# Patient Record
Sex: Female | Born: 1970 | Race: White | Hispanic: No | Marital: Married | Smoking: Current every day smoker
Health system: Southern US, Community
[De-identification: ages and names within clinical notes are randomized; demographics above are authoritative.]

## PROBLEM LIST (undated history)

## (undated) DIAGNOSIS — I2510 Atherosclerotic heart disease of native coronary artery without angina pectoris: Secondary | ICD-10-CM

## (undated) DIAGNOSIS — C801 Malignant (primary) neoplasm, unspecified: Secondary | ICD-10-CM

## (undated) DIAGNOSIS — J45909 Unspecified asthma, uncomplicated: Secondary | ICD-10-CM

## (undated) DIAGNOSIS — C50419 Malignant neoplasm of upper-outer quadrant of unspecified female breast: Secondary | ICD-10-CM

## (undated) DIAGNOSIS — I10 Essential (primary) hypertension: Secondary | ICD-10-CM

## (undated) DIAGNOSIS — N63 Unspecified lump in unspecified breast: Secondary | ICD-10-CM

## (undated) DIAGNOSIS — I219 Acute myocardial infarction, unspecified: Secondary | ICD-10-CM

## (undated) DIAGNOSIS — C50919 Malignant neoplasm of unspecified site of unspecified female breast: Secondary | ICD-10-CM

## (undated) DIAGNOSIS — Z87891 Personal history of nicotine dependence: Secondary | ICD-10-CM

## (undated) HISTORY — PX: CORONARY ANGIOPLASTY WITH STENT PLACEMENT: SHX49

## (undated) HISTORY — DX: Personal history of nicotine dependence: Z87.891

## (undated) HISTORY — PX: PORTACATH PLACEMENT: SHX2246

## (undated) HISTORY — DX: Malignant neoplasm of upper-outer quadrant of unspecified female breast: C50.419

## (undated) HISTORY — DX: Acute myocardial infarction, unspecified: I21.9

## (undated) HISTORY — DX: Atherosclerotic heart disease of native coronary artery without angina pectoris: I25.10

## (undated) HISTORY — DX: Essential (primary) hypertension: I10

## (undated) HISTORY — DX: Unspecified asthma, uncomplicated: J45.909

## (undated) HISTORY — DX: Malignant neoplasm of unspecified site of unspecified female breast: C50.919

## (undated) HISTORY — DX: Unspecified lump in unspecified breast: N63.0

---
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
## 2002-11-08 HISTORY — PX: TUBAL LIGATION: SHX77

## 2003-11-09 DIAGNOSIS — J45909 Unspecified asthma, uncomplicated: Secondary | ICD-10-CM

## 2003-11-09 HISTORY — DX: Unspecified asthma, uncomplicated: J45.909

## 2010-11-08 DIAGNOSIS — C801 Malignant (primary) neoplasm, unspecified: Secondary | ICD-10-CM

## 2010-11-08 DIAGNOSIS — I10 Essential (primary) hypertension: Secondary | ICD-10-CM

## 2010-11-08 DIAGNOSIS — C50419 Malignant neoplasm of upper-outer quadrant of unspecified female breast: Secondary | ICD-10-CM

## 2010-11-08 DIAGNOSIS — N63 Unspecified lump in unspecified breast: Secondary | ICD-10-CM

## 2010-11-08 HISTORY — DX: Malignant neoplasm of upper-outer quadrant of unspecified female breast: C50.419

## 2010-11-08 HISTORY — DX: Unspecified lump in unspecified breast: N63.0

## 2010-11-08 HISTORY — PX: BREAST LUMPECTOMY: SHX2

## 2010-11-08 HISTORY — DX: Essential (primary) hypertension: I10

## 2010-11-08 HISTORY — DX: Malignant (primary) neoplasm, unspecified: C80.1

## 2010-12-23 ENCOUNTER — Ambulatory Visit: Payer: Self-pay

## 2010-12-31 ENCOUNTER — Other Ambulatory Visit: Payer: Self-pay | Admitting: General Surgery

## 2011-01-04 ENCOUNTER — Ambulatory Visit: Payer: Self-pay | Admitting: Oncology

## 2011-01-04 LAB — PATHOLOGY REPORT

## 2011-01-07 ENCOUNTER — Ambulatory Visit: Payer: Self-pay | Admitting: Oncology

## 2011-01-07 ENCOUNTER — Other Ambulatory Visit: Payer: Self-pay | Admitting: *Deleted

## 2011-01-07 ENCOUNTER — Other Ambulatory Visit: Payer: Self-pay | Admitting: Oncology

## 2011-01-07 DIAGNOSIS — C50911 Malignant neoplasm of unspecified site of right female breast: Secondary | ICD-10-CM

## 2011-01-12 ENCOUNTER — Ambulatory Visit
Admission: RE | Admit: 2011-01-12 | Discharge: 2011-01-12 | Disposition: A | Discharge status: Home / Self Care | Payer: Self-pay | Source: Ambulatory Visit | Attending: Oncology | Admitting: Oncology

## 2011-01-12 DIAGNOSIS — C50911 Malignant neoplasm of unspecified site of right female breast: Secondary | ICD-10-CM

## 2011-01-12 MED ORDER — GADOBENATE DIMEGLUMINE 529 MG/ML IV SOLN
11.0000 mL | Freq: Once | INTRAVENOUS | Status: AC | PRN
  Administered 2011-01-12: 11 mL via INTRAVENOUS

## 2011-01-20 ENCOUNTER — Ambulatory Visit: Payer: Self-pay | Admitting: General Surgery

## 2011-02-07 ENCOUNTER — Ambulatory Visit: Payer: Self-pay | Admitting: Oncology

## 2011-03-09 ENCOUNTER — Ambulatory Visit: Payer: Self-pay | Admitting: Oncology

## 2011-04-09 ENCOUNTER — Ambulatory Visit: Payer: Self-pay | Admitting: Oncology

## 2011-05-09 ENCOUNTER — Ambulatory Visit: Payer: Self-pay | Admitting: Oncology

## 2011-06-09 ENCOUNTER — Ambulatory Visit: Payer: Self-pay | Admitting: Oncology

## 2011-06-14 ENCOUNTER — Ambulatory Visit: Payer: Self-pay | Admitting: Oncology

## 2011-06-28 ENCOUNTER — Ambulatory Visit: Payer: Self-pay | Admitting: General Surgery

## 2011-07-06 LAB — PATHOLOGY REPORT

## 2011-07-10 ENCOUNTER — Ambulatory Visit: Payer: Self-pay | Admitting: Oncology

## 2011-08-09 ENCOUNTER — Ambulatory Visit: Payer: Self-pay | Admitting: Oncology

## 2011-09-09 ENCOUNTER — Ambulatory Visit: Payer: Self-pay | Admitting: Oncology

## 2011-10-09 ENCOUNTER — Ambulatory Visit: Payer: Self-pay | Admitting: Oncology

## 2011-11-09 ENCOUNTER — Ambulatory Visit: Payer: Self-pay | Admitting: Oncology

## 2011-12-15 ENCOUNTER — Ambulatory Visit: Payer: Self-pay

## 2012-04-21 ENCOUNTER — Ambulatory Visit: Payer: Self-pay | Admitting: Oncology

## 2012-05-08 ENCOUNTER — Ambulatory Visit: Payer: Self-pay | Admitting: Oncology

## 2012-06-20 ENCOUNTER — Ambulatory Visit: Payer: Self-pay | Admitting: General Surgery

## 2012-08-08 ENCOUNTER — Ambulatory Visit: Payer: Self-pay | Admitting: Oncology

## 2012-10-08 DIAGNOSIS — I219 Acute myocardial infarction, unspecified: Secondary | ICD-10-CM | POA: Insufficient documentation

## 2012-10-08 HISTORY — DX: Acute myocardial infarction, unspecified: I21.9

## 2012-10-16 ENCOUNTER — Emergency Department (HOSPITAL_COMMUNITY): Payer: Medicaid Other

## 2012-10-16 ENCOUNTER — Ambulatory Visit (HOSPITAL_COMMUNITY): Admit: 2012-10-16 | Payer: Self-pay | Admitting: Cardiology

## 2012-10-16 ENCOUNTER — Encounter (HOSPITAL_COMMUNITY): Payer: Self-pay

## 2012-10-16 ENCOUNTER — Encounter (HOSPITAL_COMMUNITY)
Admission: EM | Disposition: A | Discharge status: Home / Self Care | Payer: Self-pay | Source: Home / Self Care | Attending: Cardiology

## 2012-10-16 ENCOUNTER — Inpatient Hospital Stay (HOSPITAL_COMMUNITY)
Admission: EM | Admit: 2012-10-16 | Discharge: 2012-10-19 | DRG: 247 | Disposition: A | Discharge status: Home / Self Care | Payer: Medicaid Other | Attending: Cardiology | Admitting: Cardiology

## 2012-10-16 DIAGNOSIS — E7800 Pure hypercholesterolemia, unspecified: Secondary | ICD-10-CM | POA: Diagnosis present

## 2012-10-16 DIAGNOSIS — Y840 Cardiac catheterization as the cause of abnormal reaction of the patient, or of later complication, without mention of misadventure at the time of the procedure: Secondary | ICD-10-CM | POA: Diagnosis not present

## 2012-10-16 DIAGNOSIS — F17200 Nicotine dependence, unspecified, uncomplicated: Secondary | ICD-10-CM | POA: Diagnosis present

## 2012-10-16 DIAGNOSIS — R000 Tachycardia, unspecified: Secondary | ICD-10-CM | POA: Diagnosis present

## 2012-10-16 DIAGNOSIS — I2109 ST elevation (STEMI) myocardial infarction involving other coronary artery of anterior wall: Principal | ICD-10-CM | POA: Diagnosis present

## 2012-10-16 DIAGNOSIS — Z853 Personal history of malignant neoplasm of breast: Secondary | ICD-10-CM

## 2012-10-16 DIAGNOSIS — I10 Essential (primary) hypertension: Secondary | ICD-10-CM | POA: Diagnosis present

## 2012-10-16 DIAGNOSIS — I959 Hypotension, unspecified: Secondary | ICD-10-CM | POA: Diagnosis present

## 2012-10-16 DIAGNOSIS — Z880 Allergy status to penicillin: Secondary | ICD-10-CM

## 2012-10-16 DIAGNOSIS — I219 Acute myocardial infarction, unspecified: Secondary | ICD-10-CM

## 2012-10-16 DIAGNOSIS — D62 Acute posthemorrhagic anemia: Secondary | ICD-10-CM | POA: Diagnosis not present

## 2012-10-16 DIAGNOSIS — Z955 Presence of coronary angioplasty implant and graft: Secondary | ICD-10-CM

## 2012-10-16 DIAGNOSIS — Y9210 Unspecified residential institution as the place of occurrence of the external cause: Secondary | ICD-10-CM | POA: Diagnosis not present

## 2012-10-16 DIAGNOSIS — IMO0002 Reserved for concepts with insufficient information to code with codable children: Secondary | ICD-10-CM | POA: Diagnosis not present

## 2012-10-16 DIAGNOSIS — I213 ST elevation (STEMI) myocardial infarction of unspecified site: Secondary | ICD-10-CM

## 2012-10-16 DIAGNOSIS — E119 Type 2 diabetes mellitus without complications: Secondary | ICD-10-CM | POA: Diagnosis present

## 2012-10-16 HISTORY — DX: Malignant (primary) neoplasm, unspecified: C80.1

## 2012-10-16 HISTORY — PX: LEFT HEART CATHETERIZATION WITH CORONARY ANGIOGRAM: SHX5451

## 2012-10-16 LAB — BASIC METABOLIC PANEL
BUN: 15 mg/dL (ref 6–23)
Calcium: 7.7 mg/dL — ABNORMAL LOW (ref 8.4–10.5)
Chloride: 98 mEq/L (ref 96–112)
GFR calc Af Amer: 85 mL/min — ABNORMAL LOW (ref >90)
GFR calc Af Amer: >90 mL/min (ref >90)
GFR calc non Af Amer: 73 mL/min — ABNORMAL LOW (ref >90)
GFR calc non Af Amer: >90 mL/min (ref >90)
Glucose, Bld: 161 mg/dL — ABNORMAL HIGH (ref 70–99)
Glucose, Bld: 231 mg/dL — ABNORMAL HIGH (ref 70–99)
Potassium: 3.7 mEq/L (ref 3.5–5.1)
Potassium: 4.4 mEq/L (ref 3.5–5.1)
Sodium: 139 mEq/L (ref 135–145)
Sodium: 137 mEq/L (ref 135–145)

## 2012-10-16 LAB — CBC
Hemoglobin: 10.6 g/dL — ABNORMAL LOW (ref 12.0–15.0)
MCH: 32.6 pg (ref 26.0–34.0)
MCHC: 34.6 g/dL (ref 30.0–36.0)
RDW: 14.2 % (ref 11.5–15.5)

## 2012-10-16 LAB — CBC WITH DIFFERENTIAL/PLATELET
Basophils Absolute: 0 10*3/uL (ref 0.0–0.1)
Basophils Relative: 0 % (ref 0–1)
Basophils Relative: 0 % (ref 0–1)
Eosinophils Absolute: 0 10*3/uL (ref 0.0–0.7)
Eosinophils Absolute: 0 10*3/uL (ref 0.0–0.7)
Lymphs Abs: 1.6 10*3/uL (ref 0.7–4.0)
MCH: 32.8 pg (ref 26.0–34.0)
MCH: 32.2 pg (ref 26.0–34.0)
MCHC: 35.2 g/dL (ref 30.0–36.0)
Monocytes Relative: 5 % (ref 3–12)
Neutro Abs: 11.5 10*3/uL — ABNORMAL HIGH (ref 1.7–7.7)
Neutro Abs: 12.8 10*3/uL — ABNORMAL HIGH (ref 1.7–7.7)
Neutrophils Relative %: 82 % — ABNORMAL HIGH (ref 43–77)
Neutrophils Relative %: 86 % — ABNORMAL HIGH (ref 43–77)
Platelets: 277 10*3/uL (ref 150–400)
Platelets: 288 10*3/uL (ref 150–400)
RBC: 5.06 MIL/uL (ref 3.87–5.11)
RDW: 13.9 % (ref 11.5–15.5)

## 2012-10-16 LAB — TROPONIN I
Troponin I: >20 ng/mL (ref <0.30)
Troponin I: >20 ng/mL (ref <0.30)

## 2012-10-16 LAB — POCT ACTIVATED CLOTTING TIME: Activated Clotting Time: 404 seconds

## 2012-10-16 LAB — MAGNESIUM: Magnesium: 2 mg/dL (ref 1.5–2.5)

## 2012-10-16 LAB — PROTIME-INR
INR: 1.47 (ref 0.00–1.49)
Prothrombin Time: 17.4 seconds — ABNORMAL HIGH (ref 11.6–15.2)

## 2012-10-16 LAB — POCT I-STAT TROPONIN I: Troponin i, poc: 1.91 ng/mL (ref 0.00–0.08)

## 2012-10-16 LAB — APTT: aPTT: 84 seconds — ABNORMAL HIGH (ref 24–37)

## 2012-10-16 SURGERY — LEFT HEART CATHETERIZATION WITH CORONARY ANGIOGRAM
Anesthesia: LOCAL

## 2012-10-16 MED ORDER — LIDOCAINE HCL (PF) 1 % IJ SOLN
INTRAMUSCULAR | Status: AC
  Filled 2012-10-16: qty 30

## 2012-10-16 MED ORDER — ONDANSETRON HCL 4 MG/2ML IJ SOLN
INTRAMUSCULAR | Status: AC
  Filled 2012-10-16: qty 2

## 2012-10-16 MED ORDER — MORPHINE SULFATE 4 MG/ML IJ SOLN
4.0000 mg | Freq: Once | INTRAMUSCULAR | Status: AC
  Administered 2012-10-16: 4 mg via INTRAVENOUS

## 2012-10-16 MED ORDER — METOPROLOL TARTRATE 50 MG PO TABS
50.0000 mg | ORAL_TABLET | Freq: Once | ORAL | Status: AC
  Administered 2012-10-16: 50 mg via ORAL
  Filled 2012-10-16: qty 1

## 2012-10-16 MED ORDER — ASPIRIN 81 MG PO CHEW
CHEWABLE_TABLET | ORAL | Status: AC
  Administered 2012-10-16: 324 mg
  Filled 2012-10-16: qty 4

## 2012-10-16 MED ORDER — INSULIN ASPART 100 UNIT/ML ~~LOC~~ SOLN: 0.0000 [IU] | Freq: Three times a day (TID) | SUBCUTANEOUS | Status: DC

## 2012-10-16 MED ORDER — FENTANYL CITRATE 0.05 MG/ML IJ SOLN
INTRAMUSCULAR | Status: AC
  Filled 2012-10-16: qty 2

## 2012-10-16 MED ORDER — METOPROLOL TARTRATE 1 MG/ML IV SOLN
5.0000 mg | Freq: Once | INTRAVENOUS | Status: AC
  Administered 2012-10-16: 5 mg via INTRAVENOUS
  Filled 2012-10-16: qty 5

## 2012-10-16 MED ORDER — NITROGLYCERIN IN D5W 200-5 MCG/ML-% IV SOLN: 5.0000 ug/min | INTRAVENOUS | Status: DC

## 2012-10-16 MED ORDER — TICAGRELOR 90 MG PO TABS
ORAL_TABLET | ORAL | Status: AC
  Administered 2012-10-17: 90 mg via ORAL
  Filled 2012-10-16: qty 2

## 2012-10-16 MED ORDER — PANTOPRAZOLE SODIUM 40 MG PO TBEC
40.0000 mg | DELAYED_RELEASE_TABLET | Freq: Every day | ORAL | Status: DC
  Administered 2012-10-17 – 2012-10-19 (×3): 40 mg via ORAL
  Filled 2012-10-16 (×3): qty 1

## 2012-10-16 MED ORDER — HEPARIN (PORCINE) IN NACL 2-0.9 UNIT/ML-% IJ SOLN
INTRAMUSCULAR | Status: AC
  Filled 2012-10-16: qty 1000

## 2012-10-16 MED ORDER — HEPARIN (PORCINE) IN NACL 100-0.45 UNIT/ML-% IJ SOLN
1000.0000 [IU]/h | INTRAMUSCULAR | Status: DC
  Administered 2012-10-16: 1000 [IU]/h via INTRAVENOUS
  Filled 2012-10-16: qty 250

## 2012-10-16 MED ORDER — TICAGRELOR 90 MG PO TABS
90.0000 mg | ORAL_TABLET | Freq: Two times a day (BID) | ORAL | Status: DC
  Administered 2012-10-16 – 2012-10-19 (×6): 90 mg via ORAL
  Filled 2012-10-16 (×7): qty 1

## 2012-10-16 MED ORDER — METOPROLOL TARTRATE 12.5 MG HALF TABLET
12.5000 mg | ORAL_TABLET | Freq: Two times a day (BID) | ORAL | Status: DC
  Administered 2012-10-16 – 2012-10-19 (×6): 12.5 mg via ORAL
  Filled 2012-10-16 (×8): qty 1

## 2012-10-16 MED ORDER — SODIUM CHLORIDE 0.9 % IV BOLUS (SEPSIS)
500.0000 mL | Freq: Once | INTRAVENOUS | Status: AC
  Administered 2012-10-16: 500 mL via INTRAVENOUS

## 2012-10-16 MED ORDER — VANCOMYCIN HCL IN DEXTROSE 1-5 GM/200ML-% IV SOLN
1000.0000 mg | Freq: Once | INTRAVENOUS | Status: AC
  Administered 2012-10-16: 1000 mg via INTRAVENOUS
  Filled 2012-10-16: qty 200

## 2012-10-16 MED ORDER — ONDANSETRON HCL 4 MG/2ML IJ SOLN: 4.0000 mg | Freq: Four times a day (QID) | INTRAMUSCULAR | Status: DC | PRN

## 2012-10-16 MED ORDER — SODIUM CHLORIDE 0.9 % IV SOLN
Freq: Once | INTRAVENOUS | Status: AC
  Administered 2012-10-16: 1000 mL via INTRAVENOUS

## 2012-10-16 MED ORDER — ACETAMINOPHEN 325 MG PO TABS: 650.0000 mg | ORAL_TABLET | ORAL | Status: DC | PRN

## 2012-10-16 MED ORDER — ASPIRIN 325 MG PO TABS: 324.0000 mg | ORAL_TABLET | Freq: Once | ORAL | Status: DC

## 2012-10-16 MED ORDER — PROMETHAZINE HCL 25 MG/ML IJ SOLN
25.0000 mg | Freq: Once | INTRAMUSCULAR | Status: AC
  Administered 2012-10-16 (×2): 12.5 mg via INTRAVENOUS

## 2012-10-16 MED ORDER — SODIUM CHLORIDE 0.9 % IV SOLN
INTRAVENOUS | Status: AC
  Administered 2012-10-16: 150 mL/h via INTRAVENOUS

## 2012-10-16 MED ORDER — MORPHINE SULFATE 2 MG/ML IJ SOLN
INTRAMUSCULAR | Status: AC
  Filled 2012-10-16: qty 1

## 2012-10-16 MED ORDER — HEPARIN BOLUS VIA INFUSION
4000.0000 [IU] | Freq: Once | INTRAVENOUS | Status: AC
  Administered 2012-10-16: 4000 [IU] via INTRAVENOUS

## 2012-10-16 MED ORDER — MORPHINE SULFATE 2 MG/ML IJ SOLN
2.0000 mg | INTRAMUSCULAR | Status: DC | PRN
  Administered 2012-10-16: 2 mg via INTRAVENOUS

## 2012-10-16 MED ORDER — EPTIFIBATIDE 75 MG/100ML IV SOLN
INTRAVENOUS | Status: AC
  Filled 2012-10-16: qty 100

## 2012-10-16 MED ORDER — PROMETHAZINE HCL 25 MG/ML IJ SOLN: 12.5000 mg | Freq: Three times a day (TID) | INTRAMUSCULAR | Status: DC | PRN

## 2012-10-16 MED ORDER — ATORVASTATIN CALCIUM 80 MG PO TABS
80.0000 mg | ORAL_TABLET | Freq: Every day | ORAL | Status: DC
  Administered 2012-10-16 – 2012-10-18 (×3): 80 mg via ORAL
  Filled 2012-10-16 (×4): qty 1

## 2012-10-16 MED ORDER — MORPHINE SULFATE 4 MG/ML IJ SOLN
INTRAMUSCULAR | Status: AC
  Administered 2012-10-16: 4 mg via INTRAVENOUS
  Filled 2012-10-16: qty 1

## 2012-10-16 MED ORDER — NITROGLYCERIN IN D5W 200-5 MCG/ML-% IV SOLN
5.0000 ug/min | INTRAVENOUS | Status: DC
  Administered 2012-10-16: 5 ug/min via INTRAVENOUS

## 2012-10-16 MED ORDER — CLOPIDOGREL BISULFATE 300 MG PO TABS
300.0000 mg | ORAL_TABLET | Freq: Once | ORAL | Status: AC
  Administered 2012-10-16: 300 mg via ORAL
  Filled 2012-10-16: qty 1

## 2012-10-16 MED ORDER — METOPROLOL TARTRATE 1 MG/ML IV SOLN
5.0000 mg | INTRAVENOUS | Status: AC
  Administered 2012-10-16 (×2): 2.5 mg via INTRAVENOUS
  Filled 2012-10-16: qty 5

## 2012-10-16 MED ORDER — NITROGLYCERIN 0.2 MG/ML ON CALL CATH LAB
INTRAVENOUS | Status: AC
  Filled 2012-10-16: qty 1

## 2012-10-16 MED ORDER — NITROGLYCERIN IN D5W 200-5 MCG/ML-% IV SOLN
INTRAVENOUS | Status: AC
  Filled 2012-10-16: qty 250

## 2012-10-16 MED ORDER — BIVALIRUDIN 250 MG IV SOLR
INTRAVENOUS | Status: AC
  Filled 2012-10-16: qty 250

## 2012-10-16 MED ORDER — ALPRAZOLAM 0.25 MG PO TABS
0.2500 mg | ORAL_TABLET | Freq: Two times a day (BID) | ORAL | Status: DC | PRN
  Administered 2012-10-17 – 2012-10-18 (×3): 0.25 mg via ORAL
  Filled 2012-10-16 (×3): qty 1

## 2012-10-16 MED ORDER — SODIUM CHLORIDE 0.9 % IV SOLN
Freq: Once | INTRAVENOUS | Status: AC
  Administered 2012-10-16: 13:00:00 via INTRAVENOUS

## 2012-10-16 MED ORDER — NITROGLYCERIN 0.4 MG SL SUBL: 0.4000 mg | SUBLINGUAL_TABLET | SUBLINGUAL | Status: DC | PRN

## 2012-10-16 MED ORDER — SODIUM CHLORIDE 0.9 % IV BOLUS (SEPSIS)
250.0000 mL | Freq: Once | INTRAVENOUS | Status: AC
  Administered 2012-10-16: 250 mL via INTRAVENOUS

## 2012-10-16 MED ORDER — INSULIN ASPART 100 UNIT/ML ~~LOC~~ SOLN: 0.0000 [IU] | Freq: Every day | SUBCUTANEOUS | Status: DC

## 2012-10-16 MED ORDER — ASPIRIN EC 81 MG PO TBEC
81.0000 mg | DELAYED_RELEASE_TABLET | Freq: Every day | ORAL | Status: DC
  Administered 2012-10-17 – 2012-10-19 (×3): 81 mg via ORAL
  Filled 2012-10-16 (×3): qty 1

## 2012-10-16 MED ORDER — MIDAZOLAM HCL 2 MG/2ML IJ SOLN
INTRAMUSCULAR | Status: AC
  Filled 2012-10-16: qty 2

## 2012-10-16 NOTE — Progress Notes (Signed)
   CARE MANAGEMENT NOTE 10/16/2012  Patient:  [name],[name]   Account Number:  [id number]  Date Initiated:  10/16/2012  Documentation initiated by:  Junius Creamer  Subjective/Objective Assessment:   adm w mi     Action/Plan:   lives w spouse   Anticipated DC Date:     Anticipated DC Plan:        DC Planning Services  CM consult      Choice offered to / List presented to:             Status of service:   Medicare Important Message given?   (If response is "NO", the following Medicare IM given date fields will be blank) Date Medicare IM given:   Date Additional Medicare IM given:    Discharge Disposition:  HOME/SELF CARE  Per UR Regulation:  Reviewed for med. necessity/level of care/duration of stay  If discussed at Long Length of Stay Meetings, dates discussed:    Comments:  12/9 12:21p debbie Sweetie Giebler rn,bsn [phone] will give pt brilinta 30day free card and copay assist card.

## 2012-10-16 NOTE — Progress Notes (Signed)
Order for sheath removal verified per post procedural orders. Procedure explained to patient and Rt femoral artery access site assessed: level 0, palpable dorsalis pedis and posterior tibial pulses. 6 French Sheath removed and manual pressure applied for 40 minutes. Pre, peri, & post procedural vitals: HR 120's, RR 14, O2 Sat upper 96, BP 122/70: Pt complains of right flank and abdominal pain. Dr. Sharyn Lull at [facility]. Distal pulses remained intact after sheath removal. Access site level 0 and dressed with 4X4 gauze and tegaderm.  Solmon Ice, RN confirmed condition of site. Post procedural instructions discussed with return demonstration from patient.

## 2012-10-16 NOTE — ED Notes (Signed)
Critical labs called to rn, md notified ([email or web address]). Cath lab also notified. Reported to [place]

## 2012-10-16 NOTE — ED Provider Notes (Signed)
History   This chart was scribed for Karen Lyons, MD by [name], ED Scribe. The patient was seen in room APA08/APA08. Patient's care was started at 0911.   CSN: [id number]  Arrival date & time 10/16/12  0900   First MD Initiated Contact with Patient 10/16/12 0911      Chief Complaint  Patient presents with  . Chest Pain    The history is provided by the patient. No language interpreter was used.  [name] is a 41 y.o. female who presents to the Emergency Department complaining of intermittent, moderate chest pain that started 2 days that worsened over the past 2 hours. She states the pain woke her up from her sleep. She describes the chest pain as pressure and radiates to her left arm. She reports associated nausea, vomiting, and SOB. She has a h/o breast cancer with her last chemotherapy treatment a year ago. She denies any cardiac hx. She denies any h/o DM, HTN.  Past Medical History  Diagnosis Date  . Cancer     breast cancer    Past Surgical History  Procedure Date  . Breast lumpectomy   . Portacath placement   . Cesarean section     No family history on file.  History  Substance Use Topics  . Smoking status: Current Every Day Smoker  . Smokeless tobacco: Not on file  . Alcohol Use: No    OB History    Grav Para Term Preterm Abortions TAB SAB Ect Mult Living                  Review of Systems  Respiratory: Positive for shortness of breath.   Cardiovascular: Positive for chest pain.  Gastrointestinal: Positive for nausea and vomiting.  All other systems reviewed and are negative.    Allergies  Codeine and Penicillins  Home Medications  No current outpatient prescriptions on file.  BP 180/125  Pulse 95  Temp 98.1 F (36.7 C) (Axillary)  Resp 22  Ht 4\' 10"  (1.473 m)  Wt 125 lb (56.7 kg)  BMI 26.13 kg/m2  SpO2 100%  Physical Exam  Nursing note and vitals reviewed. Constitutional: She is oriented to person, place, and time. She  appears well-developed and well-nourished. She appears distressed.       Appears anxious and uncomfortable.   HENT:  Head: Normocephalic and atraumatic.  Eyes: EOM are normal. Pupils are equal, round, and reactive to light.  Neck: Normal range of motion. Neck supple. No tracheal deviation present.  Cardiovascular: Regular rhythm and normal heart sounds.  Tachycardia present.   No murmur heard. Pulmonary/Chest: Effort normal and breath sounds normal. No respiratory distress. She has no wheezes.  Abdominal: Soft. Bowel sounds are normal. She exhibits no distension. There is no tenderness.  Musculoskeletal: Normal range of motion. She exhibits no edema.  Neurological: She is alert and oriented to person, place, and time.  Skin: Skin is warm and dry.  Psychiatric: She has a normal mood and affect. Her behavior is normal.    ED Course  Procedures (including critical care time)  DIAGNOSTIC STUDIES: Oxygen Saturation is 100% on 2 L [place], normal by my interpretation.    COORDINATION OF CARE:  09:15-Informed pt of EKG findings, called code STEMI. Discussed planned course of treatment with the patient including aspirin, nitroglycerin, heparin and transferring to [facility], who is agreeable at this time.   09:22-Medication Orders: Nitroglycerin 0.2 mg/mL in dextrose 5% infusion; Aspirin 81 mg chewable tablet.  Labs Reviewed  CBC WITH DIFFERENTIAL  BASIC METABOLIC PANEL  TROPONIN I   No results found.   No diagnosis found.   Date: 10/16/2012  Rate: 106  Rhythm: sinus tachycardia  QRS Axis: left  Intervals: normal  ST/T Wave abnormalities: acute myocardial infarction  Conduction Disutrbances:none  Narrative Interpretation:   Old EKG Reviewed: none available    MDM  The patient presents with chest pain since last night, ekg this AM consistent with anteroseptal MI.  Code STEMI called and heparin, plavix, asa, oxygen, morphine, ntg drip, lopressor all given.  EMS called to take  patient to [facility].  I have spoken with Dr. Sharyn Lull who agrees to accept the patient in transfer.  He has recommended brylinta but I have been informed we do not have this here.  Plavix given in lieu of this.   CRITICAL CARE Performed by: [name]   Total critical care time: 30 minutes  Critical care time was exclusive of separately billable procedures and treating other patients.  Critical care was necessary to treat or prevent imminent or life-threatening deterioration.  Critical care was time spent personally by me on the following activities: development of treatment plan with patient and/or surrogate as well as nursing, discussions with consultants, evaluation of patient's response to treatment, examination of patient, obtaining history from patient or surrogate, ordering and performing treatments and interventions, ordering and review of laboratory studies, ordering and review of radiographic studies, pulse oximetry and re-evaluation of patient's condition.    I personally performed the services described in this documentation, which was scribed in my presence. The recorded information has been reviewed and is accurate.         Karen Lyons, MD 10/16/12 [phone]

## 2012-10-16 NOTE — ED Notes (Signed)
MD at bedside. 

## 2012-10-16 NOTE — CV Procedure (Signed)
Left cardiac cath/PTCA stenting report dictated on 10/16/2012 dictation number is [id number]

## 2012-10-16 NOTE — ED Notes (Signed)
[facility] called for Code Stemi.

## 2012-10-16 NOTE — ED Notes (Signed)
Report called to aleshia at cath lab.

## 2012-10-16 NOTE — Progress Notes (Signed)
Orthopedic Tech Progress Note Patient Details:  [name] 01-12-1971 [id number]  Ortho Devices Type of Ortho Device: Knee Immobilizer Ortho Device/Splint Interventions: Casandra Doffing 10/16/2012, 2:36 PM

## 2012-10-16 NOTE — ED Notes (Signed)
Pt reports has had intermittent chest pain x 2 days but for the past 2 hours pain has been severe.  Reports pain woke her from sleep.  Describes as pressure and radiates to left arm.  C/O sob and n/v.

## 2012-10-16 NOTE — Progress Notes (Signed)
MD ordered for foley to be inserted for accurate urine output & for pt immobilization; foley huddle done; peri care complete before sterile foley insertion; clear yellow urine return

## 2012-10-16 NOTE — Cardiovascular Report (Signed)
[name], [name]                 ACCOUNT NO.:  [id number]  MEDICAL RECORD NO.:  [id number]  LOCATION:  2904                         FACILITY:  [facility]  PHYSICIAN:  Eduardo Osier. Sharyn Lull, M.D. DATE OF BIRTH:  29-Dec-1970  DATE OF PROCEDURE:  10/16/2012 DATE OF DISCHARGE:                           CARDIAC CATHETERIZATION   PROCEDURES: 1. Left cardiac catheterization with selective left and right coronary     angiography, left ventriculography via right groin using Judkins     technique. 2. Successful percutaneous transluminal coronary angioplasty to the     mid-left anterior descending artery using 2.0 x 12-mm long Mini-     Trek balloon. 3. Successful deployment of 2.75 x 23-mm long Xience Xpedition drug-     eluting stent in mid-left anterior descending artery. 4. Successful postdilatation of this stent using 2.75 x 15-mm long [place]     Trek balloon.  INDICATION FOR THE PROCEDURE:  [name] is 41 year old female with past medical history significant for cancer of breast, tobacco abuse, who was transferred from [facility] as code STEMI was called.  The patient complaints of recurrent retrosternal chest pain described as pressure, grade 6-10/10, off and on since yesterday radiating to left arm associated with nausea, vomiting, and mild shortness of breath.  The patient initially did not seek medical attention, but as chest pain got worse today, drove to the ER at [facility].  The patient denies such episodes in the past.  Denies any palpitation, lightheadedness, or syncope.  EKG done in the ER showed sinus tachycardia with ST elevation and Q-wave in anterolateral leads.  The patient was emergently transferred to [facility] for emergency PCI.  PROCEDURE:  After obtaining the informed consent, the patient was directly brought to the Cath Lab and was placed on fluoroscopy table. Right groin was prepped and draped in usual fashion.  Xylocaine 1% was used for local  anesthesia in the right groin.  With the help of thin wall needle, 6-French arterial sheath was placed.  The sheath was aspirated and flushed.  Next, 6-French right Judkins catheter was advanced over the wire under fluoroscopic guidance up to the ascending aorta.  Wire was pulled out, the catheter was aspirated and connected to the Manifold.  Catheter was further advanced and engaged into right coronary ostium.  A single view of right coronary artery was obtained. Next, catheter was disengaged and was pulled out over the wire and was replaced with 6-French XB LAD 3.5 guiding catheter, which was advanced over the wire under fluoroscopic guidance up to the ascending aorta. Wire was pulled out, the catheter was aspirated and connected to the Manifold.  Catheter was further advanced and engaged into left coronary ostium.  Multiple views of the left system were taken.  Next, the catheter was disengaged and was pulled out over the wire at the end of the procedure and was replaced with 6-French pigtail catheter, which was advanced over the wire under fluoroscopic guidance up to the ascending aorta.  Wire was pulled out, the catheter was aspirated and connected to the Manifold.  Catheter was further advanced across the aortic valve into the LV.  LV pressures  were recorded.  Left ventriculography was done in 30-degree RAO position.  Post- angiographic pressures were recorded from LV and pullback pressures were recorded from the aorta.  There was no gradient across the aortic valve. Next, the pigtail catheter was pulled out over the wire, sheaths were aspirated and flushed.  FINDINGS:  LV showed severe anterolateral wall hypokinesia, EF of 35- 40%.  Left main was patent.  LAD has subtotal occlusion with ruptured plaque and thrombus in the midportion with TIMI 2 flow.  Diagonal 1 was large, which was patent.  Diagonal 2 was small, which was patent.  Left circumflex has 25-30% proximal and  mid-sequential stenosis.  OM 1 was small, which was patent.  OM 2 and 3 were very very small.  OM 4 was small, which was patent.  OM 5 and PDA were moderate size, which were patent.  RCA was nondominant, which was patent.  INTERVENTIONAL PROCEDURE:  Successful PTCA to mid-LAD was done using 2.2 x 12-mm long Mini-Trek balloon for predilatation and then 2.75 x 23-mm long Xience Xpedition drug-eluting stent was deployed at 10 atmospheric pressure in mid-LAD.  The stent was postdilated using 2.75 x 15-mm long [place] Trek balloon going up to 18 atmospheric pressure.  Lesion dilated from 99% to 0% residual with excellent TIMI grade 3 distal flow without evidence of dissection or distal embolization.  The patient received weight based Angiomax, single bolus Integrilin, 180 mg of Brilinta during the procedure.  The patient tolerated the procedure well.  There were no complications.  The patient was transferred to recovery room in stable condition.     Eduardo Osier. Sharyn Lull, M.D.     [facility]/MEDQ  D:  10/16/2012  T:  10/16/2012  Job:  [id number]

## 2012-10-16 NOTE — Progress Notes (Signed)
10/16/12 1300 10/16/12 1305  Vitals  Temp 97.4 F (36.3 C) --   Temp src Oral --   BP ! 66/50 mmHg ! 71/57 mmHg  MAP (mmHg) 52  60   Pulse Rate ! 119  ! 123   ECG Heart Rate ! 120  ! 123   Resp 18  17   Oxygen Therapy  SpO2 100 % 100 %  O2 Device Nasal cannula Nasal cannula  O2 Flow Rate (L/min) 2 L/min 2 L/min   pt returned from cath lab around 1145, pt WNL, c/o of minimal pain in R groin area; around 1215 - pt began c/o of worst pain 10/10 pain - MD notified - 2mg  morphine ordered & given, 1300- pt's BP dropped,see above,  MD notified and came to bedside; NS bolus(s) ordered; cath lab called to pull sheath; n/v throughout event, see MAR; sheath removed per cath lab at 1415 - see note; pt monitored throughout event; will continue to monitor

## 2012-10-16 NOTE — ED Notes (Signed)
Pt reports cp off/on x2 days, pain became severe and increased in intensity, around 3am today. +sob, +nausea.  Sweating at arrival. Pt placed on all monitors and ekg obtained. md to bedside stat. Code stemi initiated. Family at bedside.  Pt transported by [facility].

## 2012-10-16 NOTE — H&P (Signed)
[name] is an 41 y.o. female.   Chief Complaint: Chest pain HPI: Patient is 41 year old female with past medical history significant for CA of breast tobacco abuse was transferred from [facility] as CODE STEMI was called. Patient complains of recurrent retrosternal chest pain described as pressure grade 6-10 over 10 off and on since yesterday radiating to left arm associated with nausea vomiting and mild shortness of breath patient initially did not seek medical attention but as chest pain got worse today drove to ER. Denies such episodes in the past. Denies any palpitation lightheadedness or syncope EKG done in the ER showed sinus tachycardia with ST elevation in anterolateral leads with Q waves suggestive of anterolateral wall MI. Patient presently complains of chest pain grade 6/10. Patient received aspirin heparin IV morphine and 300 mg off Plavix and Lopressor in ED and was transferred to [facility] Cath Lab for emergency PCI. A  Past Medical History  Diagnosis Date  . Cancer     breast cancer    Past Surgical History  Procedure Date  . Breast lumpectomy   . Portacath placement   . Cesarean section     No family history on file. Social History:  reports that she has been smoking.  She does not have any smokeless tobacco history on file. She reports that she does not drink alcohol or use illicit drugs.  Allergies:  Allergies  Allergen Reactions  . Codeine   . Penicillins     No prescriptions prior to admission    Results for orders placed during the hospital encounter of 10/16/12 (from the past 48 hour(s))  CBC WITH DIFFERENTIAL     Status: Abnormal   Collection Time   10/16/12  9:15 AM      Component Value Range Comment   WBC 14.0 (*) 4.0 - 10.5 K/uL    RBC 5.06  3.87 - 5.11 MIL/uL    Hemoglobin 16.6 (*) 12.0 - 15.0 g/dL    HCT 78.2 (*) 95.6 - 46.0 %    MCV 93.9  78.0 - 100.0 fL    MCH 32.8  26.0 - 34.0 pg    MCHC 34.9  30.0 - 36.0 g/dL    RDW 21.3   08.6 - 57.8 %    Platelets 277  150 - 400 K/uL    Neutrophils Relative 82 (*) 43 - 77 %    Neutro Abs 11.5 (*) 1.7 - 7.7 K/uL    Lymphocytes Relative 11 (*) 12 - 46 %    Lymphs Abs 1.6  0.7 - 4.0 K/uL    Monocytes Relative 6  3 - 12 %    Monocytes Absolute 0.8  0.1 - 1.0 K/uL    Eosinophils Relative 0  0 - 5 %    Eosinophils Absolute 0.0  0.0 - 0.7 K/uL    Basophils Relative 0  0 - 1 %    Basophils Absolute 0.0  0.0 - 0.1 K/uL   BASIC METABOLIC PANEL     Status: Abnormal   Collection Time   10/16/12  9:15 AM      Component Value Range Comment   Sodium 139  135 - 145 mEq/L    Potassium 3.7  3.5 - 5.1 mEq/L    Chloride 98  96 - 112 mEq/L    CO2 24  19 - 32 mEq/L    Glucose, Bld 161 (*) 70 - 99 mg/dL    BUN 12  6 - 23 mg/dL  Creatinine, Ser 0.94  0.50 - 1.10 mg/dL    Calcium 45.4  8.4 - 10.5 mg/dL    GFR calc non Af Amer 73 (*) >90 mL/min    GFR calc Af Amer 85 (*) >90 mL/min   TROPONIN I     Status: Abnormal   Collection Time   10/16/12  9:15 AM      Component Value Range Comment   Troponin I 2.11 (*) <0.30 ng/mL   POCT I-STAT TROPONIN I     Status: Abnormal   Collection Time   10/16/12  9:24 AM      Component Value Range Comment   Troponin i, poc 1.91 (*) 0.00 - 0.08 ng/mL    Comment NOTIFIED PHYSICIAN      Comment 3             Dg Chest Portable 1 View  10/16/2012  *RADIOLOGY REPORT*  Clinical Data: Chest pain.  Vomiting and shortness of breath.  PORTABLE CHEST - 1 VIEW  Comparison: None.  Findings: Artifact overlies chest.  Port is in place from a left subclavian approach with its tip in the SVC just above the right atrium.  The lungs are clear.  The vascularity is normal.  No effusions.  No bony abnormality.  IMPRESSION: No active disease.  Vascular port grossly well positioned.   Original Report Authenticated By: Paulina Fusi, M.D.     Review of Systems  Constitutional: Negative for fever and chills.  Eyes: Negative for blurred vision and double vision.  Respiratory:  Negative for cough, hemoptysis and sputum production.   Cardiovascular: Positive for chest pain. Negative for palpitations, orthopnea, claudication, leg swelling and PND.  Gastrointestinal: Positive for nausea and vomiting. Negative for abdominal pain and diarrhea.  Neurological: Negative for dizziness and headaches.    Blood pressure 180/125, pulse 95, temperature 98.1 F (36.7 C), temperature source Axillary, resp. rate 22, height 4\' 10"  (1.473 m), weight 56.7 kg (125 lb), SpO2 100.00%. Physical Exam  HENT:  Head: Normocephalic and atraumatic.  Eyes: Conjunctivae normal are normal. Pupils are equal, round, and reactive to light. Left eye exhibits no discharge. No scleral icterus.  Neck: Normal range of motion. Neck supple. No JVD present. No tracheal deviation present. No thyromegaly present.  Cardiovascular: Normal rate and regular rhythm.  Exam reveals no friction rub.        Soft systolic murmur and S4 gallop noted  Respiratory: Effort normal and breath sounds normal. No respiratory distress. She has no wheezes.  GI: Soft. Bowel sounds are normal. She exhibits distension. There is no tenderness.  Musculoskeletal:       No clubbing cyanosis edema  Neurological: She is alert. She has normal reflexes.     Assessment/Plan Acute anterolateral wall MI New-onset hypertension Tobacco abuse History of CA of breast  Plan Discussed briefly with patient regarding emergency left cath possible PTCA stenting its risk and benefits i.e. death MI stroke for emergency CABG local last complications etc. and consents for PCI [facility] N 10/16/2012, 11:32 AM

## 2012-10-17 ENCOUNTER — Inpatient Hospital Stay (HOSPITAL_COMMUNITY): Payer: Medicaid Other

## 2012-10-17 LAB — BASIC METABOLIC PANEL
BUN: 8 mg/dL (ref 6–23)
CO2: 22 mEq/L (ref 19–32)
Calcium: 7.8 mg/dL — ABNORMAL LOW (ref 8.4–10.5)
Creatinine, Ser: 0.56 mg/dL (ref 0.50–1.10)
Creatinine, Ser: 0.59 mg/dL (ref 0.50–1.10)
GFR calc Af Amer: >90 mL/min (ref >90)
GFR calc non Af Amer: >90 mL/min (ref >90)
Glucose, Bld: 100 mg/dL — ABNORMAL HIGH (ref 70–99)

## 2012-10-17 LAB — GLUCOSE, CAPILLARY: Glucose-Capillary: 105 mg/dL — ABNORMAL HIGH (ref 70–99)

## 2012-10-17 LAB — CBC
HCT: 22.2 % — ABNORMAL LOW (ref 36.0–46.0)
Hemoglobin: 8.3 g/dL — ABNORMAL LOW (ref 12.0–15.0)
MCH: 31.9 pg (ref 26.0–34.0)
MCHC: 34.7 g/dL (ref 30.0–36.0)
MCV: 92.7 fL (ref 78.0–100.0)
RBC: 2.6 MIL/uL — ABNORMAL LOW (ref 3.87–5.11)
RDW: 14.1 % (ref 11.5–15.5)

## 2012-10-17 LAB — LIPID PANEL
HDL: 25 mg/dL — ABNORMAL LOW (ref >39)
LDL Cholesterol: 99 mg/dL (ref 0–99)
Triglycerides: 112 mg/dL (ref <150)
VLDL: 22 mg/dL (ref 0–40)

## 2012-10-17 LAB — TROPONIN I: Troponin I: >20 ng/mL (ref <0.30)

## 2012-10-17 MED ORDER — RAMIPRIL 2.5 MG PO CAPS
2.5000 mg | ORAL_CAPSULE | Freq: Every day | ORAL | Status: DC
  Administered 2012-10-17 – 2012-10-19 (×3): 2.5 mg via ORAL
  Filled 2012-10-17 (×3): qty 1

## 2012-10-17 MED ORDER — POTASSIUM CHLORIDE CRYS ER 20 MEQ PO TBCR
40.0000 meq | EXTENDED_RELEASE_TABLET | Freq: Once | ORAL | Status: AC
  Administered 2012-10-17: 40 meq via ORAL

## 2012-10-17 MED ORDER — POTASSIUM CHLORIDE CRYS ER 20 MEQ PO TBCR
EXTENDED_RELEASE_TABLET | ORAL | Status: AC
  Filled 2012-10-17: qty 2

## 2012-10-17 MED ORDER — SODIUM CHLORIDE 0.9 % IV SOLN: INTRAVENOUS | Status: DC

## 2012-10-17 MED ORDER — FERROUS SULFATE 325 (65 FE) MG PO TABS
325.0000 mg | ORAL_TABLET | Freq: Three times a day (TID) | ORAL | Status: DC
  Administered 2012-10-17 – 2012-10-19 (×5): 325 mg via ORAL
  Filled 2012-10-17 (×8): qty 1

## 2012-10-17 MED ORDER — SENNOSIDES-DOCUSATE SODIUM 8.6-50 MG PO TABS: 1.0000 | ORAL_TABLET | Freq: Every evening | ORAL | Status: DC | PRN

## 2012-10-17 MED FILL — Dextrose Inj 5%: INTRAVENOUS | Qty: 50 | Status: AC

## 2012-10-17 NOTE — Progress Notes (Signed)
Dr Sharyn Lull notified of Labs and orders given. Also pt is not a diabetic orders to discontinue insulin and CBG checks

## 2012-10-17 NOTE — Progress Notes (Signed)
Subjective:  Patient denies any chest pain or shortness of breath right groin pain is minimal overall feels much better. Had significant drop in hemoglobin probably secondary to blood loss during the procedure hydration and probable retroperitoneal bleed. Remains hemodynamically stable  Objective:  Vital Signs in the last 24 hours: Temp:  [97.4 F (36.3 C)-99.2 F (37.3 C)] 99.2 F (37.3 C) (12/10 0346) Pulse Rate:  [76-137] 92  (12/10 0700) Resp:  [7-22] 7  (12/10 0700) BP: (66-180)/(50-125) 132/67 mmHg (12/10 0700) SpO2:  [95 %-100 %] 100 % (12/10 0700) Weight:  [56.7 kg (125 lb)-59.4 kg (130 lb 15.3 oz)] 59.4 kg (130 lb 15.3 oz) (12/09 1145)  Intake/Output from previous day: 12/09 0701 - 12/10 0700 In: 2494.1 [P.O.:360; I.V.:1934.1; IV Piggyback:200] Out: 880 [Urine:880] Intake/Output from this shift:    Physical Exam: Neck: no adenopathy, no carotid bruit, no JVD and supple, symmetrical, trachea midline Lungs: clear to auscultation bilaterally Heart: regular rate and rhythm, S1, S2 normal and Soft systolic murmur noted Abdomen: Soft bowel sounds present minimal tenderness in right lower quadrant no flank ecchymosis Extremities: extremities normal, atraumatic, no cyanosis or edema and Right groin minimal ecchymosis no hematoma appreciated no bruit appreciated Pulses: 2+ and symmetric  Lab Results:  Basename 10/17/12 0520 10/16/12 2105 10/16/12 1645  WBC 12.3* -- 16.3*  HGB 8.3* -- 10.6*  PLT 177 190 --    Basename 10/17/12 0520 10/16/12 1645  NA 136 137  K 3.5 4.4  CL 105 106  CO2 22 22  GLUCOSE 100* 231*  BUN 10 15  CREATININE 0.56 0.73    Basename 10/17/12 0026 10/16/12 1717  TROPONINI >20.00* >20.00*   Hepatic Function Panel No results found for this basename: PROT,ALBUMIN,AST,ALT,ALKPHOS,BILITOT,BILIDIR,IBILI in the last 72 hours  Basename 10/17/12 0520  CHOL 146   No results found for this basename: PROTIME in the last 72 hours  Imaging: Imaging  results have been reviewed and Dg Chest Portable 1 View  10/16/2012  *RADIOLOGY REPORT*  Clinical Data: Chest pain.  Vomiting and shortness of breath.  PORTABLE CHEST - 1 VIEW  Comparison: None.  Findings: Artifact overlies chest.  Port is in place from a left subclavian approach with its tip in the SVC just above the right atrium.  The lungs are clear.  The vascularity is normal.  No effusions.  No bony abnormality.  IMPRESSION: No active disease.  Vascular port grossly well positioned.   Original Report Authenticated By: Paulina Fusi, M.D.     Cardiac Studies:  Assessment/Plan:  Status post acute anterolateral wall myocardial infarction status post PCI to mid LAD with excellent results Hypertension New-onset diabetes mellitus controlled by diet Tobacco abuse History of CVA of breast Acute anemia multi-factorial i.e. hydration blood loss during procedure and probable retroperitoneal bleed stable Plan DC IV fluids DC Foley  check labs this afternoon May need CT of right groin if further drop in hemoglobin  LOS: 1 day    [name] N 10/17/2012, 8:47 AM

## 2012-10-17 NOTE — Progress Notes (Signed)
[id number] Pt for CT scan due to HGB dropping. Will hold ambulation and begin ed. Discussed smoking cessation, handouts given and fake cigarette to help with quitting. Encouraged pt to call [phone] if needed. Reviewed NTG use and MI restrictions. Will follow up tomorrow. Thang Flett DunlapRN

## 2012-10-18 LAB — CK TOTAL AND CKMB (NOT AT ARMC): Total CK: 366 U/L — ABNORMAL HIGH (ref 7–177)

## 2012-10-18 LAB — PREPARE RBC (CROSSMATCH)

## 2012-10-18 LAB — CBC
MCH: 31.3 pg (ref 26.0–34.0)
MCH: 31.6 pg (ref 26.0–34.0)
MCHC: 35.5 g/dL (ref 30.0–36.0)
MCV: 94 fL (ref 78.0–100.0)
Platelets: 103 10*3/uL — ABNORMAL LOW (ref 150–400)
Platelets: 168 10*3/uL (ref 150–400)
RBC: 1.66 MIL/uL — ABNORMAL LOW (ref 3.87–5.11)
RBC: 3.48 MIL/uL — ABNORMAL LOW (ref 3.87–5.11)
RDW: 14.3 % (ref 11.5–15.5)
RDW: 14.8 % (ref 11.5–15.5)

## 2012-10-18 LAB — BASIC METABOLIC PANEL
CO2: 22 mEq/L (ref 19–32)
Calcium: 8 mg/dL — ABNORMAL LOW (ref 8.4–10.5)
Creatinine, Ser: 0.58 mg/dL (ref 0.50–1.10)

## 2012-10-18 NOTE — Progress Notes (Signed)
Came to discuss smoking cessation and diet with pt however she declines to talk today. Sts she is frustrated with "everyone telling her to quit smoking". Supported pt and notified her that when we discuss her addiction we will be supportive and give resources. Will f/u in am.  Ethelda Chick CES, ACSM

## 2012-10-18 NOTE — Progress Notes (Signed)
Subjective:  Patient denies any chest pain or shortness of breath. Denies any groin pain. Had significant drop in hemoglobin earlier this a.m. about to receive packed RBC transfusion. Denies any dizziness lightheadedness or weakness  Objective:  Vital Signs in the last 24 hours: Temp:  [97.5 F (36.4 C)-100 F (37.8 C)] 98 F (36.7 C) (12/11 0830) Pulse Rate:  [82-122] 97  (12/11 0830) Resp:  [10-23] 14  (12/11 0404) BP: (119-151)/(50-86) 151/78 mmHg (12/11 0830) SpO2:  [95 %-100 %] 100 % (12/11 0819)  Intake/Output from previous day: 12/10 0701 - 12/11 0700 In: 960 [P.O.:600; I.V.:360] Out: 3275 [Urine:3275] Intake/Output from this shift:    Physical Exam: Neck: no adenopathy, no carotid bruit, no JVD and supple, symmetrical, trachea midline Lungs: clear to auscultation bilaterally Heart: regular rate and rhythm, S1, S2 normal and Soft systolic murmur noted no S3 gallop Abdomen: soft, non-tender; bowel sounds normal; no masses,  no organomegaly Extremities: extremities normal, atraumatic, no cyanosis or edema and Right groin moderate size  ecchymosis noted no bruit or hematoma Pulses: 2+ and symmetric  Lab Results:  Basename 10/18/12 0500 10/17/12 1720 10/17/12 1230  WBC 5.7 -- 10.6*  HGB 5.2* 7.4* --  PLT 103* -- 162    Basename 10/18/12 0500 10/17/12 1230  NA 140 137  K 3.7 3.2*  CL 108 105  CO2 22 23  GLUCOSE 91 103*  BUN 6 8  CREATININE 0.58 0.59    Basename 10/18/12 0500 10/17/12 0026  TROPONINI 11.56* >20.00*   Hepatic Function Panel No results found for this basename: PROT,ALBUMIN,AST,ALT,ALKPHOS,BILITOT,BILIDIR,IBILI in the last 72 hours  Basename 10/17/12 0520  CHOL 146   No results found for this basename: PROTIME in the last 72 hours  Imaging: Imaging results have been reviewed and Ct Abdomen Pelvis Wo Contrast  10/17/2012  *RADIOLOGY REPORT*  Clinical Data: History of recent catheterization.  Recent drop in hemoglobin.  Evaluate for  retroperitoneal hemorrhage.  CT ABDOMEN AND PELVIS WITHOUT CONTRAST  Technique:  Multidetector CT imaging of the abdomen and pelvis was performed following the standard protocol without intravenous contrast.  Comparison: No priors.  Findings:  Lung Bases: Unremarkable.  Abdomen/Pelvis:  There is high attenuation material layering dependently within the gallbladder lumen, compatible with vicarious excretion of contrast material from recent catheterization procedure.  The unenhanced appearance of the liver, pancreas, spleen, bilateral adrenal glands and the left kidney is unremarkable.  A 2 mm nonobstructive calculus is noted in the interpolar collecting system of the right kidney.  In the right side of the retroperitoneum there is a moderate volume of high attenuation fluid, compatible with a retroperitoneal hemorrhage.  This tracks inferiorly immediately anterior to the right iliopsoas musculature into the pelvis where there is a large amount of high attenuation fluid along the right pelvic sidewall, compatible with additional extraperitoneal hemorrhage.  This is exerting significant mass effect upon the adjacent pelvic structures with deviation of the urinary bladder and uterus to the left side of the pelvis.  No definite intraperitoneal extension of hemorrhage.  No ascites. No pneumoperitoneum.  No pathologic distension of small bowel.  In the posterior aspect of the uterine body there is a large 7.3 x 7.9 x 7.4 cm soft tissue attenuation lesion, likely to represent a large fibroid.  Musculoskeletal: Soft tissue stranding in the right groin presumably related to recent catheterization procedure.  No significant fluid collection is noted in this region to suggest a large right groin hematoma.  Small amount of gas is present  in the subcutaneous soft tissues in the right groin. There are no aggressive appearing lytic or blastic lesions noted in the visualized portions of the skeleton.  IMPRESSION: 1.  Study is  positive for retroperitoneal hemorrhage, in addition to extraperitoneal hemorrhage in the right side of the pelvis along the right pelvic sidewall, as discussed above. 2.  2 mm nonobstructive calculus in the interpolar collecting system of the right kidney is incidentally noted.  3.  Large lesion in the posterior aspect of the uterine body likely represents a large fibroid.  These results were called by telephone on 10/17/2012 at 04:02 p.m. to Dr. Sharyn Lull, who verbally acknowledged these results.   Original Report Authenticated By: Trudie Reed, M.D.    Dg Chest Portable 1 View  10/16/2012  *RADIOLOGY REPORT*  Clinical Data: Chest pain.  Vomiting and shortness of breath.  PORTABLE CHEST - 1 VIEW  Comparison: None.  Findings: Artifact overlies chest.  Port is in place from a left subclavian approach with its tip in the SVC just above the right atrium.  The lungs are clear.  The vascularity is normal.  No effusions.  No bony abnormality.  IMPRESSION: No active disease.  Vascular port grossly well positioned.   Original Report Authenticated By: Paulina Fusi, M.D.     Cardiac Studies:  Assessment/Plan:  Acute anemia secondary to retroperitoneal bleed Status post acute anterolateral wall myocardial infarction status post PCI to mid LAD with excellent results  Hypertension  New-onset diabetes mellitus controlled by diet  Tobacco abuse  History of CA of breast Plan Transfuse as per orders keep hematocrit above 25  check CBC post transfusion Check H&H every 12 hours  LOS: 2 days    Akon Reinoso N 10/18/2012, 8:40 AM

## 2012-10-18 NOTE — Care Management Note (Addendum)
    Page 1 of 1   10/19/2012     10:24:17 AM   CARE MANAGEMENT NOTE 10/19/2012  Patient:  Imhoff,Arrie   Account Number:  [id number]  Date Initiated:  10/16/2012  Documentation initiated by:  Junius Creamer  Subjective/Objective Assessment:   adm w mi     Action/Plan:   lives w spouse   Anticipated DC Date:  10/19/2012   Anticipated DC Plan:  HOME/SELF CARE      DC Planning Services  CM consult  Medication Assistance  MATCH Program      Choice offered to / List presented to:             Status of service:   Medicare Important Message given?   (If response is "NO", the following Medicare IM given date fields will be blank) Date Medicare IM given:   Date Additional Medicare IM given:    Discharge Disposition:  HOME/SELF CARE  Per UR Regulation:  Reviewed for med. necessity/level of care/duration of stay  If discussed at Long Length of Stay Meetings, dates discussed:    Comments:  12/12 10:22a debbie Anabia Weatherwax rn,bsn gave pt signed pt assist form for brilinta. filled out match program for 34days of meds.pt has brilintal 30day free card and copay assist card.  12/11 11:08a debbie Milo Schreier rn,bsn gave pt prescription copay assist card that may help w meds that are brand name. [employer] to see pt.  12/9 12:21p debbie Keyshia Orwick rn,bsn [phone] will give pt brilinta 30day free card and copay assist card.no ins listed, brilinta pt assist form left on chart.

## 2012-10-18 NOTE — Progress Notes (Signed)
Dr Sharyn Lull updated on pt's status and 1431 CBC results (please see below). Md advised this RN not to draw 1700 H/H. Pt resting comfortably in bed. Denies complaints. Monitoring closely.    Results for [name], [name] (MRN [id number]) as of 10/18/2012 15:38  Ref. Range 10/18/2012 14:31  WBC Latest Range: 4.0-10.5 K/uL 8.7  RBC Latest Range: 3.87-5.11 MIL/uL 3.48 (L)  Hemoglobin Latest Range: 12.0-15.0 g/dL 40.9 (L)  HCT Latest Range: 36.0-46.0 % 31.0 (L)  MCV Latest Range: 78.0-100.0 fL 89.1  MCH Latest Range: 26.0-34.0 pg 31.6  MCHC Latest Range: 30.0-36.0 g/dL 81.1  RDW Latest Range: 11.5-15.5 % 14.8  Platelets Latest Range: 150-400 K/uL 168

## 2012-10-18 NOTE — Progress Notes (Signed)
CRITICAL VALUE ALERT  Critical value received:  Hgb  Date of notification:  10/18/2012  Time of notification:  0557  Critical value read back:yes  Nurse who received alert:  Sherry Ruffing, RN  MD notified (1st page):  Harwani  Time of first page:  0558  MD notified (2nd page):  Time of second page: 0606  Responding MD:  Sharyn Lull   Time MD responded:  [phone]

## 2012-10-19 LAB — TYPE AND SCREEN
ABO/RH(D): A POS
Unit division: 0

## 2012-10-19 LAB — HEMOGLOBIN AND HEMATOCRIT, BLOOD: Hemoglobin: 10.9 g/dL — ABNORMAL LOW (ref 12.0–15.0)

## 2012-10-19 MED ORDER — TICAGRELOR 90 MG PO TABS: 90.0000 mg | ORAL_TABLET | Freq: Two times a day (BID) | ORAL | Status: DC

## 2012-10-19 MED ORDER — HEPARIN SOD (PORK) LOCK FLUSH 100 UNIT/ML IV SOLN
500.0000 [IU] | INTRAVENOUS | Status: AC | PRN
  Administered 2012-10-19: 500 [IU]

## 2012-10-19 MED ORDER — SODIUM CHLORIDE 0.9 % IJ SOLN
10.0000 mL | INTRAMUSCULAR | Status: DC | PRN
  Administered 2012-10-19: 10 mL

## 2012-10-19 MED ORDER — RAMIPRIL 2.5 MG PO CAPS: 2.5000 mg | ORAL_CAPSULE | Freq: Every day | ORAL | Status: DC

## 2012-10-19 MED ORDER — ATORVASTATIN CALCIUM 80 MG PO TABS: 80.0000 mg | ORAL_TABLET | Freq: Every day | ORAL | Status: DC

## 2012-10-19 MED ORDER — NITROGLYCERIN 0.4 MG SL SUBL: 0.4000 mg | SUBLINGUAL_TABLET | SUBLINGUAL | Status: DC | PRN

## 2012-10-19 MED ORDER — METOPROLOL TARTRATE 12.5 MG HALF TABLET: 12.5000 mg | ORAL_TABLET | Freq: Two times a day (BID) | ORAL | Status: DC

## 2012-10-19 MED ORDER — ALPRAZOLAM 0.25 MG PO TABS: 0.2500 mg | ORAL_TABLET | Freq: Two times a day (BID) | ORAL | Status: DC | PRN

## 2012-10-19 MED ORDER — SODIUM CHLORIDE 0.9 % IJ SOLN: 10.0000 mL | Freq: Two times a day (BID) | INTRAMUSCULAR | Status: DC

## 2012-10-19 MED ORDER — FERROUS SULFATE 325 (65 FE) MG PO TABS: 325.0000 mg | ORAL_TABLET | Freq: Every day | ORAL | Status: DC

## 2012-10-19 MED ORDER — ASPIRIN 81 MG PO TBEC: 81.0000 mg | DELAYED_RELEASE_TABLET | Freq: Every day | ORAL | Status: DC

## 2012-10-19 NOTE — Discharge Summary (Signed)
[name], [name]                 ACCOUNT NO.:  [id number]  MEDICAL RECORD NO.:  [id number]  LOCATION:  2904                         FACILITY:  [facility]  PHYSICIAN:  [name], M.D. DATE OF BIRTH:  03-02-1971  DATE OF ADMISSION:  10/16/2012 DATE OF DISCHARGE:  10/19/2012                              DISCHARGE SUMMARY   ADMITTING DIAGNOSES: 1. Acute anterolateral wall myocardial infarction. 2. New onset hypertension. 3. Glucose intolerance, rule out diabetes mellitus. 4. Tobacco abuse. 5. History of cancer of breast.  FINAL DIAGNOSES: 1. Status post acute anterolateral wall myocardial infarction, status     post primary percutaneous coronary intervention to the mid-left     anterior descending artery with excellent results. 2. Hypertension. 3. Glucose intolerance. 4. Acute anemia secondary to retroperitoneal bleed/hydration/blood     loss during the procedure, stable. 5. Tobacco abuse. 6. History of cancer of breast. 7. Hypercholesteremia.  DISCHARGE HOME MEDICATIONS: 1. Xanax 0.25 mg 1 tablet twice daily. 2. Enteric-coated aspirin 81 mg 1 tablet daily. 3. Atorvastatin 80 mg 1 tablet daily. 4. Feosol 325 mg 1 tablet daily. 5. Lopressor 12.5 mg twice daily. 6. Nitrostat 0.4 mg sublingual use as directed. 7. Ramipril 2.5 mg 1 capsule daily. 8. Brilinta 90 mg 1 tablet twice daily.  DIET:  Low salt, low cholesterol, 1800 calories ADA diet.  The patient has been advised to refrain from smoking to which she agrees.  Post-cardiac cath/percutaneous transluminal coronary angioplasty and stenting instructions have been given.  Follow up with me in 1 week.  The patient will be scheduled for phase 2 cardiac rehab as outpatient.  CONDITION AT DISCHARGE:  Stable.  BRIEF HISTORY AND HOSPITAL COURSE:  [name] is 41 year old female with past medical history significant for cancer of breast, tobacco abuse, was transferred from [facility], last code STEMI was  called. The patient complaints of recurrent retrosternal chest pain described as pressure, grade 6-10/10, off and on since yesterday, radiating to left arm, associated with nausea, vomiting, and mild shortness of breath. The patient initially did not seek any medical attention, but as chest pain got worse today, she drove to the ER.  The patient denies such episodes in the past.  Denies any palpitation, lightheadedness, or syncope.  EKG done in the ER showed sinus tachycardia with ST elevation in anterolateral leads with Q-waves suggestive of inferolateral wall myocardial infarction.  The patient presently complains of chest pain, grade 6/10.  The patient received aspirin, IV heparin, warfarin, 300 mg of Plavix, and Lopressor in ED, and was transferred to [facility] Cath Lab for emergency PCI.  PAST MEDICAL HISTORY:  As above.  PAST SURGICAL HISTORY:  She had breast lumpectomy in the past.  Had Port- A-Cath placement in the past.  Had C-section in the past.  PHYSICAL EXAMINATION:  VITAL SIGNS:  Her blood pressure seen in the Cath Lab, initial blood pressure was 180/125, pulse was 95.  She was afebrile. HEENT:  Conjunctivae was pink. NECK:  Supple.  No JVD.  No bruit. LUNGS:  Clear to auscultation without rhonchi or rales. CARDIOVASCULAR:  S1, S2 was normal.  There was soft systolic murmur and S4 gallop.  ABDOMEN:  Soft.  Bowel sounds were present.  Nontender. EXTREMITIES:  There was no clubbing, cyanosis, or edema.  LABORATORY STUDIES:  Sodium was 137, potassium 4.4, BUN 15, creatinine 0.73, glucose was 231.  Repeat blood sugar was 100, repeat fasting blood sugar was 91.  Her troponin-I were above 20 x2.  Repeat troponin-I yesterday was 11.56, which is trending down.  CK is 366, MB 5.6. Admission hemoglobin was 16.6, hematocrit 47.5.  Repeat hemoglobin postprocedure was 13.3, hematocrit 37.8.  Repeat hemoglobin was 8.3, hematocrit 24.1, repeat hemoglobin 7.7, 22.2.  Repeat  hemoglobin was 7.4, hematocrit 21.3.  Next hemoglobin was 5.2, hematocrit 15.6, requiring 2 units of packed RBCs.  Posttransfusion, hemoglobin was 11, hematocrit 31.0.  Repeat hemoglobin today this morning is 10.9, hematocrit 30.8, which is stable.  Last electrolytes were sodium 140, potassium 3.7, BUN 6, creatinine 0.58, and glucose was 91.  EKG yesterday showed normal sinus rhythm with poor R-wave progression in V1 to V3.  CT of the abdomen and pelvis showed retroperitoneal hemorrhage in addition to extraperitoneal hemorrhage in the right side of the pelvis.  BRIEF HOSPITAL COURSE:  The patient was transferred from [facility] ER to [facility] and subsequently underwent emergency PTCA, stenting to mid-LAD as per procedure report.  The patient tolerated the procedure well.  Post-procedure, the patient did not have any episodes of chest pain, but developed hypotension and tachycardia and was noted to have retroperitoneal bleed.  The patient was treated initially conservatively and stabilized, subsequently had CT of the abdomen and pelvis, which showed retroperitoneal bleed.  The patient received total of 2 units of packed RBCs during the hospital stay as she dropped hemoglobin to 5.2, which appears to be spurious.  Her hemoglobin went up to 11.0 after 2 units of packed RBCs.  Phase 1 cardiac rehab was called.  The patient has been ambulating in hallway without any problems.  Her groin is stable with no evidence of hematoma or bruit, with moderate area of ecchymoses, which has been stable.  The patient was discussed at length regarding lifestyle modification, diet and smoking cessation to which she agrees.  The patient will be discharged home on above medications and will be followed up in my office in 1 week and will be scheduled for phase 2 cardiac rehab as outpatient.     [name], M.D.     [facility]/MEDQ  D:  10/19/2012  T:  10/19/2012  Job:  [id number]

## 2012-10-19 NOTE — Discharge Summary (Signed)
  Discharge summary dictated on 10/19/2012 dictation number is [phone]

## 2012-10-19 NOTE — Progress Notes (Signed)
CARDIAC REHAB PHASE I   PRE:  Rate/Rhythm: 88 SR  BP:  Supine:   Sitting: 114/77  Standing:    SaO2: 100 RA  MODE:  Ambulation: 700 ft   POST:  Rate/Rhythem: 118 ST  BP:  Supine:   Sitting: 130/80  Standing:    SaO2: 98 RA 0830-0940  Pt tolerated ambulation well without c/o of cp or SOB. Pt states that groin is tender with walking but not bad. VS stable. Completed MI education with pt. She voices understanding. Pt agrees to Outpt. CRP in [place], will send referral. Strongly encouraged smoking cessation. She does voice interest in cessation, but a lot of her family members smoke so she will be challenged.  Beatrix Fetters

## 2012-10-19 NOTE — Progress Notes (Signed)
Discharge instructions given , discharge paperwork given to pt. Pt stated understanding of instrs. Saline lock DC'd. Port-a-cath deaccessed per IV team RN. Pt discharged per wheelchair with all belongings.

## 2012-12-19 ENCOUNTER — Ambulatory Visit: Payer: Self-pay

## 2012-12-27 ENCOUNTER — Ambulatory Visit: Payer: Self-pay | Admitting: Oncology

## 2013-01-06 ENCOUNTER — Ambulatory Visit: Payer: Self-pay | Admitting: Oncology

## 2013-04-08 ENCOUNTER — Ambulatory Visit: Payer: Self-pay | Admitting: Oncology

## 2013-05-09 ENCOUNTER — Encounter: Payer: Self-pay | Admitting: *Deleted

## 2013-05-09 DIAGNOSIS — C50412 Malignant neoplasm of upper-outer quadrant of left female breast: Secondary | ICD-10-CM | POA: Insufficient documentation

## 2013-12-31 ENCOUNTER — Ambulatory Visit: Payer: Self-pay | Admitting: General Surgery

## 2014-01-15 ENCOUNTER — Encounter: Payer: Self-pay | Admitting: *Deleted

## 2014-02-22 ENCOUNTER — Encounter (HOSPITAL_COMMUNITY): Payer: Self-pay | Admitting: Emergency Medicine

## 2014-02-22 ENCOUNTER — Emergency Department (HOSPITAL_COMMUNITY)
Admission: EM | Admit: 2014-02-22 | Discharge: 2014-02-22 | Disposition: A | Discharge status: Home / Self Care | Payer: Medicaid Other | Attending: Emergency Medicine | Admitting: Emergency Medicine

## 2014-02-22 ENCOUNTER — Emergency Department (HOSPITAL_COMMUNITY): Payer: Medicaid Other

## 2014-02-22 DIAGNOSIS — J45909 Unspecified asthma, uncomplicated: Secondary | ICD-10-CM | POA: Insufficient documentation

## 2014-02-22 DIAGNOSIS — Y93F2 Activity, caregiving, lifting: Secondary | ICD-10-CM | POA: Insufficient documentation

## 2014-02-22 DIAGNOSIS — I10 Essential (primary) hypertension: Secondary | ICD-10-CM | POA: Insufficient documentation

## 2014-02-22 DIAGNOSIS — X500XXA Overexertion from strenuous movement or load, initial encounter: Secondary | ICD-10-CM | POA: Insufficient documentation

## 2014-02-22 DIAGNOSIS — F17200 Nicotine dependence, unspecified, uncomplicated: Secondary | ICD-10-CM | POA: Insufficient documentation

## 2014-02-22 DIAGNOSIS — Z853 Personal history of malignant neoplasm of breast: Secondary | ICD-10-CM | POA: Insufficient documentation

## 2014-02-22 DIAGNOSIS — Z7982 Long term (current) use of aspirin: Secondary | ICD-10-CM | POA: Insufficient documentation

## 2014-02-22 DIAGNOSIS — Z79899 Other long term (current) drug therapy: Secondary | ICD-10-CM | POA: Insufficient documentation

## 2014-02-22 DIAGNOSIS — IMO0002 Reserved for concepts with insufficient information to code with codable children: Secondary | ICD-10-CM | POA: Insufficient documentation

## 2014-02-22 DIAGNOSIS — Y929 Unspecified place or not applicable: Secondary | ICD-10-CM | POA: Insufficient documentation

## 2014-02-22 DIAGNOSIS — Z880 Allergy status to penicillin: Secondary | ICD-10-CM | POA: Insufficient documentation

## 2014-02-22 DIAGNOSIS — M62838 Other muscle spasm: Secondary | ICD-10-CM | POA: Insufficient documentation

## 2014-02-22 DIAGNOSIS — S56911A Strain of unspecified muscles, fascia and tendons at forearm level, right arm, initial encounter: Secondary | ICD-10-CM

## 2014-02-22 DIAGNOSIS — I252 Old myocardial infarction: Secondary | ICD-10-CM | POA: Insufficient documentation

## 2014-02-22 DIAGNOSIS — Z9861 Coronary angioplasty status: Secondary | ICD-10-CM | POA: Insufficient documentation

## 2014-02-22 MED ORDER — CYCLOBENZAPRINE HCL 5 MG PO TABS: 5.0000 mg | ORAL_TABLET | Freq: Three times a day (TID) | ORAL | Status: DC | PRN

## 2014-02-22 MED ORDER — PREDNISONE 10 MG PO TABS: ORAL_TABLET | ORAL | Status: DC

## 2014-02-22 MED ORDER — IBUPROFEN 800 MG PO TABS
800.0000 mg | ORAL_TABLET | Freq: Once | ORAL | Status: AC
  Administered 2014-02-22: 800 mg via ORAL
  Filled 2014-02-22: qty 1

## 2014-02-22 NOTE — Discharge Instructions (Signed)
Muscle Strain A muscle strain is an injury that occurs when a muscle is stretched beyond its normal length. Usually a small number of muscle fibers are torn when this happens. Muscle strain is rated in degrees. First-degree strains have the least amount of muscle fiber tearing and pain. Second-degree and third-degree strains have increasingly more tearing and pain.  Usually, recovery from muscle strain takes 1 2 weeks. Complete healing takes 5 6 weeks.  CAUSES  Muscle strain happens when a sudden, violent force placed on a muscle stretches it too far. This may occur with lifting, sports, or a fall.  RISK FACTORS Muscle strain is especially common in athletes.  SIGNS AND SYMPTOMS At the site of the muscle strain, there may be:  Pain.  Bruising.  Swelling.  Difficulty using the muscle due to pain or lack of normal function. DIAGNOSIS  Your health care provider will perform a physical exam and ask about your medical history. TREATMENT  Often, the best treatment for a muscle strain is resting, icing, and applying cold compresses to the injured area.  HOME CARE INSTRUCTIONS   Use the PRICE method of treatment to promote muscle healing during the first 2 3 days after your injury. The PRICE method involves:  Protecting the muscle from being injured again.  Restricting your activity and resting the injured body part.  Icing your injury. To do this, put ice in a plastic bag. Place a towel between your skin and the bag. Then, apply the ice and leave it on from 15 20 minutes each hour. After the third day, switch to moist heat packs.  Apply compression to the injured area with a splint or elastic bandage. Be careful not to wrap it too tightly. This may interfere with blood circulation or increase swelling.  Elevate the injured body part above the level of your heart as often as you can.  Only take over-the-counter or prescription medicines for pain, discomfort, or fever as directed by your  health care provider.  Warming up prior to exercise helps to prevent future muscle strains. SEEK MEDICAL CARE IF:   You have increasing pain or swelling in the injured area.  You have numbness, tingling, or a significant loss of strength in the injured area. MAKE SURE YOU:   Understand these instructions.  Will watch your condition.  Will get help right away if you are not doing well or get worse. Document Released: 10/25/2005 Document Revised: 08/15/2013 Document Reviewed: 05/24/2013 [facility] Patient Information 2014 [place], [place].   Use the medicines prescribed along with applying a heating pad to your forearm but also your neck and shoulders as discussed,  20 minutes 3 times daily with the heating pad.  Followup with your doctor in 1 week if you are not improving.

## 2014-02-22 NOTE — ED Provider Notes (Signed)
CSN: [id number]     Arrival date & time 02/22/14  1649 History   First MD Initiated Contact with Patient 02/22/14 1704     Chief Complaint  Patient presents with  . Arm Pain     (Consider location/radiation/quality/duration/timing/severity/associated sxs/prior Treatment) HPI Comments: Latrece Nitta is a 43 y.o. Female presenting with pain in her right forearm, right shoulder and upper back since moving furniture 2 days ago.  She reports feeling a sudden "popping" sensation in her dorsal mid right forearm while lifting one end of a sofa at which time her pain began.  It is worsened with movement and palpation and she feels intermittent cold sensation in the arm radiating into her hand associated with a "spider web" appearing discoloration to her skin which is also intermittent when the cold sensation is present.  She denies swelling, bruising , weakness or numbness in the hand or arm.  She denies pallor or duskiness in her fingertips during these episodes.  She has pain along her bilateral upper back and shoulder blade area since the event.  She has applied a heating pad without relief.  She has not taken any medicines for this injury.     The history is provided by the patient.    Past Medical History  Diagnosis Date  . Asthma 2005  . Hypertension 2012  . Personal history of tobacco use, presenting hazards to health   . Myocardial infarction 10/2012  . Cancer 2012    breast cancer, right-treated with neoadjunvant chemotherapy followed by partial mastectomy with sn bx and radiation. Pt is currently taking Tamoxifen since January 2013   . Lump or mass in breast 2012    right  . Malignant neoplasm of upper-outer quadrant of female breast 2012    right   Past Surgical History  Procedure Laterality Date  . Portacath placement    . Cesarean section  [id number]  . Tubal ligation  2004  . Breast lumpectomy Right 2012  . Finger surgery Left 1986    reconstruction, left pinky  . Wrist  surgery Left 1987  . Coronary angioplasty with stent placement  20123    Myocardial infarction   Family History  Problem Relation Age of Onset  . Cancer Maternal Grandmother 79    breast   History  Substance Use Topics  . Smoking status: Current Every Day Smoker -- 1.00 packs/day for 15 years    Types: Cigarettes  . Smokeless tobacco: Not on file  . Alcohol Use: No   OB History   Grav Para Term Preterm Abortions TAB SAB Ect Mult Living   2         2     Obstetric Comments   Age with first menstruation-11 Age with first pregnancy-18 LMP-2012     Review of Systems  Constitutional: Negative for fever.  Musculoskeletal: Positive for arthralgias. Negative for joint swelling and myalgias.  Skin: Positive for color change. Negative for pallor and wound.  Neurological: Negative for weakness, numbness and headaches.      Allergies  Codeine and Penicillins  Home Medications   Prior to Admission medications   Medication Sig Start Date End Date Taking? Authorizing Provider  ALPRAZolam (XANAX) 0.25 MG tablet Take 1 tablet (0.25 mg total) by mouth 2 (two) times daily as needed for anxiety. 10/19/12   Clent Demark, MD  aspirin EC 81 MG EC tablet Take 1 tablet (81 mg total) by mouth daily. 10/19/12   Clent Demark, MD  atorvastatin (  LIPITOR) 80 MG tablet Take 1 tablet (80 mg total) by mouth daily at 6 PM. 10/19/12   Clent Demark, MD  ferrous sulfate 325 (65 FE) MG tablet Take 1 tablet (325 mg total) by mouth daily with breakfast. 10/19/12   Clent Demark, MD  metoprolol tartrate (LOPRESSOR) 12.5 mg TABS Take 0.5 tablets (12.5 mg total) by mouth 2 (two) times daily. 10/19/12   Clent Demark, MD  nitroGLYCERIN (NITROSTAT) 0.4 MG SL tablet Place 1 tablet (0.4 mg total) under the tongue every 5 (five) minutes x 3 doses as needed for chest pain. 10/19/12   Clent Demark, MD  ramipril (ALTACE) 2.5 MG capsule Take 1 capsule (2.5 mg total) by mouth daily. 10/19/12   Clent Demark, MD  Ticagrelor (BRILINTA) 90 MG TABS tablet Take 1 tablet (90 mg total) by mouth 2 (two) times daily. 10/19/12   Clent Demark, MD   BP 116/92  Pulse 93  Temp(Src) 97.9 F (36.6 C) (Oral)  Resp 16  Ht 5\' 2"  (1.575 m)  Wt 125 lb (56.7 kg)  BMI 22.86 kg/m2  SpO2 98% Physical Exam  Constitutional: She appears well-developed and well-nourished.  HENT:  Head: Atraumatic.  Neck: Normal range of motion.  Cardiovascular:  Radial pulses equal bilaterally  Musculoskeletal: She exhibits tenderness. She exhibits no edema.  ttp right mid dorsal forearm without edema, ecchymosis,  No visible trauma.  Radial pulses equal and normal.  No crepitus, no elbow wrist, shoulder pain.  She is tender bilateral paracervical with no midline c spine tenderness.    Neurological: She is alert. She has normal strength. She displays normal reflexes. No sensory deficit.  Skin: Skin is warm and dry. No rash noted. No erythema. No pallor.  No ecchymosis.  Skin appears normal coloration.  Less than 2 sec cap refill in fingertips.  Psychiatric: She has a normal mood and affect.    ED Course  Procedures (including critical care time) Labs Review Labs Reviewed - No data to display  Imaging Review Dg Shoulder Right  02/22/2014   CLINICAL DATA:  Shoulder pain 2 days after moving furniture.  EXAM: RIGHT SHOULDER - 2+ VIEW  COMPARISON:  DG FOREARM*R* dated 02/22/2014; DG CHEST 1V PORT dated 10/16/2012  FINDINGS: There is no evidence of fracture or dislocation. There is no evidence of arthropathy or other focal bone abnormality. Soft tissues are unremarkable. Port-A-Cath noted.  IMPRESSION: Negative.   Electronically Signed   By: Sherryl Barters M.D.   On: 02/22/2014 17:49   Dg Forearm Right  02/22/2014   CLINICAL DATA:  Wrist pain and arm 2 days after moving furniture.  EXAM: RIGHT FOREARM - 2 VIEW  COMPARISON:  None.  FINDINGS: No fracture or acute bony findings. Slightly irregular spurring along the base of  the fifth metacarpal. No definite elbow joint effusion. No acute fracture of the forearm identified.  IMPRESSION: 1. Minimal spurring along the base of the fifth metacarpal. No acute findings.   Electronically Signed   By: Sherryl Barters M.D.   On: 02/22/2014 17:47     EKG Interpretation None      MDM   Final diagnoses:  Strain of forearm, right  Muscle spasms of neck    Patients labs and/or radiological studies were viewed and considered during the medical decision making and disposition process.  At re-exam,  Upper extremities still equal in coloration and warmth.  Right arm was elevated for 3 minutes with no loss of sensation,  no mottling, no decreased cap refill.  She reports had several lymph nodes excised during her tx for breast ca.  She denies h/o lymphedema in this extremity. She was encouraged rest,  Heating pad,  Prednisone, flexeril prescribed.  F/u with pcp in 1 week if not improved.  Pt has chronic problems with her neck - pain, muscle spasm.  May benefit from mri and back and spine specialist if abnormalities found.  Suggested she discuss with pcp for further eval for persistent sx.  Pt understands plan.     Evalee Jefferson, PA-C 02/22/14 1835

## 2014-02-22 NOTE — ED Notes (Signed)
Pain rt arm up into shoulder and neck. Alert, NAD

## 2014-02-22 NOTE — ED Notes (Signed)
Moving furniture and heard a "pop" x 2 days ago.  Reports pain and coolness to right arm since.

## 2014-02-22 NOTE — ED Provider Notes (Signed)
Medical screening examination/treatment/procedure(s) were performed by non-physician practitioner and as supervising physician I was immediately available for consultation/collaboration.   EKG Interpretation None        Cianni Manny L Menashe Kafer, MD 02/22/14 2315 

## 2014-09-09 ENCOUNTER — Encounter (HOSPITAL_COMMUNITY): Payer: Self-pay | Admitting: Emergency Medicine

## 2014-10-17 ENCOUNTER — Encounter (HOSPITAL_COMMUNITY): Payer: Self-pay | Admitting: Cardiology

## 2015-01-23 ENCOUNTER — Encounter (HOSPITAL_COMMUNITY): Payer: Self-pay | Admitting: Emergency Medicine

## 2015-01-23 ENCOUNTER — Emergency Department (HOSPITAL_COMMUNITY): Payer: Self-pay

## 2015-01-23 ENCOUNTER — Inpatient Hospital Stay (HOSPITAL_COMMUNITY): Payer: Medicaid Other

## 2015-01-23 ENCOUNTER — Inpatient Hospital Stay (HOSPITAL_COMMUNITY)
Admission: EM | Admit: 2015-01-23 | Discharge: 2015-01-27 | DRG: 193 | Disposition: A | Discharge status: Home / Self Care | Payer: Self-pay | Attending: Family Medicine | Admitting: Family Medicine

## 2015-01-23 DIAGNOSIS — C50412 Malignant neoplasm of upper-outer quadrant of left female breast: Secondary | ICD-10-CM | POA: Diagnosis present

## 2015-01-23 DIAGNOSIS — J45909 Unspecified asthma, uncomplicated: Secondary | ICD-10-CM | POA: Diagnosis present

## 2015-01-23 DIAGNOSIS — Z87891 Personal history of nicotine dependence: Secondary | ICD-10-CM

## 2015-01-23 DIAGNOSIS — R9389 Abnormal findings on diagnostic imaging of other specified body structures: Secondary | ICD-10-CM

## 2015-01-23 DIAGNOSIS — Z803 Family history of malignant neoplasm of breast: Secondary | ICD-10-CM

## 2015-01-23 DIAGNOSIS — C50411 Malignant neoplasm of upper-outer quadrant of right female breast: Secondary | ICD-10-CM | POA: Diagnosis present

## 2015-01-23 DIAGNOSIS — J9601 Acute respiratory failure with hypoxia: Secondary | ICD-10-CM | POA: Diagnosis present

## 2015-01-23 DIAGNOSIS — J189 Pneumonia, unspecified organism: Principal | ICD-10-CM

## 2015-01-23 DIAGNOSIS — Z7982 Long term (current) use of aspirin: Secondary | ICD-10-CM

## 2015-01-23 DIAGNOSIS — I219 Acute myocardial infarction, unspecified: Secondary | ICD-10-CM | POA: Diagnosis present

## 2015-01-23 DIAGNOSIS — I2510 Atherosclerotic heart disease of native coronary artery without angina pectoris: Secondary | ICD-10-CM | POA: Diagnosis present

## 2015-01-23 DIAGNOSIS — Z923 Personal history of irradiation: Secondary | ICD-10-CM

## 2015-01-23 DIAGNOSIS — I252 Old myocardial infarction: Secondary | ICD-10-CM

## 2015-01-23 DIAGNOSIS — Z9221 Personal history of antineoplastic chemotherapy: Secondary | ICD-10-CM

## 2015-01-23 DIAGNOSIS — I10 Essential (primary) hypertension: Secondary | ICD-10-CM | POA: Diagnosis present

## 2015-01-23 DIAGNOSIS — C50419 Malignant neoplasm of upper-outer quadrant of unspecified female breast: Secondary | ICD-10-CM

## 2015-01-23 LAB — CBC WITH DIFFERENTIAL/PLATELET
BASOS ABS: 0 10*3/uL (ref 0.0–0.1)
BASOS PCT: 0 % (ref 0–1)
EOS ABS: 0 10*3/uL (ref 0.0–0.7)
EOS PCT: 0 % (ref 0–5)
HEMATOCRIT: 39.9 % (ref 36.0–46.0)
HEMOGLOBIN: 13.7 g/dL (ref 12.0–15.0)
LYMPHS ABS: 1.1 10*3/uL (ref 0.7–4.0)
LYMPHS PCT: 5 % — AB (ref 12–46)
MCH: 31.6 pg (ref 26.0–34.0)
MCHC: 34.3 g/dL (ref 30.0–36.0)
MCV: 91.9 fL (ref 78.0–100.0)
Monocytes Absolute: 1.1 10*3/uL — ABNORMAL HIGH (ref 0.1–1.0)
Monocytes Relative: 5 % (ref 3–12)
NEUTROS PCT: 90 % — AB (ref 43–77)
Neutro Abs: 20.2 10*3/uL — ABNORMAL HIGH (ref 1.7–7.7)
PLATELETS: 462 10*3/uL — AB (ref 150–400)
RBC: 4.34 MIL/uL (ref 3.87–5.11)
RDW: 13.6 % (ref 11.5–15.5)
WBC: 22.4 10*3/uL — AB (ref 4.0–10.5)

## 2015-01-23 LAB — COMPREHENSIVE METABOLIC PANEL
ALK PHOS: 76 U/L (ref 39–117)
ALT: 10 U/L (ref 0–35)
AST: 14 U/L (ref 0–37)
Albumin: 3.2 g/dL — ABNORMAL LOW (ref 3.5–5.2)
Anion gap: 12 (ref 5–15)
BUN: 7 mg/dL (ref 6–23)
CHLORIDE: 97 mmol/L (ref 96–112)
CO2: 28 mmol/L (ref 19–32)
Calcium: 8.8 mg/dL (ref 8.4–10.5)
Creatinine, Ser: 0.62 mg/dL (ref 0.50–1.10)
GFR calc non Af Amer: >90 mL/min (ref >90)
Glucose, Bld: 123 mg/dL — ABNORMAL HIGH (ref 70–99)
POTASSIUM: 3 mmol/L — AB (ref 3.5–5.1)
Sodium: 137 mmol/L (ref 135–145)
TOTAL PROTEIN: 7.5 g/dL (ref 6.0–8.3)
Total Bilirubin: 0.8 mg/dL (ref 0.3–1.2)

## 2015-01-23 LAB — BRAIN NATRIURETIC PEPTIDE: B Natriuretic Peptide: 72 pg/mL (ref 0.0–100.0)

## 2015-01-23 LAB — TROPONIN I: Troponin I: <0.03 ng/mL (ref <0.031)

## 2015-01-23 LAB — LACTIC ACID, PLASMA: Lactic Acid, Venous: 1.2 mmol/L (ref 0.5–2.0)

## 2015-01-23 MED ORDER — DEXTROSE 5 % IV SOLN: 500.0000 mg | INTRAVENOUS | Status: DC

## 2015-01-23 MED ORDER — ALBUTEROL SULFATE (2.5 MG/3ML) 0.083% IN NEBU: 2.5000 mg | INHALATION_SOLUTION | Freq: Once | RESPIRATORY_TRACT | Status: DC

## 2015-01-23 MED ORDER — RAMIPRIL 1.25 MG PO CAPS
2.5000 mg | ORAL_CAPSULE | Freq: Every day | ORAL | Status: DC
  Administered 2015-01-27: 2.5 mg via ORAL
  Filled 2015-01-23 (×2): qty 2
  Filled 2015-01-23 (×2): qty 1

## 2015-01-23 MED ORDER — IPRATROPIUM-ALBUTEROL 0.5-2.5 (3) MG/3ML IN SOLN
3.0000 mL | Freq: Once | RESPIRATORY_TRACT | Status: AC
  Administered 2015-01-23: 3 mL via RESPIRATORY_TRACT
  Filled 2015-01-23: qty 3

## 2015-01-23 MED ORDER — SODIUM CHLORIDE 0.9 % IV BOLUS (SEPSIS)
500.0000 mL | Freq: Once | INTRAVENOUS | Status: AC
  Administered 2015-01-23: 500 mL via INTRAVENOUS

## 2015-01-23 MED ORDER — DEXTROSE 5 % IV SOLN: 1.0000 g | INTRAVENOUS | Status: DC

## 2015-01-23 MED ORDER — CEFTRIAXONE SODIUM IN DEXTROSE 20 MG/ML IV SOLN
1.0000 g | INTRAVENOUS | Status: DC
  Administered 2015-01-23: 1 g via INTRAVENOUS
  Filled 2015-01-23 (×4): qty 50

## 2015-01-23 MED ORDER — LEVOFLOXACIN IN D5W 750 MG/150ML IV SOLN
750.0000 mg | Freq: Once | INTRAVENOUS | Status: AC
  Administered 2015-01-23: 750 mg via INTRAVENOUS
  Filled 2015-01-23: qty 150

## 2015-01-23 MED ORDER — IOHEXOL 300 MG/ML  SOLN
80.0000 mL | Freq: Once | INTRAMUSCULAR | Status: AC | PRN
  Administered 2015-01-23: 80 mL via INTRAVENOUS

## 2015-01-23 MED ORDER — DEXTROSE 5 % IV SOLN
INTRAVENOUS | Status: AC
  Filled 2015-01-23: qty 500

## 2015-01-23 MED ORDER — ALBUTEROL SULFATE (2.5 MG/3ML) 0.083% IN NEBU
2.5000 mg | INHALATION_SOLUTION | Freq: Once | RESPIRATORY_TRACT | Status: AC
  Administered 2015-01-23: 2.5 mg via RESPIRATORY_TRACT
  Filled 2015-01-23: qty 3

## 2015-01-23 MED ORDER — ASPIRIN EC 81 MG PO TBEC
81.0000 mg | DELAYED_RELEASE_TABLET | Freq: Every day | ORAL | Status: DC
  Administered 2015-01-24 – 2015-01-27 (×4): 81 mg via ORAL
  Filled 2015-01-23 (×4): qty 1

## 2015-01-23 MED ORDER — DEXTROSE 5 % IV SOLN
INTRAVENOUS | Status: AC
  Filled 2015-01-23: qty 10

## 2015-01-23 MED ORDER — TICAGRELOR 90 MG PO TABS
90.0000 mg | ORAL_TABLET | Freq: Two times a day (BID) | ORAL | Status: DC
  Filled 2015-01-23 (×12): qty 1

## 2015-01-23 MED ORDER — IPRATROPIUM-ALBUTEROL 0.5-2.5 (3) MG/3ML IN SOLN
3.0000 mL | Freq: Once | RESPIRATORY_TRACT | Status: DC
Start: 2015-01-23 — End: 2015-01-23

## 2015-01-23 MED ORDER — METOPROLOL TARTRATE 25 MG PO TABS
12.5000 mg | ORAL_TABLET | Freq: Two times a day (BID) | ORAL | Status: DC
  Administered 2015-01-27: 12.5 mg via ORAL
  Filled 2015-01-23 (×2): qty 1

## 2015-01-23 MED ORDER — SODIUM CHLORIDE 0.9 % IV SOLN: INTRAVENOUS | Status: DC

## 2015-01-23 MED ORDER — HEPARIN SODIUM (PORCINE) 5000 UNIT/ML IJ SOLN
5000.0000 [IU] | Freq: Three times a day (TID) | INTRAMUSCULAR | Status: DC
  Administered 2015-01-24 – 2015-01-26 (×3): 5000 [IU] via SUBCUTANEOUS
  Filled 2015-01-23 (×3): qty 1

## 2015-01-23 MED ORDER — ATORVASTATIN CALCIUM 40 MG PO TABS
80.0000 mg | ORAL_TABLET | Freq: Every day | ORAL | Status: DC
  Filled 2015-01-23 (×4): qty 2

## 2015-01-23 MED ORDER — DEXTROSE 5 % IV SOLN
500.0000 mg | INTRAVENOUS | Status: DC
  Administered 2015-01-23 – 2015-01-26 (×4): 500 mg via INTRAVENOUS
  Filled 2015-01-23 (×5): qty 500

## 2015-01-23 NOTE — H&P (Signed)
[facility] History and Physical  [name] QHU:[id number] DOB: 08-23-1971 DOA: 01/23/2015  Referring physician: ER PCP: No primary care provider on file.   Chief Complaint: Dyspnea. Cough.  HPI: [name] is a 44 y.o. female  This is a 44 year old lady who gives a two-week history of cough productive of yellow sputum associated with increasing dyspnea. She also describes some nonspecific chest pain. She does have a history of coronary artery disease with myocardial infarction in 2013. The chest pain does not resemble the pain she experienced from her coronary artery disease. She denies any hemoptysis. She quit smoking a couple months ago but prior to that had been smoking for many years. She has been feeling feverish. Evaluation in the emergency room shows findings consistent with pneumonia, possibly atypical. She is now being admitted for further management as she is requiring supplemental oxygen to maintain saturations at an acceptable level.   Review of Systems:  Apart from symptoms above, all systems negative.  Past Medical History  Diagnosis Date  . Asthma 2005  . Hypertension 2012  . Personal history of tobacco use, presenting hazards to health   . Myocardial infarction 10/2012  . Cancer 2012    breast cancer, right-treated with neoadjunvant chemotherapy followed by partial mastectomy with sn bx and radiation. Pt is currently taking Tamoxifen since January 2013   . Lump or mass in breast 2012    right  . Malignant neoplasm of upper-outer quadrant of female breast 2012    right   Past Surgical History  Procedure Laterality Date  . Portacath placement    . Cesarean section  [id number]  . Tubal ligation  2004  . Breast lumpectomy Right 2012  . Finger surgery Left 1986    reconstruction, left pinky  . Wrist surgery Left 1987  . Coronary angioplasty with stent placement  20123    Myocardial infarction  . Left heart catheterization with coronary angiogram N/A  10/16/2012    Procedure: LEFT HEART CATHETERIZATION WITH CORONARY ANGIOGRAM;  Surgeon: Clent Demark, MD;  Location: [facility] CATH LAB;  Service: Cardiovascular;  Laterality: N/A;   Social History:  reports that she has quit smoking. Her smoking use included Cigarettes. She has a 15 pack-year smoking history. She does not have any smokeless tobacco history on file. She reports that she does not drink alcohol or use illicit drugs.  Allergies  Allergen Reactions  . Codeine Nausea And Vomiting  . Penicillins Rash    Family History  Problem Relation Age of Onset  . Cancer Maternal Grandmother 59    breast    Prior to Admission medications   Medication Sig Start Date End Date Taking? Authorizing Provider  aspirin EC 81 MG EC tablet Take 1 tablet (81 mg total) by mouth daily. 10/19/12  Yes Charolette Forward, MD  ALPRAZolam Duanne Moron) 0.25 MG tablet Take 1 tablet (0.25 mg total) by mouth 2 (two) times daily as needed for anxiety. Patient not taking: Reported on 01/23/2015 10/19/12   Charolette Forward, MD  atorvastatin (LIPITOR) 80 MG tablet Take 1 tablet (80 mg total) by mouth daily at 6 PM. Patient not taking: Reported on 01/23/2015 10/19/12   Charolette Forward, MD  cyclobenzaprine (FLEXERIL) 5 MG tablet Take 1 tablet (5 mg total) by mouth 3 (three) times daily as needed for muscle spasms. Patient not taking: Reported on 01/23/2015 02/22/14   Evalee Jefferson, PA-C  ferrous sulfate 325 (65 FE) MG tablet Take 1 tablet (325 mg total) by mouth daily  with breakfast. Patient not taking: Reported on 01/23/2015 10/19/12   Charolette Forward, MD  metoprolol tartrate (LOPRESSOR) 12.5 mg TABS Take 0.5 tablets (12.5 mg total) by mouth 2 (two) times daily. Patient not taking: Reported on 01/23/2015 10/19/12   Charolette Forward, MD  nitroGLYCERIN (NITROSTAT) 0.4 MG SL tablet Place 1 tablet (0.4 mg total) under the tongue every 5 (five) minutes x 3 doses as needed for chest pain. Patient not taking: Reported on 01/23/2015 10/19/12   Charolette Forward, MD  predniSONE (DELTASONE) 10 MG tablet 6, 5, 4, 3, 2 then 1 tablet by mouth daily for 6 days total. Patient not taking: Reported on 01/23/2015 02/22/14   Evalee Jefferson, PA-C  ramipril (ALTACE) 2.5 MG capsule Take 1 capsule (2.5 mg total) by mouth daily. Patient not taking: Reported on 01/23/2015 10/19/12   Charolette Forward, MD  Ticagrelor (BRILINTA) 90 MG TABS tablet Take 1 tablet (90 mg total) by mouth 2 (two) times daily. Patient not taking: Reported on 01/23/2015 10/19/12   Charolette Forward, MD   Physical Exam: Filed Vitals:   01/23/15 1501 01/23/15 1545 01/23/15 1732 01/23/15 1740  BP: 166/115  130/99   Pulse: 131  125   Temp: 98.4 F (36.9 C)  99.2 F (37.3 C)   TempSrc: Oral  Oral   Resp: 32  21   Height: 5\' 2"  (1.575 m)     Weight: 54.432 kg (120 lb)     SpO2: 87% 96% 94% 94%    Wt Readings from Last 3 Encounters:  01/23/15 54.432 kg (120 lb)  02/22/14 56.7 kg (125 lb)  10/16/12 59.4 kg (130 lb 15.3 oz)    General:  Appears calm and comfortable. She does not look toxic or septic. There is no increased work of breathing at rest. Eyes: PERRL, normal lids, irises & conjunctiva ENT: grossly normal hearing, lips & tongue Neck: no LAD, masses or thyromegaly Cardiovascular: RRR, no m/r/g. No LE edema. Telemetry: SR, no arrhythmias  Respiratory: She has scattered crackles in both lungs, mainly in the middle and lower zones. There is no bronchial breathing, wheezing. Abdomen: soft, ntnd Skin: no rash or induration seen on limited exam Musculoskeletal: grossly normal tone BUE/BLE Psychiatric: grossly normal mood and affect, speech fluent and appropriate Neurologic: grossly non-focal.          Labs on Admission:  Basic Metabolic Panel:  Recent Labs Lab 01/23/15 1543  NA 137  K 3.0*  CL 97  CO2 28  GLUCOSE 123*  BUN 7  CREATININE 0.62  CALCIUM 8.8   Liver Function Tests:  Recent Labs Lab 01/23/15 1543  AST 14  ALT 10  ALKPHOS 76  BILITOT 0.8  PROT 7.5    ALBUMIN 3.2*   No results for input(s): LIPASE, AMYLASE in the last 168 hours. No results for input(s): AMMONIA in the last 168 hours. CBC:  Recent Labs Lab 01/23/15 1545  WBC 22.4*  NEUTROABS 20.2*  HGB 13.7  HCT 39.9  MCV 91.9  PLT 462*   Cardiac Enzymes:  Recent Labs Lab 01/23/15 1543  TROPONINI <0.03    BNP (last 3 results)  Recent Labs  01/23/15 1543  BNP 72.0    ProBNP (last 3 results) No results for input(s): PROBNP in the last 8760 hours.  CBG: No results for input(s): GLUCAP in the last 168 hours.  Radiological Exams on Admission: Dg Chest Portable 1 View  01/23/2015   CLINICAL DATA:  Inferior chest pain initial evaluation with shortness of breath since  2 a.m., productive cough for 2 weeks, history of asthma breast cancer myocardial infarction and hypertension  EXAM: PORTABLE CHEST - 1 VIEW  COMPARISON:  10/16/2012  FINDINGS: The heart size and vascular pattern are normal. There are mildly to moderately increased interstitial markings bilaterally which are new from the prior study. There is no consolidation. Minimal blunting left costophrenic angle suggests the possibility of a tiny effusion. Port-A-Cath in unchanged position.  IMPRESSION: Diffuse interstitial prominence new from the prior study. No cardiac enlargement or vascular congestion to suggest that this is related to cardiogenic pulmonary edema. Atypical opportunistic pneumonia is 1 possible cause. Drug reaction related to chemotherapy is another consideration.   Electronically Signed   By: Skipper Cliche M.D.   On: 01/23/2015 15:42      Assessment/Plan   1. Community-acquired pneumonia. The history is consistent with pneumonia. However the diffuse appearance on the chest x-ray is slightly concerning because she has a history of breast cancer and I would be concerned about metastatic spread. We will obtain a CT scan of the chest. We will start her on intravenous antibiotics. 2. Coronary artery  disease. Stable. 3. History of breast cancer.   Further recommendations will depend on patient's hospital progress.   Code Status: Full code.  DVT Prophylaxis: Heparin.  Family Communication: I discussed the plan with the patient at the bedside.   Disposition Plan: Home when medically stable.  Time spent: 60 minutes.  Doree Albee [facility] Pager [phone].

## 2015-01-23 NOTE — ED Provider Notes (Signed)
CSN: [id number]     Arrival date & time 01/23/15  1452 History  This chart was scribed for Milton Ferguson, MD by Einar Pheasant, Medical Scribe. This patient was seen in room APA19/APA19 and the patient's care was started at 3:06 PM.    Chief Complaint  Patient presents with  . Chest Pain  . Shortness of Breath   Patient is a 44 y.o. female presenting with chest pain and shortness of breath. The history is provided by the patient and medical records. No language interpreter was used.  Chest Pain Pain location:  Unable to specify Pain quality: aching   Pain radiates to:  Does not radiate Pain radiates to the back: no   Pain severity:  Mild Onset quality:  Gradual Duration:  12 hours Timing:  Intermittent Progression:  Unchanged Chronicity:  New Context comment:  Coughing Relieved by:  Nothing Worsened by:  Nothing tried Associated symptoms: shortness of breath   Associated symptoms: no abdominal pain, no back pain, no cough, no fatigue and no headache   Shortness of Breath Severity:  Severe Onset quality:  Gradual Duration:  2 weeks Timing:  Intermittent Progression:  Worsening Chronicity:  New Context: URI   Relieved by:  Nothing Associated symptoms: chest pain   Associated symptoms: no abdominal pain, no cough, no headaches and no rash    HPI Comments: [name] is a 44 y.o. female with PMhx of HTN, MI, and asthma presents to the Emergency Department complaining of gradual onset gradually worsening SOB that started 2 weeks ago but worsened this AM. Pt reports associated chest pain and productive cough. She denies taking any new medications. Pt quit smoking 2.5 months ago. Pt denies any  fever, neck pain, sore throat, visual disturbance, CP,  abdominal pain, nausea, emesis, diarrhea, urinary symptoms, back pain, HA, weakness, numbness and rash as associated symptoms.    Past Medical History  Diagnosis Date  . Asthma 2005  . Hypertension 2012  . Personal history of  tobacco use, presenting hazards to health   . Myocardial infarction 10/2012  . Cancer 2012    breast cancer, right-treated with neoadjunvant chemotherapy followed by partial mastectomy with sn bx and radiation. Pt is currently taking Tamoxifen since January 2013   . Lump or mass in breast 2012    right  . Malignant neoplasm of upper-outer quadrant of female breast 2012    right   Past Surgical History  Procedure Laterality Date  . Portacath placement    . Cesarean section  [id number]  . Tubal ligation  2004  . Breast lumpectomy Right 2012  . Finger surgery Left 1986    reconstruction, left pinky  . Wrist surgery Left 1987  . Coronary angioplasty with stent placement  20123    Myocardial infarction  . Left heart catheterization with coronary angiogram N/A 10/16/2012    Procedure: LEFT HEART CATHETERIZATION WITH CORONARY ANGIOGRAM;  Surgeon: Clent Demark, MD;  Location: [facility] CATH LAB;  Service: Cardiovascular;  Laterality: N/A;   Family History  Problem Relation Age of Onset  . Cancer Maternal Grandmother 83    breast   History  Substance Use Topics  . Smoking status: Former Smoker -- 1.00 packs/day for 15 years    Types: Cigarettes  . Smokeless tobacco: Not on file  . Alcohol Use: No   OB History    Gravida Para Term Preterm AB TAB SAB Ectopic Multiple Living   2         2  Obstetric Comments   Age with first menstruation-11 Age with first pregnancy-18 LMP-2012     Review of Systems  Constitutional: Negative for appetite change and fatigue.  HENT: Negative for congestion, ear discharge and sinus pressure.   Eyes: Negative for discharge.  Respiratory: Positive for shortness of breath. Negative for cough.   Cardiovascular: Positive for chest pain.  Gastrointestinal: Negative for abdominal pain and diarrhea.  Genitourinary: Negative for frequency and hematuria.  Musculoskeletal: Negative for back pain.  Skin: Negative for rash.  Neurological: Negative for  seizures and headaches.  Psychiatric/Behavioral: Negative for hallucinations.   Allergies  Codeine and Penicillins  Home Medications   Prior to Admission medications   Medication Sig Start Date End Date Taking? Authorizing Provider  ALPRAZolam (XANAX) 0.25 MG tablet Take 1 tablet (0.25 mg total) by mouth 2 (two) times daily as needed for anxiety. 10/19/12   Charolette Forward, MD  aspirin EC 81 MG EC tablet Take 1 tablet (81 mg total) by mouth daily. 10/19/12   Charolette Forward, MD  atorvastatin (LIPITOR) 80 MG tablet Take 1 tablet (80 mg total) by mouth daily at 6 PM. 10/19/12   Charolette Forward, MD  cyclobenzaprine (FLEXERIL) 5 MG tablet Take 1 tablet (5 mg total) by mouth 3 (three) times daily as needed for muscle spasms. 02/22/14   Evalee Jefferson, PA-C  ferrous sulfate 325 (65 FE) MG tablet Take 1 tablet (325 mg total) by mouth daily with breakfast. 10/19/12   Charolette Forward, MD  metoprolol tartrate (LOPRESSOR) 12.5 mg TABS Take 0.5 tablets (12.5 mg total) by mouth 2 (two) times daily. 10/19/12   Charolette Forward, MD  nitroGLYCERIN (NITROSTAT) 0.4 MG SL tablet Place 1 tablet (0.4 mg total) under the tongue every 5 (five) minutes x 3 doses as needed for chest pain. 10/19/12   Charolette Forward, MD  predniSONE (DELTASONE) 10 MG tablet 6, 5, 4, 3, 2 then 1 tablet by mouth daily for 6 days total. 02/22/14   Evalee Jefferson, PA-C  ramipril (ALTACE) 2.5 MG capsule Take 1 capsule (2.5 mg total) by mouth daily. 10/19/12   Charolette Forward, MD  Ticagrelor (BRILINTA) 90 MG TABS tablet Take 1 tablet (90 mg total) by mouth 2 (two) times daily. 10/19/12   Charolette Forward, MD   BP 166/115 mmHg  Pulse 131  Temp(Src) 98.4 F (36.9 C) (Oral)  Resp 32  Ht 5\' 2"  (1.575 m)  Wt 120 lb (54.432 kg)  BMI 21.94 kg/m2  SpO2 87%   Physical Exam  Constitutional: She is oriented to person, place, and time. She appears well-developed.  HENT:  Head: Normocephalic.  Eyes: Conjunctivae and EOM are normal. No scleral icterus.  Neck: Neck  supple. No thyromegaly present.  Cardiovascular: Regular rhythm.  Tachycardia present.  Exam reveals no gallop and no friction rub.   No murmur heard. Pulmonary/Chest: No stridor. She has wheezes (moderate wheezing bilaterally). She has no rales. She exhibits no tenderness.  Abdominal: She exhibits no distension. There is no tenderness. There is no rebound.  Musculoskeletal: Normal range of motion. She exhibits no edema.  Lymphadenopathy:    She has no cervical adenopathy.  Neurological: She is oriented to person, place, and time. She exhibits normal muscle tone. Coordination normal.  Skin: No rash noted. No erythema.  Psychiatric: She has a normal mood and affect. Her behavior is normal.    ED Course  Procedures (including critical care time)  DIAGNOSTIC STUDIES: Oxygen Saturation is 87% on [place], low by my interpretation.  COORDINATION OF CARE: 3:10 PM- Pt advised of plan for treatment and pt agrees.  Labs Review Labs Reviewed - No data to display  Imaging Review No results found.   EKG Interpretation   Date/Time:  Thursday January 23 2015 15:02:41 EDT Ventricular Rate:  132 PR Interval:  143 QRS Duration: 68 QT Interval:  306 QTC Calculation: 453 R Axis:   -47 Text Interpretation:  Sinus tachycardia Probable left atrial enlargement  Left anterior fascicular block Anterior infarct, old Confirmed by Rilie Glanz   MD, Cyree Chuong [phone]) on 01/23/2015 3:17:12 PM     CRITICAL CARE Performed by: Benino Korinek L Total critical care time: 30 Critical care time was exclusive of separately billable procedures and treating other patients. Critical care was necessary to treat or prevent imminent or life-threatening deterioration. Critical care was time spent personally by me on the following activities: development of treatment plan with patient and/or surrogate as well as nursing, discussions with consultants, evaluation of patient's response to treatment, examination of patient, obtaining  history from patient or surrogate, ordering and performing treatments and interventions, ordering and review of laboratory studies, ordering and review of radiographic studies, pulse oximetry and re-evaluation of patient's condition.   MDM   Final diagnoses:  None  The chart was scribed for me under my direct supervision.  I personally performed the history, physical, and medical decision making and all procedures in the evaluation of this patient.Milton Ferguson, MD 01/23/15 [phone]

## 2015-01-23 NOTE — ED Notes (Signed)
Patient O2 saturation 87% on room air. Respirtations 32. Patient has labored breathing at triage. Placed patient on 2L O2 via nasal canula. O2 saturation increased to 92%. Respirations decreased to 24.

## 2015-01-23 NOTE — ED Notes (Signed)
Patient complaining of epigastric chest pain and shortness of breath. States she has had cough x 2 weeks and is coughing up yellow sputum. States chest pain started early this morning.

## 2015-01-24 DIAGNOSIS — J9601 Acute respiratory failure with hypoxia: Secondary | ICD-10-CM

## 2015-01-24 LAB — CBC
HCT: 36.5 % (ref 36.0–46.0)
HEMOGLOBIN: 12.3 g/dL (ref 12.0–15.0)
MCH: 31.2 pg (ref 26.0–34.0)
MCHC: 33.7 g/dL (ref 30.0–36.0)
MCV: 92.6 fL (ref 78.0–100.0)
PLATELETS: 411 10*3/uL — AB (ref 150–400)
RBC: 3.94 MIL/uL (ref 3.87–5.11)
RDW: 13.7 % (ref 11.5–15.5)
WBC: 14 10*3/uL — ABNORMAL HIGH (ref 4.0–10.5)

## 2015-01-24 LAB — COMPREHENSIVE METABOLIC PANEL
ALT: 9 U/L (ref 0–35)
AST: 13 U/L (ref 0–37)
Albumin: 2.9 g/dL — ABNORMAL LOW (ref 3.5–5.2)
Alkaline Phosphatase: 72 U/L (ref 39–117)
Anion gap: 13 (ref 5–15)
BUN: 6 mg/dL (ref 6–23)
CALCIUM: 8.6 mg/dL (ref 8.4–10.5)
CO2: 30 mmol/L (ref 19–32)
Chloride: 96 mmol/L (ref 96–112)
Creatinine, Ser: 0.62 mg/dL (ref 0.50–1.10)
GFR calc non Af Amer: >90 mL/min (ref >90)
GLUCOSE: 114 mg/dL — AB (ref 70–99)
Potassium: 3.1 mmol/L — ABNORMAL LOW (ref 3.5–5.1)
SODIUM: 139 mmol/L (ref 135–145)
Total Bilirubin: 0.6 mg/dL (ref 0.3–1.2)
Total Protein: 7 g/dL (ref 6.0–8.3)

## 2015-01-24 LAB — INFLUENZA PANEL BY PCR (TYPE A & B)
H1N1 flu by pcr: NOT DETECTED
Influenza A By PCR: NEGATIVE
Influenza B By PCR: NEGATIVE

## 2015-01-24 MED ORDER — ONDANSETRON HCL 4 MG/2ML IJ SOLN
4.0000 mg | Freq: Four times a day (QID) | INTRAMUSCULAR | Status: DC | PRN
  Administered 2015-01-24: 4 mg via INTRAVENOUS
  Filled 2015-01-24: qty 2

## 2015-01-24 MED ORDER — DEXTROSE 5 % IV SOLN
1.0000 g | INTRAVENOUS | Status: DC
  Administered 2015-01-24 – 2015-01-26 (×3): 1 g via INTRAVENOUS
  Filled 2015-01-24 (×4): qty 10

## 2015-01-24 MED ORDER — IPRATROPIUM BROMIDE 0.02 % IN SOLN
RESPIRATORY_TRACT | Status: AC
  Administered 2015-01-24: 0.5 mg
  Filled 2015-01-24: qty 2.5

## 2015-01-24 MED ORDER — POTASSIUM CHLORIDE CRYS ER 20 MEQ PO TBCR
40.0000 meq | EXTENDED_RELEASE_TABLET | Freq: Two times a day (BID) | ORAL | Status: AC
  Administered 2015-01-24 (×2): 40 meq via ORAL
  Filled 2015-01-24 (×2): qty 2

## 2015-01-24 MED ORDER — ALBUTEROL SULFATE (2.5 MG/3ML) 0.083% IN NEBU
INHALATION_SOLUTION | RESPIRATORY_TRACT | Status: AC
  Administered 2015-01-24: 2.5 mg
  Filled 2015-01-24: qty 3

## 2015-01-24 NOTE — Care Management Note (Addendum)
    Page 1 of 1   01/27/2015     12:20:44 PM CARE MANAGEMENT NOTE 01/27/2015  Patient:  [name], [name]   Account Number:  [id number]  Date Initiated:  01/24/2015  Documentation initiated by:  Jolene Provost  Subjective/Objective Assessment:   Pt is from home, lives with husband and children. Pt is independent at  baseline with no HH services or DME's. Pt was scheduld for appointment at [facility] prior to admission. Will need to follow up there after discharge     Action/Plan:   Anticipated DC Date:  01/24/2015   Anticipated DC Plan:  [place]  CM consult      Choice offered to / List presented to:             Status of service:  Completed, signed off Medicare Important Message given?   (If response is "NO", the following Medicare IM given date fields will be blank) Date Medicare IM given:   Medicare IM given by:   Date Additional Medicare IM given:   Additional Medicare IM given by:    Discharge Disposition:  HOME/SELF CARE  Per UR Regulation:  Reviewed for med. necessity/level of care/duration of stay  If discussed at [place] of Stay Meetings, dates discussed:    Comments:  01/27/15 [place], RN BSN CM Pt discharged home today. Pt stated that she could afford her medications since they are on the $4 list at [facility]. Pt also does not qualify for home O2 at  this time. Bedside RN to make followup appt at [facility] prior to discharge.  01/24/2015 [place], RN, MSN, CM

## 2015-01-24 NOTE — Care Management Utilization Note (Signed)
UR completed 

## 2015-01-24 NOTE — Progress Notes (Signed)
  PROGRESS NOTE  [name] PJA:[id number] DOB: 07/31/1971 DOA: 01/23/2015 PCP: No primary care provider on file.  Summary: 44 year old woman with two-week history of productive cough and increasing shortness of breath as well as nonspecific chest pain. Workup suggested pneumonia and she was admitted for further management of this as well as acute hypoxic respiratory failure.  Assessment/Plan: 1. Acute hypoxic respiratory failure secondary to pneumonia. 2. Community acquired pneumonia. Afebrile, stable o2 requirement.  3. Breast cancer treated with chemotherapy, partial mastectomy, radiation 2012.  4. Right middle lobe irregular rounded density may represent focal inflammation or atelectasis but cannot exclude metastatic disease or neoplasm given history of breast cancer. Follow-up CT scan in 2 weeks, approximately April 2. Based on history and exam, suspect pneumonia.    Overall improving, continue abx  Check flu (doubt). Droplet precautions until negative  Wean oxygen  Home likely next 48 hours if continues to improve.  Code Status: full DVT prophylaxis: heparin Family Communication: none Disposition Plan: home  Murray Hodgkins, MD  [facility]  Pager [phone] If 7PM-7AM, please contact night-coverage at [email or web address], password [facility] 01/24/2015, 3:27 PM  LOS: 1 day   Consultants:    Procedures:    Antibiotics:  Ceftriaxone 3/17 >>  Zithromax 3/17 >>  HPI/Subjective: Feeling better, breathing better, still coughing, some pain. Eating well.  Objective: Filed Vitals:   01/23/15 1740 01/23/15 1945 01/23/15 1946 01/24/15 0510  BP:  138/93  135/90  Pulse:  114  95  Temp:  98 F (36.7 C)  97.7 F (36.5 C)  TempSrc:  Oral  Oral  Resp:    20  Height:   5\' 2"  (1.575 m)   Weight:   54.432 kg (120 lb)   SpO2: 94% 93%  97%   No intake or output data in the 24 hours ending 01/24/15 1527   Filed Weights   01/23/15 1501 01/23/15 1946  Weight: 54.432 kg  (120 lb) 54.432 kg (120 lb)    Exam:     Afebrile, vital signs are stable. Oxygenation stable on nasal cannula. General:  Appears calm and comfortable Cardiovascular: RRR, no m/r/g. No LE edema. Respiratory: bilateral rhonchi, no wheezes are rales. Speaks in full sentences. Normal respiratory effort. Psychiatric: grossly normal mood and affect, speech fluent and appropriate  Data Reviewed:  Potassium 3.1, complete metabolic panel otherwise unremarkable  Lactic acid within normal limits on admission  WBC 22.4 >> 14.0  Blood cultures pending  Chest CT: Diffuse reticular and nodular densities are seen throughout both lungs which may represent acute hypersensitivity reaction to medication, atypical inflammation such as mycobacterium, viral interstitial pneumonitis or possibly chronic interstitial lung disease.  Scheduled Meds: . aspirin EC  81 mg Oral Daily  . atorvastatin  80 mg Oral q1800  . azithromycin  500 mg Intravenous Q24H  . cefTRIAXone (ROCEPHIN)  IV  1 g Intravenous Q24H  . heparin  5,000 Units Subcutaneous 3 times per day  . metoprolol tartrate  12.5 mg Oral BID  . potassium chloride  40 mEq Oral BID  . ramipril  2.5 mg Oral Daily  . ticagrelor  90 mg Oral BID   Continuous Infusions: . sodium chloride      Principal Problem:   Acute respiratory failure with hypoxia Active Problems:   Myocardial infarction   Malignant neoplasm of upper-outer quadrant of female breast   Community acquired pneumonia   Time spent 20 minutes

## 2015-01-25 LAB — CBC
HCT: 34.4 % — ABNORMAL LOW (ref 36.0–46.0)
Hemoglobin: 11.2 g/dL — ABNORMAL LOW (ref 12.0–15.0)
MCH: 30.8 pg (ref 26.0–34.0)
MCHC: 32.6 g/dL (ref 30.0–36.0)
MCV: 94.5 fL (ref 78.0–100.0)
PLATELETS: 374 10*3/uL (ref 150–400)
RBC: 3.64 MIL/uL — ABNORMAL LOW (ref 3.87–5.11)
RDW: 13.9 % (ref 11.5–15.5)
WBC: 11.8 10*3/uL — ABNORMAL HIGH (ref 4.0–10.5)

## 2015-01-25 LAB — HIV ANTIBODY (ROUTINE TESTING W REFLEX): HIV Screen 4th Generation wRfx: NONREACTIVE

## 2015-01-25 MED ORDER — IPRATROPIUM-ALBUTEROL 0.5-2.5 (3) MG/3ML IN SOLN
3.0000 mL | Freq: Three times a day (TID) | RESPIRATORY_TRACT | Status: DC
  Administered 2015-01-25 – 2015-01-27 (×7): 3 mL via RESPIRATORY_TRACT
  Filled 2015-01-25 (×7): qty 3

## 2015-01-25 MED ORDER — ALBUTEROL SULFATE (2.5 MG/3ML) 0.083% IN NEBU
2.5000 mg | INHALATION_SOLUTION | RESPIRATORY_TRACT | Status: DC | PRN
  Administered 2015-01-25: 2.5 mg via RESPIRATORY_TRACT
  Filled 2015-01-25: qty 3

## 2015-01-25 MED ORDER — IPRATROPIUM-ALBUTEROL 0.5-2.5 (3) MG/3ML IN SOLN: 3.0000 mL | Freq: Two times a day (BID) | RESPIRATORY_TRACT | Status: DC

## 2015-01-25 MED ORDER — GUAIFENESIN-DM 100-10 MG/5ML PO SYRP
5.0000 mL | ORAL_SOLUTION | ORAL | Status: DC | PRN
  Administered 2015-01-25: 5 mL via ORAL
  Filled 2015-01-25: qty 5

## 2015-01-25 NOTE — Progress Notes (Signed)
Patient 02 sats 91% at rest room air. Patient ambulated room air, O2 sats dropped to 84%. Oxygen reapplied, O2 sats 95%.

## 2015-01-25 NOTE — Progress Notes (Signed)
PROGRESS NOTE  [name] MPN:[id number] DOB: 1971-07-29 DOA: 01/23/2015 PCP: No primary care provider on file.  Summary: 44 year old woman with two-week history of productive cough and increasing shortness of breath as well as nonspecific chest pain. Workup suggested pneumonia and she was admitted for further management of this as well as acute hypoxic respiratory failure.  Assessment/Plan: 1. Acute hypoxic respiratory failure secondary to pneumonia. Resolved. 2. Community acquired pneumonia. Continues to improve. WBC trending down, remains afebrile, minimal hypoxia.  3. Breast cancer treated with chemotherapy, partial mastectomy, radiation 2012.  4. Right middle lobe irregular rounded density may represent focal inflammation or atelectasis but cannot exclude metastatic disease or neoplasm given history of breast cancer. Follow-up CT scan in 2 weeks, approximately April 2. Based on history and exam, suspect pneumonia.    Overall continues to improve, responding to abx, hypoxia has resolved.  Home likely tomorrow if continues to improve  Code Status: full DVT prophylaxis: heparin Family Communication: none Disposition Plan: home  Murray Hodgkins, MD  [facility]  Pager [phone] If 7PM-7AM, please contact night-coverage at [email or web address], password [facility] 01/25/2015, 12:12 PM  LOS: 2 days   Consultants:    Procedures:    Antibiotics:  Ceftriaxone 3/17 >>  Zithromax 3/17 >>  HPI/Subjective: Feeling better, breathing better. A little bit of cough. No nausea, vomiting. Eating well.  Objective: Filed Vitals:   01/24/15 1911 01/24/15 2059 01/25/15 0552 01/25/15 0735  BP:  138/87 123/83   Pulse:  101 99   Temp:  98.3 F (36.8 C) 98.4 F (36.9 C)   TempSrc:  Oral Oral   Resp:  20 20   Height:      Weight:      SpO2: 92% 97% 100% 99%    Intake/Output Summary (Last 24 hours) at 01/25/15 1212 Last data filed at 01/24/15 1305  Gross per 24 hour  Intake    240  ml  Output      0 ml  Net    240 ml     Filed Weights   01/23/15 1501 01/23/15 1946  Weight: 54.432 kg (120 lb) 54.432 kg (120 lb)    Exam:     Afebrile, VSS. General:  Appears calm and comfortable Cardiovascular: RRR, no m/r/g. No LE edema. Telemetry: SR, no arrhythmias  Respiratory: bilateral rhonchi, no rales or wheezes. Normal respiratory effort. Speaks in full sentences Psychiatric: grossly normal mood and affect, speech fluent and appropriate  Data Reviewed:  Lactic acid within normal limits on admission  WBC 22.4 >> 14.0 >> 11.8  Influenza PCR negative  Blood cultures no growth x2 days  Chest CT: Diffuse reticular and nodular densities are seen throughout both lungs which may represent acute hypersensitivity reaction to medication, atypical inflammation such as mycobacterium, viral interstitial pneumonitis or possibly chronic interstitial lung disease.  Scheduled Meds: . aspirin EC  81 mg Oral Daily  . atorvastatin  80 mg Oral q1800  . azithromycin  500 mg Intravenous Q24H  . cefTRIAXone (ROCEPHIN)  IV  1 g Intravenous Q24H  . heparin  5,000 Units Subcutaneous 3 times per day  . ipratropium-albuterol  3 mL Nebulization TID  . metoprolol tartrate  12.5 mg Oral BID  . ramipril  2.5 mg Oral Daily  . ticagrelor  90 mg Oral BID   Continuous Infusions:    Principal Problem:   Acute respiratory failure with hypoxia Active Problems:   Myocardial infarction   Malignant neoplasm of upper-outer quadrant of female breast   Community  acquired pneumonia   Time spent 15 minutes

## 2015-01-26 MED ORDER — CALCIUM CARBONATE ANTACID 500 MG PO CHEW
1.0000 | CHEWABLE_TABLET | Freq: Three times a day (TID) | ORAL | Status: DC | PRN
  Administered 2015-01-26 – 2015-01-27 (×2): 200 mg via ORAL
  Filled 2015-01-26 (×2): qty 1

## 2015-01-26 NOTE — Progress Notes (Signed)
Patient O2 sat at rest 96 %. Pt ambulated without oxygen and at 172ft O2 saturation dropped to 88% on room air. Pt was later ambulated on 2L O2 via nasal cannula and O2 97%. Will continue to monitor patient.

## 2015-01-26 NOTE — Progress Notes (Signed)
PROGRESS NOTE  [name] KCL:[id number] DOB: Oct 06, 1971 DOA: 01/23/2015 PCP: No primary care provider on file.  Summary: 44 year old woman with two-week history of productive cough and increasing shortness of breath as well as nonspecific chest pain. Workup suggested pneumonia and she was admitted for further management of this as well as acute hypoxic respiratory failure.  Assessment/Plan: 1. Acute hypoxic respiratory failure secondary to pneumonia. Remains hypoxic with ambulation. 2. Community acquired pneumonia. Improving. WBC trended down. Remains afebrile. Hypoxia improved. 3. Breast cancer treated with chemotherapy, partial mastectomy, radiation 2012.  4. Right middle lobe irregular rounded density may represent focal inflammation or atelectasis but cannot exclude metastatic disease or neoplasm given history of breast cancer. Follow-up CT scan in 2 weeks, approximately April 2. Based on history and exam, suspect pneumonia.    Overall improving, afebrile, decreasing WBC, symptomatically improved.  May need home oxygen on discharge.  Follow-up CT scan in 2 weeks to ensure resolution. Differential includes atypical bacterial pneumonia versus viral process. Not on any current medications likely to cause pulmonary drug reaction.  Recheck in AM, likely home at that time, possibly on oxygen  Code Status: full DVT prophylaxis: heparin Family Communication: none Disposition Plan: home  Murray Hodgkins, MD  [facility]  Pager [phone] If 7PM-7AM, please contact night-coverage at [email or web address], password [facility] 01/26/2015, 10:20 AM  LOS: 3 days   Consultants:    Procedures:    Antibiotics:  Ceftriaxone 3/17 >>  Zithromax 3/17 >>  HPI/Subjective: Was hypoxic on RA with ambulation down to 88%. With 2L oxygen SpO2 up to 97%.  Feeling better, breathing better, little cough but some at night. Eating ok.  Objective: Filed Vitals:   01/26/15 0729 01/26/15 0938  01/26/15 0939 01/26/15 0940  BP:      Pulse:      Temp:      TempSrc:      Resp:      Height:      Weight:      SpO2: 100% 96% 88% 97%    Intake/Output Summary (Last 24 hours) at 01/26/15 1020 Last data filed at 01/26/15 0517  Gross per 24 hour  Intake    540 ml  Output      0 ml  Net    540 ml     Filed Weights   01/23/15 1501 01/23/15 1946  Weight: 54.432 kg (120 lb) 54.432 kg (120 lb)    Exam:     Afebrile, VSS. Hypoxic on room air with ambulation. General:  Appears comfortable Cardiovascular: Regular rate and rhythm, no murmur, rub or gallop. No lower extremity edema. Respiratory: bilateral rhochi and some wheezes. Fair air movement. Normal respiratory effort. Speaks in full sentences.  Psychiatric: grossly normal mood and affect, speech fluent and appropriate  Data Reviewed:  No new laboratory studies today  Lactic acid within normal limits on admission  WBC 22.4 >> 14.0 >> 11.8  Influenza PCR negative  Blood cultures no growth x3 days  Chest CT: Diffuse reticular and nodular densities are seen throughout both lungs which may represent acute hypersensitivity reaction to medication, atypical inflammation such as mycobacterium, viral interstitial pneumonitis or possibly chronic interstitial lung disease.  Scheduled Meds: . aspirin EC  81 mg Oral Daily  . atorvastatin  80 mg Oral q1800  . azithromycin  500 mg Intravenous Q24H  . cefTRIAXone (ROCEPHIN)  IV  1 g Intravenous Q24H  . heparin  5,000 Units Subcutaneous 3 times per day  . ipratropium-albuterol  3 mL Nebulization  TID  . metoprolol tartrate  12.5 mg Oral BID  . ramipril  2.5 mg Oral Daily  . ticagrelor  90 mg Oral BID   Continuous Infusions:    Principal Problem:   Acute respiratory failure with hypoxia Active Problems:   Myocardial infarction   Malignant neoplasm of upper-outer quadrant of female breast   Community acquired pneumonia   Time spent 20 minutes

## 2015-01-27 LAB — LEGIONELLA ANTIGEN, URINE

## 2015-01-27 MED ORDER — ALBUTEROL SULFATE HFA 108 (90 BASE) MCG/ACT IN AERS: 2.0000 | INHALATION_SPRAY | Freq: Four times a day (QID) | RESPIRATORY_TRACT | Status: DC | PRN

## 2015-01-27 MED ORDER — HEPARIN SOD (PORK) LOCK FLUSH 100 UNIT/ML IV SOLN
500.0000 [IU] | INTRAVENOUS | Status: AC | PRN
  Administered 2015-01-27: 500 [IU]
  Filled 2015-01-27: qty 5

## 2015-01-27 MED ORDER — NITROGLYCERIN 0.4 MG SL SUBL: 0.4000 mg | SUBLINGUAL_TABLET | SUBLINGUAL | Status: DC | PRN

## 2015-01-27 MED ORDER — SODIUM CHLORIDE 0.9 % IJ SOLN: 10.0000 mL | INTRAMUSCULAR | Status: DC | PRN

## 2015-01-27 MED ORDER — METOPROLOL TARTRATE 25 MG PO TABS: 12.5000 mg | ORAL_TABLET | Freq: Two times a day (BID) | ORAL | Status: DC

## 2015-01-27 MED ORDER — CEFUROXIME AXETIL 500 MG PO TABS: 500.0000 mg | ORAL_TABLET | Freq: Two times a day (BID) | ORAL | Status: DC

## 2015-01-27 MED ORDER — CEFUROXIME AXETIL 250 MG PO TABS: 500.0000 mg | ORAL_TABLET | Freq: Two times a day (BID) | ORAL | Status: DC

## 2015-01-27 MED ORDER — LISINOPRIL 5 MG PO TABS: 5.0000 mg | ORAL_TABLET | Freq: Every day | ORAL | Status: DC

## 2015-01-27 MED ORDER — AZITHROMYCIN 250 MG PO TABS
500.0000 mg | ORAL_TABLET | Freq: Once | ORAL | Status: AC
  Administered 2015-01-27: 500 mg via ORAL
  Filled 2015-01-27: qty 2

## 2015-01-27 NOTE — Progress Notes (Signed)
PROGRESS NOTE  [name] HCW:[id number] DOB: 1970/11/09 DOA: 01/23/2015 PCP: No primary care provider on file.  [facility]  Summary: 44 year old woman with two-week history of productive cough and increasing shortness of breath as well as nonspecific chest pain. Workup suggested pneumonia and she was admitted for further management of this as well as acute hypoxic respiratory failure.  Assessment/Plan: 1. Acute hypoxic respiratory failure secondary to pneumonia. Resolved.  2. Community acquired pneumonia. Improving. WBC trended down. Remains afebrile. Hypoxia resolved. 3. Breast cancer treated with chemotherapy, partial mastectomy, radiation 2012.  4. Right middle lobe irregular rounded density may represent focal inflammation or atelectasis but cannot exclude metastatic disease or neoplasm given history of breast cancer. Follow-up CT scan in 2 weeks, approximately April 2. Based on history and exam, suspect pneumonia.    Home today on oral abx  F/u with [facility] in 2 weeks (patient will make appointment). Will fax discharge summary and recs to office.  Suggest f/u with Dr. Terrence Dupont, patient will make appointment.  She has not taken any medicatinos except ASA for some time. No Brillanta for at least 6 monhtes.   Murray Hodgkins, MD  [facility]  Pager [phone] If 7PM-7AM, please contact night-coverage at [email or web address], password [facility] 01/27/2015, 11:29 AM  LOS: 4 days   Consultants:    Procedures:    Antibiotics:  Ceftriaxone 3/17 >> 3/20  Ceftin 3/21 >> 3/23  Zithromax 3/17 >> 3/21  HPI/Subjective: Patient 02 sat at with 02 at 2l is 99% and without 02 is 95%. Patient 02 sat ambulating without 02 is 89%  Feeling better, breathing better. Ready to go home.  Objective: Filed Vitals:   01/26/15 2038 01/26/15 2126 01/27/15 0445 01/27/15 0733  BP:  141/95 143/92   Pulse:  101 99   Temp:  98 F (36.7 C) 98.5 F (36.9 C)   TempSrc:  Oral Oral     Resp:  20 20   Height:      Weight:      SpO2: 99% 94% 98% 95%    Intake/Output Summary (Last 24 hours) at 01/27/15 1129 Last data filed at 01/26/15 1834  Gross per 24 hour  Intake    560 ml  Output      0 ml  Net    560 ml     Filed Weights   01/23/15 1501 01/23/15 1946  Weight: 54.432 kg (120 lb) 54.432 kg (120 lb)    Exam:     Afebrile, VSS.  General:  Appears calm and comfortable Cardiovascular: RRR, no m/r/g.  Respiratory: good air movement, few rhonchi. Normal respiratory effort. Speaks in full sentences. Psychiatric: grossly normal mood and affect, speech fluent and appropriate  Data Reviewed:  No new laboratory studies today  Lactic acid within normal limits on admission  WBC 22.4 >> 14.0 >> 11.8  Influenza PCR negative  Blood cultures no growth x3 days  Chest CT: Diffuse reticular and nodular densities are seen throughout both lungs which may represent acute hypersensitivity reaction to medication, atypical inflammation such as mycobacterium, viral interstitial pneumonitis or possibly chronic interstitial lung disease.  Scheduled Meds: . aspirin EC  81 mg Oral Daily  . atorvastatin  80 mg Oral q1800  . azithromycin  500 mg Intravenous Q24H  . cefTRIAXone (ROCEPHIN)  IV  1 g Intravenous Q24H  . heparin  5,000 Units Subcutaneous 3 times per day  . ipratropium-albuterol  3 mL Nebulization TID  . metoprolol tartrate  12.5 mg Oral BID  .  ramipril  2.5 mg Oral Daily  . ticagrelor  90 mg Oral BID   Continuous Infusions:    Principal Problem:   Acute respiratory failure with hypoxia Active Problems:   Myocardial infarction   Malignant neoplasm of upper-outer quadrant of female breast   Community acquired pneumonia

## 2015-01-27 NOTE — Progress Notes (Signed)
Patient states understanding of discharge instructions,prescriptions given and portacath deacessed sited wnl

## 2015-01-27 NOTE — Discharge Summary (Signed)
Physician Discharge Summary  JEYLA BULGER ZYY:[id number] DOB: Dec 26, 1970 DOA: 01/23/2015  PCP: [facility] Cardiologist: Dr. Terrence Dupont   Admit date: 01/23/2015 Discharge date: 01/27/2015  Recommendations for Outpatient Follow-up:  1. Resolution of community-acquired pneumonia 2. Right middle lobe irregular rounded density may represent focal inflammation or atelectasis but cannot exclude metastatic disease or neoplasm given history of breast cancer. Follow-up CT scan in 2 weeks, approximately April 2. Based on history and exam, suspect pneumonia. See discussion below. 3. She stopped taking all her medications including her cardiac medications, including antiplatelet therapy months ago. I recommended she continue aspirin and discuss her care with her cardiologist in follow up with him in the near future.    Follow-up Information    Follow up with [facility] On 02/10/2015.   Why:  at 10:00 am.  [employer] card and current mediations.   Contact information:   [address] [place] [address] [phone]       Follow up with Clent Demark, MD. Schedule an appointment as soon as possible for a visit in 2 weeks.   Specialty:  Cardiology   Contact information:   [place] [address] [place] [address] [phone]      Discharge Diagnoses:  1. Acute hypoxic respiratory failure secondary to pneumonia 2. Community-acquired pneumonia 3. Breast cancer treated 2012 4. Right middle lobe abnormality  Discharge Condition: Improved Disposition: Home  Diet recommendation: Heart healthy  Filed Weights   01/23/15 1501 01/23/15 1946  Weight: 54.432 kg (120 lb) 54.432 kg (120 lb)    History of present illness:  44 year old woman with two-week history of productive cough and increasing shortness of breath as well as nonspecific chest pain. Workup suggested pneumonia and she was admitted for further management of this as  well as acute hypoxic respiratory failure.  Hospital Course:  Ms. Okubo was treated with empiric antibiotics with rapid improvement in pneumonia. Hypoxic respiratory failure resolved. CT findings were abnormal, see below, however leukocytosis and symptoms resolved with treatment with antibiotics suggesting bacterial etiology. She is only taking aspirin and so drug reaction would be unlikely. Viral pneumonia possibility but seems less likely   Acute hypoxic respiratory failure secondary to pneumonia. Resolved.   Community acquired pneumonia. Improving. WBC trended down. Remains afebrile. Hypoxia resolved.  Breast cancer treated with chemotherapy, partial mastectomy, radiation 2012.   Right middle lobe irregular rounded density may represent focal inflammation or atelectasis but cannot exclude metastatic disease or neoplasm given history of breast cancer. Follow-up CT scan in 2 weeks, approximately April 2. Based on history and exam, suspect pneumonia.   I discussed the abnormal CT findings with the patient and strongly recommended follow-up with her primary care team and repeat CT imaging, she is aware of these recommendations. I still think at this point this represents community acquired pneumonia.  Antibiotics:  Ceftriaxone 3/17 >> 3/20  Ceftin 3/21 >> 3/23  Zithromax 3/17 >> 3/21  Discharge Instructions  Discharge Instructions    Activity as tolerated - No restrictions    Complete by:  As directed      Diet - low sodium heart healthy    Complete by:  As directed      Discharge instructions    Complete by:  As directed   Call your physician or seek immediate medical attention for fever, shortness of breath, pain or worsening of condition. Please contact Dr. Terrence Dupont to discuss your heart medications.  Current Discharge Medication List    START taking these medications   Details  albuterol (PROVENTIL HFA;VENTOLIN HFA) 108 (90 BASE) MCG/ACT inhaler Inhale 2 puffs  into the lungs every 6 (six) hours as needed for wheezing or shortness of breath. Qty: 1 Inhaler, Refills: 0    cefUROXime (CEFTIN) 500 MG tablet Take 1 tablet (500 mg total) by mouth 2 (two) times daily with a meal. Qty: 5 tablet, Refills: 0    lisinopril (PRINIVIL,ZESTRIL) 5 MG tablet Take 1 tablet (5 mg total) by mouth daily. Qty: 30 tablet, Refills: 0      CONTINUE these medications which have CHANGED   Details  metoprolol tartrate (LOPRESSOR) 25 MG tablet Take 0.5 tablets (12.5 mg total) by mouth 2 (two) times daily. Qty: 30 tablet, Refills: 0    nitroGLYCERIN (NITROSTAT) 0.4 MG SL tablet Place 1 tablet (0.4 mg total) under the tongue every 5 (five) minutes x 3 doses as needed for chest pain. Qty: 30 tablet, Refills: 0      CONTINUE these medications which have NOT CHANGED   Details  aspirin EC 81 MG EC tablet Take 1 tablet (81 mg total) by mouth daily. Qty: 30 tablet, Refills: 3      STOP taking these medications     ALPRAZolam (XANAX) 0.25 MG tablet      atorvastatin (LIPITOR) 80 MG tablet      cyclobenzaprine (FLEXERIL) 5 MG tablet      ferrous sulfate 325 (65 FE) MG tablet      predniSONE (DELTASONE) 10 MG tablet      ramipril (ALTACE) 2.5 MG capsule      Ticagrelor (BRILINTA) 90 MG TABS tablet        Note, patient has not taken any of these medications in months.  Allergies  Allergen Reactions  . Codeine Nausea And Vomiting  . Penicillins Rash    The results of significant diagnostics from this hospitalization (including imaging, microbiology, ancillary and laboratory) are listed below for reference.    Significant Diagnostic Studies: Ct Chest W Contrast  01/23/2015   CLINICAL DATA:  Productive cough for 2 weeks with increasing dyspnea.  EXAM: CT CHEST WITH CONTRAST  TECHNIQUE: Multidetector CT imaging of the chest was performed during intravenous contrast administration.  CONTRAST:  67mL OMNIPAQUE IOHEXOL 300 MG/ML  SOLN  COMPARISON:  Chest radiograph  of same day.  FINDINGS: No pneumothorax or pleural effusion is noted. There are diffuse fine reticular and occasionally nodular densities seen throughout both lungs. This may represent acute hypersensitivity reaction to medication, atypical pneumonitis or viral inflammation, or possibly chronic interstitial lung disease. Atelectasis is noted anteriorly in the inferior portions of the right middle and lingular lobes. 19 x 13 mm irregular rounded density is noted in the inferior portion the right middle lobe which may represent focal inflammation, atelectasis or possibly neoplasm. No mediastinal mass or adenopathy is noted. Coronary artery stent is noted. There is no evidence thoracic aortic dissection or aneurysm. Visualized portion of upper abdomen appears normal. Left subclavian Port-A-Cath is noted with tip at expected position of cavoatrial junction.  IMPRESSION: Diffuse reticular and nodular densities are seen throughout both lungs which may represent acute hypersensitivity reaction to medication, atypical inflammation such as mycobacterium, viral interstitial pneumonitis or possibly chronic interstitial lung disease.  19 x 13 mm irregular rounded density is noted in the inferior portion of the right middle lobe anteriorly which may represent focal inflammation or atelectasis, but possible metastatic disease or neoplasm cannot be excluded  given the history of breast cancer. Followup CT scan in 10-14 days is recommended to ensure resolution.   Electronically Signed   By: Marijo Conception, M.D.   On: 01/23/2015 19:14   Dg Chest Portable 1 View  01/23/2015   CLINICAL DATA:  Inferior chest pain initial evaluation with shortness of breath since 2 a.m., productive cough for 2 weeks, history of asthma breast cancer myocardial infarction and hypertension  EXAM: PORTABLE CHEST - 1 VIEW  COMPARISON:  10/16/2012  FINDINGS: The heart size and vascular pattern are normal. There are mildly to moderately increased  interstitial markings bilaterally which are new from the prior study. There is no consolidation. Minimal blunting left costophrenic angle suggests the possibility of a tiny effusion. Port-A-Cath in unchanged position.  IMPRESSION: Diffuse interstitial prominence new from the prior study. No cardiac enlargement or vascular congestion to suggest that this is related to cardiogenic pulmonary edema. Atypical opportunistic pneumonia is 1 possible cause. Drug reaction related to chemotherapy is another consideration.   Electronically Signed   By: Skipper Cliche M.D.   On: 01/23/2015 15:42    Microbiology: Recent Results (from the past 240 hour(s))  Blood culture (routine x 2)     Status: None (Preliminary result)   Collection Time: 01/23/15  3:35 PM  Result Value Ref Range Status   Specimen Description BLOOD RIGHT ARM  Final   Special Requests BOTTLES DRAWN AEROBIC AND ANAEROBIC 6CC  Final   Culture NO GROWTH 4 DAYS  Final   Report Status PENDING  Incomplete  Blood culture (routine x 2)     Status: None (Preliminary result)   Collection Time: 01/23/15  3:43 PM  Result Value Ref Range Status   Specimen Description BLOOD LEFT HAND  Final   Special Requests BOTTLES DRAWN AEROBIC AND ANAEROBIC 6CC  Final   Culture NO GROWTH 4 DAYS  Final   Report Status PENDING  Incomplete     Labs: Basic Metabolic Panel:  Recent Labs Lab 01/23/15 1543 01/24/15 0705  NA 137 139  K 3.0* 3.1*  CL 97 96  CO2 28 30  GLUCOSE 123* 114*  BUN 7 6  CREATININE 0.62 0.62  CALCIUM 8.8 8.6   Liver Function Tests:  Recent Labs Lab 01/23/15 1543 01/24/15 0705  AST 14 13  ALT 10 9  ALKPHOS 76 72  BILITOT 0.8 0.6  PROT 7.5 7.0  ALBUMIN 3.2* 2.9*   CBC:  Recent Labs Lab 01/23/15 1545 01/24/15 0705 01/25/15 0646  WBC 22.4* 14.0* 11.8*  NEUTROABS 20.2*  --   --   HGB 13.7 12.3 11.2*  HCT 39.9 36.5 34.4*  MCV 91.9 92.6 94.5  PLT 462* 411* 374   Cardiac Enzymes:  Recent Labs Lab 01/23/15 1543    TROPONINI <0.03     Recent Labs  01/23/15 1543  BNP 72.0     Principal Problem:   Acute respiratory failure with hypoxia Active Problems:   Myocardial infarction   Malignant neoplasm of upper-outer quadrant of female breast   Community acquired pneumonia   Time coordinating discharge: 35 minutes  Signed:  Murray Hodgkins, MD [facility] 01/27/2015, 12:02 PM

## 2015-01-27 NOTE — Progress Notes (Signed)
Patient 02 sat at with 02 at 2l is 99% and without 02 is 95%. Patient 02 sat ambulating without 02 is 89%.

## 2015-01-28 LAB — CULTURE, BLOOD (ROUTINE X 2)
CULTURE: NO GROWTH
CULTURE: NO GROWTH

## 2015-01-28 NOTE — Care Management Utilization Note (Signed)
UR completed 

## 2015-02-12 ENCOUNTER — Other Ambulatory Visit (HOSPITAL_COMMUNITY): Payer: Self-pay | Admitting: Cardiology

## 2015-02-12 DIAGNOSIS — R911 Solitary pulmonary nodule: Secondary | ICD-10-CM

## 2015-02-18 ENCOUNTER — Ambulatory Visit (HOSPITAL_COMMUNITY)
Admission: RE | Admit: 2015-02-18 | Discharge: 2015-02-18 | Disposition: A | Discharge status: Home / Self Care | Payer: Self-pay | Source: Ambulatory Visit | Attending: Cardiology | Admitting: Cardiology

## 2015-02-18 DIAGNOSIS — R0602 Shortness of breath: Secondary | ICD-10-CM | POA: Insufficient documentation

## 2015-02-18 DIAGNOSIS — R918 Other nonspecific abnormal finding of lung field: Secondary | ICD-10-CM | POA: Insufficient documentation

## 2015-02-18 DIAGNOSIS — Z87891 Personal history of nicotine dependence: Secondary | ICD-10-CM | POA: Insufficient documentation

## 2015-02-18 DIAGNOSIS — Z853 Personal history of malignant neoplasm of breast: Secondary | ICD-10-CM | POA: Insufficient documentation

## 2015-02-18 DIAGNOSIS — R911 Solitary pulmonary nodule: Secondary | ICD-10-CM

## 2015-02-18 MED ORDER — IOHEXOL 300 MG/ML  SOLN
80.0000 mL | Freq: Once | INTRAMUSCULAR | Status: AC | PRN
  Administered 2015-02-18: 80 mL via INTRAVENOUS

## 2015-02-19 ENCOUNTER — Telehealth: Payer: Self-pay | Admitting: Nurse Practitioner

## 2015-02-19 ENCOUNTER — Encounter (HOSPITAL_COMMUNITY): Payer: Self-pay

## 2015-02-19 ENCOUNTER — Emergency Department (HOSPITAL_COMMUNITY): Payer: Medicaid Other

## 2015-02-19 ENCOUNTER — Emergency Department (HOSPITAL_COMMUNITY)
Admission: EM | Admit: 2015-02-19 | Discharge: 2015-02-19 | Disposition: A | Discharge status: Home / Self Care | Payer: Medicaid Other | Attending: Emergency Medicine | Admitting: Emergency Medicine

## 2015-02-19 DIAGNOSIS — R222 Localized swelling, mass and lump, trunk: Secondary | ICD-10-CM | POA: Insufficient documentation

## 2015-02-19 DIAGNOSIS — J45909 Unspecified asthma, uncomplicated: Secondary | ICD-10-CM | POA: Insufficient documentation

## 2015-02-19 DIAGNOSIS — Z880 Allergy status to penicillin: Secondary | ICD-10-CM | POA: Insufficient documentation

## 2015-02-19 DIAGNOSIS — Z853 Personal history of malignant neoplasm of breast: Secondary | ICD-10-CM | POA: Insufficient documentation

## 2015-02-19 DIAGNOSIS — R1900 Intra-abdominal and pelvic swelling, mass and lump, unspecified site: Secondary | ICD-10-CM

## 2015-02-19 DIAGNOSIS — Z9861 Coronary angioplasty status: Secondary | ICD-10-CM | POA: Insufficient documentation

## 2015-02-19 DIAGNOSIS — Z7982 Long term (current) use of aspirin: Secondary | ICD-10-CM | POA: Insufficient documentation

## 2015-02-19 DIAGNOSIS — I10 Essential (primary) hypertension: Secondary | ICD-10-CM | POA: Insufficient documentation

## 2015-02-19 DIAGNOSIS — Z9889 Other specified postprocedural states: Secondary | ICD-10-CM | POA: Insufficient documentation

## 2015-02-19 DIAGNOSIS — I252 Old myocardial infarction: Secondary | ICD-10-CM | POA: Insufficient documentation

## 2015-02-19 DIAGNOSIS — Z9851 Tubal ligation status: Secondary | ICD-10-CM | POA: Insufficient documentation

## 2015-02-19 DIAGNOSIS — R188 Other ascites: Secondary | ICD-10-CM | POA: Insufficient documentation

## 2015-02-19 DIAGNOSIS — Z87891 Personal history of nicotine dependence: Secondary | ICD-10-CM | POA: Insufficient documentation

## 2015-02-19 DIAGNOSIS — Z79899 Other long term (current) drug therapy: Secondary | ICD-10-CM | POA: Insufficient documentation

## 2015-02-19 DIAGNOSIS — R1013 Epigastric pain: Secondary | ICD-10-CM | POA: Insufficient documentation

## 2015-02-19 LAB — CBC WITH DIFFERENTIAL/PLATELET
Basophils Absolute: 0 10*3/uL (ref 0.0–0.1)
Basophils Relative: 0 % (ref 0–1)
Eosinophils Absolute: 0 10*3/uL (ref 0.0–0.7)
Eosinophils Relative: 0 % (ref 0–5)
HCT: 39.4 % (ref 36.0–46.0)
Hemoglobin: 13.2 g/dL (ref 12.0–15.0)
Lymphocytes Relative: 15 % (ref 12–46)
Lymphs Abs: 1.5 10*3/uL (ref 0.7–4.0)
MCH: 31.2 pg (ref 26.0–34.0)
MCHC: 33.5 g/dL (ref 30.0–36.0)
MCV: 93.1 fL (ref 78.0–100.0)
Monocytes Absolute: 0.7 10*3/uL (ref 0.1–1.0)
Monocytes Relative: 7 % (ref 3–12)
Neutro Abs: 7.9 10*3/uL — ABNORMAL HIGH (ref 1.7–7.7)
Neutrophils Relative %: 78 % — ABNORMAL HIGH (ref 43–77)
Platelets: 383 10*3/uL (ref 150–400)
RBC: 4.23 MIL/uL (ref 3.87–5.11)
RDW: 14.9 % (ref 11.5–15.5)
WBC: 10 10*3/uL (ref 4.0–10.5)

## 2015-02-19 LAB — COMPREHENSIVE METABOLIC PANEL
ALT: 7 U/L (ref 0–35)
AST: 15 U/L (ref 0–37)
Albumin: 3.2 g/dL — ABNORMAL LOW (ref 3.5–5.2)
Alkaline Phosphatase: 67 U/L (ref 39–117)
Anion gap: 11 (ref 5–15)
BUN: 5 mg/dL — ABNORMAL LOW (ref 6–23)
CO2: 27 mmol/L (ref 19–32)
Calcium: 8.4 mg/dL (ref 8.4–10.5)
Chloride: 96 mmol/L (ref 96–112)
Creatinine, Ser: 0.64 mg/dL (ref 0.50–1.10)
GFR calc Af Amer: >90 mL/min (ref >90)
GFR calc non Af Amer: >90 mL/min (ref >90)
Glucose, Bld: 99 mg/dL (ref 70–99)
Potassium: 2.9 mmol/L — ABNORMAL LOW (ref 3.5–5.1)
Sodium: 134 mmol/L — ABNORMAL LOW (ref 135–145)
Total Bilirubin: 0.4 mg/dL (ref 0.3–1.2)
Total Protein: 6.9 g/dL (ref 6.0–8.3)

## 2015-02-19 LAB — PROTIME-INR
INR: 1.07 (ref 0.00–1.49)
Prothrombin Time: 14.1 seconds (ref 11.6–15.2)

## 2015-02-19 MED ORDER — POTASSIUM CHLORIDE ER 20 MEQ PO TBCR: 10.0000 meq | EXTENDED_RELEASE_TABLET | Freq: Two times a day (BID) | ORAL | Status: DC

## 2015-02-19 MED ORDER — FUROSEMIDE 20 MG PO TABS: 20.0000 mg | ORAL_TABLET | Freq: Every day | ORAL | Status: DC

## 2015-02-19 MED ORDER — OXYCODONE-ACETAMINOPHEN 5-325 MG PO TABS: 1.0000 | ORAL_TABLET | ORAL | Status: DC | PRN

## 2015-02-19 MED ORDER — POTASSIUM CHLORIDE CRYS ER 20 MEQ PO TBCR
60.0000 meq | EXTENDED_RELEASE_TABLET | Freq: Once | ORAL | Status: AC
  Administered 2015-02-19: 60 meq via ORAL
  Filled 2015-02-19: qty 3

## 2015-02-19 MED ORDER — IOHEXOL 300 MG/ML  SOLN: 100.0000 mL | Freq: Once | INTRAMUSCULAR | Status: DC | PRN

## 2015-02-19 NOTE — ED Notes (Signed)
Patient transported to CT 

## 2015-02-19 NOTE — ED Notes (Signed)
Had Chest CT yesterday as ordered by PCP for f/u of pna and an 'abnormal finding.' On CT noticed abdominal distention worsening, PCP referred pt to ED for abdominal distension.

## 2015-02-19 NOTE — Discharge Instructions (Signed)
Ascites °Ascites is a gathering of fluid in the belly (abdomen). This is most often caused by liver disease. It may also be caused by a number of other less common problems. It causes a ballooning out (distension) of the abdomen. °CAUSES  °Scarring of the liver (cirrhosis) is the most common cause of ascites. Other causes include: °· Infection or inflammation in the abdomen. °· Cancer in the abdomen. °· Heart failure. °· Certain forms of kidney failure (nephritic syndrome). °· Inflammation of the pancreas. °· Clots in the veins of the liver. °SYMPTOMS  °In the early stages of ascites, you may not have any symptoms. The main symptom of ascites is a sense of abdominal bloating. This is due to the presence of fluid. This may also cause an increase in abdominal or waist size. People with this condition can develop swelling in the legs, and men can develop a swollen scrotum. When there is a lot of fluid, it may be hard to breath. Stretching of the abdomen by fluid can be painful. °DIAGNOSIS  °Certain features of your medical history, such as a history of liver disease and of an enlarging abdomen, can suggest the presence of ascites. The diagnosis of ascites can be made on physical exam by your caregiver. An abdominal ultrasound examination can confirm that ascites is present, and estimate the amount of fluid. °Once ascites is confirmed, it is important to determine its cause. Again, a history of one of the conditions listed in "CAUSES" provides a strong clue. A physical exam is important, and blood and X-ray tests may be needed. During a procedure called paracentesis, a sample of fluid is removed from the abdomen. This can determine certain key features about the fluid, such as whether or not infection or cancer is present. Your caregiver will determine if a paracentesis is necessary. They will describe the procedure to you. °PREVENTION  °Ascites is a complication of other conditions. Therefore to prevent ascites, you  must seek treatment for any significant health conditions you have. Once ascites is present, careful attention to fluid and salt intake may help prevent it from getting worse. If you have ascites, you should not drink alcohol. °PROGNOSIS  °The prognosis of ascites depends on the underlying disease. If the disease is reversible, such as with certain infections or with heart failure, then ascites may improve or disappear. When ascites is caused by cirrhosis, then it indicates that the liver disease has worsened, and further evaluation and treatment of the liver disease is needed. If your ascites is caused by cancer, then the success or failure of the cancer treatment will determine whether your ascites will improve or worsen. °RISKS AND COMPLICATIONS  °Ascites is likely to worsen if it is not properly diagnosed and treated. A large amount of ascites can cause pain and difficulty breathing. The main complication, besides worsening, is infection (called spontaneous bacterial peritonitis). This requires prompt treatment. °TREATMENT  °The treatment of ascites depends on its cause. When liver disease is your cause, medical management using water pills (diuretics) and decreasing salt intake is often effective. Ascites due to peritoneal inflammation or malignancy (cancer) alone does not respond to salt restriction and diuretics. Hospitalization is sometimes required. °If the treatment of ascites cannot be managed with medications, a number of other treatments are available. Your caregivers will help you decide which will work best for you. Some of these are: °· Removal of fluid from the abdomen (paracentesis). °· Fluid from the abdomen is passed into a vein (peritoneovenous shunting). °·   Liver transplantation. °· Transjugular intrahepatic portosystemic stent shunt. °HOME CARE INSTRUCTIONS  °It is important to monitor body weight and the intake and output of fluids. Weigh yourself at the same time every day. Record your  weights. Fluid restriction may be necessary. It is also important to know your salt intake. The more salt you take in, the more fluid you will retain. Ninety percent of people with ascites respond to this approach. °· Follow any directions for medicines carefully. °· Follow up with your caregiver, as directed. °· Report any changes in your health, especially any new or worsening symptoms. °· If your ascites is from liver disease, avoid alcohol and other substances toxic to the liver. °SEEK MEDICAL CARE IF:  °· Your weight increases more than a few pounds in a few days. °· Your abdominal or waist size increases. °· You develop swelling in your legs. °· You had swelling and it worsens. °SEEK IMMEDIATE MEDICAL CARE IF:  °· You develop a fever. °· You develop new abdominal pain. °· You develop difficulty breathing. °· You develop confusion. °· You have bleeding from the mouth, stomach, or rectum. °MAKE SURE YOU:  °· Understand these instructions. °· Will watch your condition. °· Will get help right away if you are not doing well or get worse. °Document Released: 10/25/2005 Document Revised: 01/17/2012 Document Reviewed: 05/26/2007 °[employer]® Patient Information ©2015 [employer]. This information is not intended to replace advice given to you by your health care provider. Make sure you discuss any questions you have with your health care provider. ° °

## 2015-02-19 NOTE — ED Notes (Signed)
Unsuccessful IV attempt x 2, second RN to attempt

## 2015-02-19 NOTE — ED Notes (Signed)
Pt states she was here yesterday and had a CT done. Stated she called the doctors office this morning and she was told to come here for evaluation of her stomach swelling.

## 2015-02-19 NOTE — ED Provider Notes (Signed)
CSN: [id number]     Arrival date & time 02/19/15  1043 History  This chart was scribed for Karen Manifold, MD by Mercy Moore, ED scribe.  This patient was seen in room APA09/APA09 and the patient's care was started at 11:30 AM.   Chief Complaint  Patient presents with  . Bloated   The history is provided by the patient. No language interpreter was used.   HPI Comments: [name] is a 44 y.o. female with PMHx of Hypertension, MI, and breast cancer who presents to the Emergency Department complaining of worsening abdominal distention with onset approximately four days ago. Patient reports associated vomiting induced with eating, burning pain with emesis and shortness of breath with exertion. Patient denies history of similar symptoms. Patient denies liver disease of alcohol abuse; reports her last drink was a glass of wine 10 years ago. Patient with history of breast cancer treated with chemotherapy and partial mastectomy and stent placement after MI.   Past Medical History  Diagnosis Date  . Asthma 2005  . Hypertension 2012  . Personal history of tobacco use, presenting hazards to health   . Myocardial infarction 10/2012  . Cancer 2012    breast cancer, right-treated with neoadjunvant chemotherapy followed by partial mastectomy with sn bx and radiation. Pt is currently taking Tamoxifen since January 2013   . Lump or mass in breast 2012    right  . Malignant neoplasm of upper-outer quadrant of female breast 2012    right   Past Surgical History  Procedure Laterality Date  . Portacath placement    . Cesarean section  [id number]  . Tubal ligation  2004  . Breast lumpectomy Right 2012  . Finger surgery Left 1986    reconstruction, left pinky  . Wrist surgery Left 1987  . Coronary angioplasty with stent placement  20123    Myocardial infarction  . Left heart catheterization with coronary angiogram N/A 10/16/2012    Procedure: LEFT HEART CATHETERIZATION WITH CORONARY ANGIOGRAM;   Surgeon: Clent Demark, MD;  Location: [facility] CATH LAB;  Service: Cardiovascular;  Laterality: N/A;   Family History  Problem Relation Age of Onset  . Cancer Maternal Grandmother 70    breast   History  Substance Use Topics  . Smoking status: Former Smoker -- 1.00 packs/day for 15 years    Types: Cigarettes  . Smokeless tobacco: Not on file  . Alcohol Use: No   OB History    Gravida Para Term Preterm AB TAB SAB Ectopic Multiple Living   2         2      Obstetric Comments   Age with first menstruation-11 Age with first pregnancy-18 LMP-2012     Review of Systems  Constitutional: Negative for fever.  HENT: Negative for congestion and sore throat.   Eyes: Negative for pain.  Respiratory: Negative for choking, shortness of breath and wheezing.   Cardiovascular: Negative for chest pain.  Gastrointestinal: Negative for vomiting, abdominal pain and diarrhea.  Genitourinary: Negative for dysuria.  Musculoskeletal: Negative for neck pain.  Skin: Negative for rash.  Allergic/Immunologic: Negative for immunocompromised state.  Neurological: Negative for headaches.  Hematological: Negative for adenopathy.  Psychiatric/Behavioral: Negative for behavioral problems.      Allergies  Codeine and Penicillins  Home Medications   Prior to Admission medications   Medication Sig Start Date End Date Taking? Authorizing Provider  albuterol (PROVENTIL HFA;VENTOLIN HFA) 108 (90 BASE) MCG/ACT inhaler Inhale 2 puffs into the lungs every  6 (six) hours as needed for wheezing or shortness of breath. 01/27/15  Yes Samuella Cota, MD  aspirin EC 81 MG EC tablet Take 1 tablet (81 mg total) by mouth daily. 10/19/12  Yes Charolette Forward, MD  bismuth subsalicylate (PEPTO BISMOL) 262 MG/15ML suspension Take 30 mLs by mouth every 6 (six) hours as needed for indigestion.   Yes Historical Provider, MD  lisinopril (PRINIVIL,ZESTRIL) 5 MG tablet Take 1 tablet (5 mg total) by mouth daily. Patient taking  differently: Take 2.5 mg by mouth daily.  01/27/15  Yes Samuella Cota, MD  metoprolol tartrate (LOPRESSOR) 25 MG tablet Take 0.5 tablets (12.5 mg total) by mouth 2 (two) times daily. 01/27/15  Yes Samuella Cota, MD  nitroGLYCERIN (NITROSTAT) 0.4 MG SL tablet Place 1 tablet (0.4 mg total) under the tongue every 5 (five) minutes x 3 doses as needed for chest pain. 01/27/15  Yes Samuella Cota, MD  cefUROXime (CEFTIN) 500 MG tablet Take 1 tablet (500 mg total) by mouth 2 (two) times daily with a meal. Patient not taking: Reported on 02/19/2015 01/27/15   Samuella Cota, MD   Triage Vitals: BP 155/94 mmHg  Pulse 108  Temp(Src) 97.8 F (36.6 C) (Oral)  Resp 20  Ht 5\' 2"  (1.575 m)  Wt 125 lb (56.7 kg)  BMI 22.86 kg/m2  SpO2 90% Physical Exam  Constitutional: She is oriented to person, place, and time. She appears well-developed and well-nourished. No distress.  HENT:  Head: Normocephalic and atraumatic.  Eyes: EOM are normal.  Neck: Neck supple. No tracheal deviation present.  Cardiovascular: Normal rate.   Pulmonary/Chest: Effort normal. No respiratory distress.  Abdominal: Soft. She exhibits distension. There is tenderness.  Positive fluid wave. Minimal tenderness in epigastrium.   Musculoskeletal: Normal range of motion.  Neurological: She is alert and oriented to person, place, and time.  Skin: Skin is warm and dry.  Psychiatric: She has a normal mood and affect. Her behavior is normal.  Nursing note and vitals reviewed.   ED Course  Procedures (including critical care time)  COORDINATION OF CARE: 11:38 AM- Concerns for possible malignancy; will order CT of abdomen and possibly paracentesis if no clear etiology. Discussed treatment plan with patient at bedside and patient agreed to plan.   Labs Review Labs Reviewed - No data to display  Imaging Review Ct Chest W Contrast  02/18/2015   CLINICAL DATA:  Shortness of breath worsening over the last several days. Pneumonia  last month. History of right breast cancer. Right lung nodule.  EXAM: CT CHEST WITH CONTRAST  TECHNIQUE: Multidetector CT imaging of the chest was performed during intravenous contrast administration.  CONTRAST:  39mL OMNIPAQUE IOHEXOL 300 MG/ML  SOLN  COMPARISON:  01/23/2015  FINDINGS: Mediastinum/Nodes: Stable left anterior descending coronary artery stent. Small mediastinal lymph nodes are not pathologically enlarged by size criteria. Stable trace pericardial fluid.  Lungs/Pleura: Centrilobular emphysema. The diffuse tree-in-bud reticulonodular opacities shown previously of primarily resolved. There is some mild scarring along the minor fissure on image 26 of series 3. At the site of the previous 1.9 by 1.3 cm nodular density inferiorly in the right middle lobe, we currently demonstrated band of irregular density measuring 7 mm in thickness and 1.9 cm from the in length, image 48 series 3. There is some minimal scarring at a site of consolidation in the posterior basal segment right lower lobe.  Improved aeration in the lower lingula posteriorly, with only mild scarring in this vicinity. Continued  triangular scarring or atelectasis laterally in the left lower lobe, image 49 series 3. The The the mild scarring medially in the left lower lobe on image 50 of series 3.  There is a minimal residual reticulonodular interstitial opacity in the left lower lobe and to a lesser extent in the lingula, but greatly improved over previous.  Mild diffuse airway thickening.  Upper abdomen: Prominent ascites, greatly increased from prior exam.  Musculoskeletal: Unremarkable  IMPRESSION: 1. Near complete resolution of the prior atypical infectious bronchiolitis. 2. At the site of the nodularity in the right middle lobe, there is now a band of density. This is improved and in general this improvement is considered a sign that this is most likely inflammatory. However, it has not completely resolved. Given the bandlike  configuration, this is most likely from scarring or atelectasis. Consider a 6 month followup CT to ensure complete clearance. 3. Prominent ascites, greatly increased from the prior exam, etiology unknown. 4. Airway thickening is present, suggesting bronchitis or reactive airways disease. 5. Centrilobular emphysema.   Electronically Signed   By: Van Clines M.D.   On: 02/18/2015 15:16     EKG Interpretation None      MDM   Final diagnoses:  Ascites  Pelvic mass    43yF with worsening abdominal distension. Ascites noted on recent chest imaging. Unfortunately CT with pelvic masses and likely malignant ascites. Abdomen is soft. No respiratory distress. Close gyn-onc FU.   I personally preformed the services scribed in my presence. The recorded information has been reviewed is accurate. Karen Manifold, MD.    Karen Manifold, MD 02/21/15 [phone]

## 2015-02-19 NOTE — Telephone Encounter (Signed)
Patient states she is calling to make a new pt appointment with our doctor. Per patient, she was seen at [facility] ED today; CT performed showing "a mass on both of my ovaries." CT reviewed by Joylene John, NP, and new patient apt given for 02/20/15 at 2 pm. Patient instructed to arrive at 1:30; she voices understanding of apt date/time.

## 2015-02-20 ENCOUNTER — Ambulatory Visit: Payer: MEDICAID | Attending: Gynecologic Oncology | Admitting: Gynecologic Oncology

## 2015-02-20 ENCOUNTER — Encounter: Payer: Self-pay | Admitting: Gynecologic Oncology

## 2015-02-20 ENCOUNTER — Other Ambulatory Visit (HOSPITAL_BASED_OUTPATIENT_CLINIC_OR_DEPARTMENT_OTHER): Payer: Self-pay

## 2015-02-20 VITALS — BP 142/97 | HR 105 | Temp 98.2°F | Resp 22 | Ht 62.0 in | Wt 132.7 lb

## 2015-02-20 DIAGNOSIS — N8320 Unspecified ovarian cysts: Secondary | ICD-10-CM

## 2015-02-20 DIAGNOSIS — N83202 Unspecified ovarian cyst, left side: Secondary | ICD-10-CM

## 2015-02-20 DIAGNOSIS — E46 Unspecified protein-calorie malnutrition: Secondary | ICD-10-CM

## 2015-02-20 DIAGNOSIS — R188 Other ascites: Secondary | ICD-10-CM

## 2015-02-20 DIAGNOSIS — I252 Old myocardial infarction: Secondary | ICD-10-CM

## 2015-02-20 DIAGNOSIS — N83201 Unspecified ovarian cyst, right side: Secondary | ICD-10-CM

## 2015-02-20 DIAGNOSIS — Z720 Tobacco use: Secondary | ICD-10-CM

## 2015-02-20 DIAGNOSIS — Z8679 Personal history of other diseases of the circulatory system: Secondary | ICD-10-CM

## 2015-02-20 DIAGNOSIS — Z7982 Long term (current) use of aspirin: Secondary | ICD-10-CM

## 2015-02-20 NOTE — Progress Notes (Signed)
Consult Note: Gyn-Onc  Consult was requested by [name] for the evaluation of [name] with ovarian masses and ascites.  CC:  Chief Complaint  Patient presents with  . bilateral ovarian cysts    Assessment/Plan:  [name]  is a 44 y.o.  year old with what is clinically suspicious for stage III ovarian cancer with bilateral adnexal masses, large volume ascites, and peritoneal thickening.  I performed a history, physical examination, and personally reviewed the patient's imaging films including the CT of the abdomen and pelvis from 02/19/15.  I discussed with [name] that ovarian cancers typically treated with a combination of chemotherapy and surgery. I believe that she is a good candidate for upfront cytoreductive surgery given that the CT scan does not show any evidence for unresectable disease, and she has a relatively young age and good performance status. Additionally she is extremely symptomatic with ascites and this may be somewhat improved in the short-term with debulking. I explained that following surgical debulking she will require at least 6 cycles of adjuvant chemotherapy and we will facilitate this referral for her postoperatively. I discussed that surgery will likely involve an exploratory laparotomy with total abdominal hysterectomy BSO, omentectomy, and intraperitoneal debulking. I discussed that small or large bowel resection is common in ovarian debulking surgeries. I discussed that I do not see any obvious rectal involvement on imaging, or on physical examination, however a rectal resection is necessary in approximately 30% of cases of upfront debulking and a small percentage of those patients a colostomy is created.   I discussed that this is major abdominal surgery and carries with it substantial perioperative risk including risks for infection, wound dehiscence, significant blood loss, damage to internal structures such as the urinary tract or  gastrointestinal system, readmission, reoperation, DVT and PE, and potentially death. She is a young woman with relatively good health and performance status however she has some underlying medical comorbidities that put her at increased risk for these above-stated surgical risks including her tobacco use, a history of a myocardial infarction and coronary artery disease, aspirin use, and her underlying poor nutritional status (which is likely secondary to her ovarian cancer). Many of these are not modifiable risk factors, however I counseled her about the importance of smoking cessation preoperatively, and the importance of optimizing protein intake in preoperative nutrition. I counseled her about taking supplemental protein drinks and protein intake with each meal. With respect to her coronary artery disease we will reach out to her cardiologist to ensure she is optimize for surgery. She had a history of, what I believe, was a non-drug-eluting stent placed in 2013. She is on 81 mg of aspirin but no other antiplatelet agents or anticoagulants. I believe it is safe for her to continue her aspirin up through her surgical event, however I did counsel her that this increases her risk for bleeding complication.  Today we have ordered a CA-125 to serve as a pretreatment tumor marker assessment. I've also ordered a paracentesis with cytology of her ascites.  To expedite her surgery and initiation of treatment of this serious condition I'm recommending she undergo surgery with my partner Dr. Cindie Laroche in [place] because we can facilitate an earlier surgical date at that site.  HPI: [name] is a 78 year gravida 2 para 2 who is seen in consultation at the request of [name] for ascites and bilateral adnexal masses. The patient reports a one-month history of increasing abdominal  girth, emesis with eating and early satiety, shortness of breath and inability lie flat. She initially underwent a chest CT scan in  March 2016 to evaluate her shortness of breath. This showed a right middle lobe 1.9 cm irregular nodule that was felt to be consistent with pneumonia. She was admitted to the hospital and was treated for pneumonia as an inpatient for approximately 1 week. Her shortness of breath symptoms persisted after discharge and she was noting increasing abdominal girth and discomfort and therefore she went to the emergency room on 02/19/2015. At this time CT scan of the chest abdomen and pelvis was ordered. The chest CT showed almost complete resolution of the right lobe mass however there was some increased stranding still present and therefore they recommended follow-up CT in 6 months. However her CT of the abdomen and pelvis was very remarkable for large volume ascites, an enlarged uterus with a fibroid measuring 9 x 10 cm the increase in size recently and bilateral ovarian masses measuring 4.2 x 4.4  is on the right and 4.5 x 4.2 seconds on the left. There was adjacent 5.7 x 5 cm left ovarian cyst also present. There was no pelvic or rectoperineal lymphadenopathy appreciated there was however mild peritoneal thickening inferiorly along the left paracolic gutter. There was no obvious omental caking present.   Of note [name] has a personal history of stage II breast cancer in 2013 treated with chemotherapy, radiation, and lumpectomy with lymph node dissection. This was treated in [place]. It was a right-sided lesion. She has been menopausal since completing chemotherapy and has had no vaginal bleeding  She also has a history of coronary artery disease and experienced a myocardial infarction in 2013 which was involving the left anterior descending artery and required stenting. Dr. Rosalva Ferron (ph [phone]) is her cardiologist who saw her "a couple of weeks ago" and stated that all was well and she could follow up in 3 months time. She's not been experiencing recent symptoms of coronary disease including no chest pains  on exertion. She does have increasing shortness of breath but this is felt to be secondary to ascites.  Interval History: progressive dyspnoea.  Current Meds:  Outpatient Encounter Prescriptions as of 02/20/2015  Medication Sig  . albuterol (PROVENTIL HFA;VENTOLIN HFA) 108 (90 BASE) MCG/ACT inhaler Inhale 2 puffs into the lungs every 6 (six) hours as needed for wheezing or shortness of breath.  Marland Kitchen aspirin EC 81 MG EC tablet Take 1 tablet (81 mg total) by mouth daily.  Marland Kitchen bismuth subsalicylate (PEPTO BISMOL) 262 MG/15ML suspension Take 30 mLs by mouth every 6 (six) hours as needed for indigestion.  Marland Kitchen lisinopril (PRINIVIL,ZESTRIL) 5 MG tablet Take 1 tablet (5 mg total) by mouth daily. (Patient taking differently: Take 2.5 mg by mouth daily. )  . metoprolol tartrate (LOPRESSOR) 25 MG tablet Take 0.5 tablets (12.5 mg total) by mouth 2 (two) times daily.  Marland Kitchen oxyCODONE-acetaminophen (PERCOCET/ROXICET) 5-325 MG per tablet Take 1-2 tablets by mouth every 4 (four) hours as needed.  . potassium chloride 20 MEQ TBCR Take 10 mEq by mouth 2 (two) times daily.  . furosemide (LASIX) 20 MG tablet Take 1 tablet (20 mg total) by mouth daily. (Patient not taking: Reported on 02/20/2015)  . nitroGLYCERIN (NITROSTAT) 0.4 MG SL tablet Place 1 tablet (0.4 mg total) under the tongue every 5 (five) minutes x 3 doses as needed for chest pain. (Patient not taking: Reported on 02/20/2015)  . [DISCONTINUED] cefUROXime (CEFTIN) 500 MG tablet  Take 1 tablet (500 mg total) by mouth 2 (two) times daily with a meal. (Patient not taking: Reported on 02/19/2015)    Allergy:  Allergies  Allergen Reactions  . Codeine Nausea And Vomiting  . Penicillins Rash    Social Hx:   History   Social History  . Marital Status: Married    Spouse Name: N/A  . Number of Children: N/A  . Years of Education: N/A   Occupational History  . Not on file.   Social History Main Topics  . Smoking status: Former Smoker -- 1.00 packs/day for 15  years    Types: Cigarettes  . Smokeless tobacco: Not on file  . Alcohol Use: No  . Drug Use: No  . Sexual Activity: Not on file   Other Topics Concern  . Not on file   Social History Narrative    Past Surgical Hx:  Past Surgical History  Procedure Laterality Date  . Portacath placement    . Cesarean section  [id number]  . Tubal ligation  2004  . Breast lumpectomy Right 2012  . Finger surgery Left 1986    reconstruction, left pinky  . Wrist surgery Left 1987  . Coronary angioplasty with stent placement  20123    Myocardial infarction  . Left heart catheterization with coronary angiogram N/A 10/16/2012    Procedure: LEFT HEART CATHETERIZATION WITH CORONARY ANGIOGRAM;  Surgeon: Clent Demark, MD;  Location: [facility] CATH LAB;  Service: Cardiovascular;  Laterality: N/A;    Past Medical Hx:  Past Medical History  Diagnosis Date  . Asthma 2005  . Hypertension 2012  . Personal history of tobacco use, presenting hazards to health   . Myocardial infarction 10/2012  . Cancer 2012    breast cancer, right-treated with neoadjunvant chemotherapy followed by partial mastectomy with sn bx and radiation. Pt is currently taking Tamoxifen since January 2013   . Lump or mass in breast 2012    right  . Malignant neoplasm of upper-outer quadrant of Blair breast 2012    right    Past Gynecological History:  Cesarean x 2 No LMP recorded. Patient is postmenopausal.  Family Hx:  Family History  Problem Relation Age of Onset  . Cancer Maternal Grandmother 52    breast    Review of Systems:  Constitutional  Feels weak and fatigued  ENT Normal appearing ears and nares bilaterally Skin/Breast  No rash, sores, jaundice, itching, dryness Cardiovascular  No chest pain, + shortness of breath, no edema  Pulmonary  No cough or wheeze.  Gastro Intestinal  No nausea, vomitting, or diarrhoea. No bright red blood per rectum, no abdominal pain, change in bowel movement, or constipation.  Genito  Urinary  No frequency, urgency, dysuria, see HPI. No postmenopausal bleeding. Musculo Skeletal  No myalgia, arthralgia, joint swelling or pain  Neurologic  No weakness, numbness, change in gait,  Psychology  No depression, anxiety, insomnia.   Vitals:  Blood pressure 142/97, pulse 105, temperature 98.2 F (36.8 C), temperature source Oral, resp. rate 22, height 5\' 2"  (1.575 m), weight 132 lb 11.2 oz (60.192 kg).  Physical Exam: WD in NAD Neck  Supple NROM, without any enlargements.  Lymph Node Survey No cervical supraclavicular or inguinal adenopathy Cardiovascular  Pulse normal rate, regularity and rhythm. S1 and S2 normal.  Lungs  Clear to auscultation bilateraly, without wheezes/crackles/rhonchi. Good air movement.  Skin  No rash/lesions/breakdown  Psychiatry  Alert and oriented to person, place, and time  Abdomen  Normoactive bowel sounds,  abdomen firm, distended, dull to percuss, non-tender and thinwithout evidence of hernia. No palpable masses Back No CVA tenderness Genito Urinary  Vulva/vagina: Normal external Blair genitalia.  No lesions. No discharge or bleeding.  Bladder/urethra:  No lesions or masses, well supported bladder  Vagina: smooth, no lesions  Cervix: flush with vagina  Uterus: Large, smooth, bulges both posterior and anteriorally.  Adnexa: large smooth cystic masses in pelvis. Mobile. Rectal  Good tone, no masses no cul de sac nodularity.  Extremities  No bilateral cyanosis, clubbing or edema.   Donaciano Eva, MD   02/20/2015, 4:37 PM

## 2015-02-20 NOTE — Patient Instructions (Signed)
You are scheduled for a preop appointment on 02/24/15 at 10:30am at [facility].  Plan for surgery 02/27/15 with Dr. Clarene Essex at [facility]. They will let you know the details and time to arrive for surgery at your pre-op appointment.   You are also scheduled for a paracentesis tomorrow, 02/21/15, at [facility] at 2:30pm. Go through the main entrance and go directly to the radiology department. If you get more short of breath or develop any additional symptoms overnight, please call 911 or go to the nearest emergency room.

## 2015-02-21 ENCOUNTER — Ambulatory Visit (HOSPITAL_COMMUNITY)
Admission: RE | Admit: 2015-02-21 | Discharge: 2015-02-21 | Disposition: A | Discharge status: Home / Self Care | Payer: MEDICAID | Source: Ambulatory Visit | Attending: Gynecologic Oncology | Admitting: Gynecologic Oncology

## 2015-02-21 ENCOUNTER — Encounter (HOSPITAL_COMMUNITY): Payer: Self-pay

## 2015-02-21 DIAGNOSIS — N83201 Unspecified ovarian cyst, right side: Secondary | ICD-10-CM

## 2015-02-21 DIAGNOSIS — N8320 Unspecified ovarian cysts: Secondary | ICD-10-CM | POA: Insufficient documentation

## 2015-02-21 DIAGNOSIS — R188 Other ascites: Secondary | ICD-10-CM | POA: Insufficient documentation

## 2015-02-21 DIAGNOSIS — N83202 Unspecified ovarian cyst, left side: Secondary | ICD-10-CM

## 2015-02-21 LAB — CA 125: CA 125: 651 U/mL — ABNORMAL HIGH (ref <35)

## 2015-02-21 NOTE — Progress Notes (Signed)
Paracentesis complete no signs of distress. 3600 ml yellow colored ascites removed.

## 2015-02-21 NOTE — Procedures (Signed)
PreOperative Dx: BILATERAL ovarian masses, ascites Postoperative Dx: BILATERAL ovarian masses, ascites Procedure:   US guided paracentesis Radiologist:  Thornton Papas Anesthesia:  10 ml of 1% lidocaine Specimen:  3600 ml of yellow ascitic fluid EBL:   < 1 ml Complications: None

## 2015-02-26 DIAGNOSIS — R180 Malignant ascites: Secondary | ICD-10-CM | POA: Insufficient documentation

## 2015-03-02 ENCOUNTER — Encounter (HOSPITAL_COMMUNITY): Payer: Self-pay | Admitting: Physician Assistant

## 2015-03-04 ENCOUNTER — Telehealth: Payer: Self-pay | Admitting: Gynecologic Oncology

## 2015-03-04 NOTE — Telephone Encounter (Signed)
Patient called with concerns about whether she would need chemo, pathology from her paracentesis, etc.  She reports having a "strand of lymph nodes under her left arm that are swollen about the size of small grapes."  She states "it was not like that in the hospital but several IVs were in that arm."  She states she has multiple bruises up her left arm and it not able to tell whether there is erythema around the previous IV sites.  She states her left arm swelling and right foot swelling was present during her admission at [facility].  Unsure whether she had an IV infiltrate.  Advised that the following issues would be discussed with Dr. Denman George.  Surgical pathology from [facility] still in process and patient informed her case may be discussed at the multidisciplinary clinic at [facility] this afternoon to identify recommendations for further treatment, etc.  She will be contacted with further recommendations.  Advised to call for any concerns or questions in between that time.

## 2015-03-06 DIAGNOSIS — C50919 Malignant neoplasm of unspecified site of unspecified female breast: Secondary | ICD-10-CM | POA: Insufficient documentation

## 2015-03-07 ENCOUNTER — Telehealth: Payer: Self-pay | Admitting: Gynecologic Oncology

## 2015-03-07 NOTE — Telephone Encounter (Signed)
Called to follow up with patient.  Patient reporting improvement in swelling.  Lymph nodes under left arm still slightly palpable but swelling in LUE and right foot has improved per pt.  She has spoken with [facility] about moderate nausea and is being managed by [facility] for this.  She is also advised to follow up with her medical oncologist, Dr. Kennith Gain at [facility] to call for any questions or concerns.

## 2015-03-09 ENCOUNTER — Emergency Department (HOSPITAL_COMMUNITY)
Admission: EM | Admit: 2015-03-09 | Discharge: 2015-03-10 | Disposition: A | Discharge status: Home / Self Care | Payer: Medicaid Other | Attending: Emergency Medicine | Admitting: Emergency Medicine

## 2015-03-09 ENCOUNTER — Encounter (HOSPITAL_COMMUNITY): Payer: Self-pay | Admitting: *Deleted

## 2015-03-09 DIAGNOSIS — J45909 Unspecified asthma, uncomplicated: Secondary | ICD-10-CM | POA: Diagnosis not present

## 2015-03-09 DIAGNOSIS — E876 Hypokalemia: Secondary | ICD-10-CM | POA: Diagnosis not present

## 2015-03-09 DIAGNOSIS — Z7982 Long term (current) use of aspirin: Secondary | ICD-10-CM | POA: Insufficient documentation

## 2015-03-09 DIAGNOSIS — Z9889 Other specified postprocedural states: Secondary | ICD-10-CM | POA: Insufficient documentation

## 2015-03-09 DIAGNOSIS — C569 Malignant neoplasm of unspecified ovary: Secondary | ICD-10-CM | POA: Diagnosis not present

## 2015-03-09 DIAGNOSIS — Z87891 Personal history of nicotine dependence: Secondary | ICD-10-CM | POA: Diagnosis not present

## 2015-03-09 DIAGNOSIS — R180 Malignant ascites: Secondary | ICD-10-CM | POA: Insufficient documentation

## 2015-03-09 DIAGNOSIS — I252 Old myocardial infarction: Secondary | ICD-10-CM | POA: Insufficient documentation

## 2015-03-09 DIAGNOSIS — Y836 Removal of other organ (partial) (total) as the cause of abnormal reaction of the patient, or of later complication, without mention of misadventure at the time of the procedure: Secondary | ICD-10-CM | POA: Insufficient documentation

## 2015-03-09 DIAGNOSIS — I10 Essential (primary) hypertension: Secondary | ICD-10-CM | POA: Diagnosis not present

## 2015-03-09 DIAGNOSIS — Z9851 Tubal ligation status: Secondary | ICD-10-CM | POA: Insufficient documentation

## 2015-03-09 DIAGNOSIS — Z79899 Other long term (current) drug therapy: Secondary | ICD-10-CM | POA: Diagnosis not present

## 2015-03-09 DIAGNOSIS — Z853 Personal history of malignant neoplasm of breast: Secondary | ICD-10-CM | POA: Insufficient documentation

## 2015-03-09 DIAGNOSIS — Z880 Allergy status to penicillin: Secondary | ICD-10-CM | POA: Diagnosis not present

## 2015-03-09 DIAGNOSIS — T8131XA Disruption of external operation (surgical) wound, not elsewhere classified, initial encounter: Secondary | ICD-10-CM | POA: Diagnosis not present

## 2015-03-09 DIAGNOSIS — Z9861 Coronary angioplasty status: Secondary | ICD-10-CM | POA: Diagnosis not present

## 2015-03-09 DIAGNOSIS — Z4801 Encounter for change or removal of surgical wound dressing: Secondary | ICD-10-CM | POA: Diagnosis present

## 2015-03-09 NOTE — ED Notes (Signed)
Pt c/o clear drainage from hysterectomy site; pt states she had surgery on the 22 of April

## 2015-03-09 NOTE — ED Provider Notes (Signed)
CSN: [id number]     Arrival date & time 03/09/15  2302 History   First MD Initiated Contact with Patient 03/09/15 2351     Chief Complaint  Patient presents with  . Wound Check     (Consider location/radiation/quality/duration/timing/severity/associated sxs/prior Treatment) Patient is a 44 y.o. female presenting with wound check. The history is provided by the patient.  Wound Check  She had total, all hysterectomy with bilateral sublingual oophorectomy on April 22. Wound was closed with staples and was generally doing well. Tonight, she had a lot of fluid come out through the incision just inferior to the umbilicus and the wound seems to be separating at that point. There was some soreness when that happened. I'm told that there was a large amount of fluid that soaked Posey Pronto. Fluid is no longer draining and pain has resolved. She denies fever or chills or sweats. She did not have abdominal pain prior to the fluid coming out.  Past Medical History  Diagnosis Date  . Asthma 2005  . Hypertension 2012  . Personal history of tobacco use, presenting hazards to health   . Myocardial infarction 10/2012  . Cancer 2012    breast cancer, right-treated with neoadjunvant chemotherapy followed by partial mastectomy with sn bx and radiation. Pt is currently taking Tamoxifen since January 2013   . Lump or mass in breast 2012    right  . Malignant neoplasm of upper-outer quadrant of female breast 2012    right   Past Surgical History  Procedure Laterality Date  . Portacath placement    . Cesarean section  [id number]  . Tubal ligation  2004  . Breast lumpectomy Right 2012  . Finger surgery Left 1986    reconstruction, left pinky  . Wrist surgery Left 1987  . Coronary angioplasty with stent placement  20123    Myocardial infarction  . Left heart catheterization with coronary angiogram N/A 10/16/2012    Procedure: LEFT HEART CATHETERIZATION WITH CORONARY ANGIOGRAM;  Surgeon: Clent Demark, MD;   Location: [facility] CATH LAB;  Service: Cardiovascular;  Laterality: N/A;   Family History  Problem Relation Age of Onset  . Cancer Maternal Grandmother 16    breast   History  Substance Use Topics  . Smoking status: Former Smoker -- 1.00 packs/day for 15 years    Types: Cigarettes  . Smokeless tobacco: Not on file  . Alcohol Use: No   OB History    Gravida Para Term Preterm AB TAB SAB Ectopic Multiple Living   2         2      Obstetric Comments   Age with first menstruation-11 Age with first pregnancy-18 LMP-2012     Review of Systems  All other systems reviewed and are negative.     Allergies  Codeine and Penicillins  Home Medications   Prior to Admission medications   Medication Sig Start Date End Date Taking? Authorizing Provider  docusate sodium (COLACE) 100 MG capsule Take 100 mg by mouth 2 (two) times daily.   Yes Historical Provider, MD  enoxaparin (LOVENOX) 40 MG/0.4ML injection Inject 40 mg into the skin daily.   Yes Historical Provider, MD  ibuprofen (ADVIL,MOTRIN) 600 MG tablet Take 600 mg by mouth every 6 (six) hours as needed.   Yes Historical Provider, MD  oxyCODONE (OXY IR/ROXICODONE) 5 MG immediate release tablet Take 5 mg by mouth every 4 (four) hours as needed for severe pain.   Yes Historical Provider, MD  albuterol (PROVENTIL  HFA;VENTOLIN HFA) 108 (90 BASE) MCG/ACT inhaler Inhale 2 puffs into the lungs every 6 (six) hours as needed for wheezing or shortness of breath. 01/27/15   Samuella Cota, MD  aspirin EC 81 MG EC tablet Take 1 tablet (81 mg total) by mouth daily. 10/19/12   Charolette Forward, MD  bismuth subsalicylate (PEPTO BISMOL) 262 MG/15ML suspension Take 30 mLs by mouth every 6 (six) hours as needed for indigestion.    Historical Provider, MD  furosemide (LASIX) 20 MG tablet Take 1 tablet (20 mg total) by mouth daily. Patient not taking: Reported on 02/20/2015 02/19/15   [name], MD  lisinopril (PRINIVIL,ZESTRIL) 5 MG tablet Take 1 tablet (5  mg total) by mouth daily. Patient taking differently: Take 2.5 mg by mouth daily.  01/27/15   Samuella Cota, MD  metoprolol tartrate (LOPRESSOR) 25 MG tablet Take 0.5 tablets (12.5 mg total) by mouth 2 (two) times daily. 01/27/15   Samuella Cota, MD  nitroGLYCERIN (NITROSTAT) 0.4 MG SL tablet Place 1 tablet (0.4 mg total) under the tongue every 5 (five) minutes x 3 doses as needed for chest pain. Patient not taking: Reported on 02/20/2015 01/27/15   Samuella Cota, MD  oxyCODONE-acetaminophen (PERCOCET/ROXICET) 5-325 MG per tablet Take 1-2 tablets by mouth every 4 (four) hours as needed. 02/19/15   [name], MD  potassium chloride 20 MEQ TBCR Take 10 mEq by mouth 2 (two) times daily. 02/19/15   [name], MD   BP 115/87 mmHg  Pulse 107  Temp(Src) 98.2 F (36.8 C) (Oral)  Resp 16  Ht 5\' 2"  (1.575 m)  Wt 130 lb (58.968 kg)  BMI 23.77 kg/m2  SpO2 95% Physical Exam  Nursing note and vitals reviewed.  44 year old female, resting comfortably and in no acute distress. Vital signs are significant for tachycardia. Oxygen saturation is 95%, which is normal. Head is normocephalic and atraumatic. PERRLA, EOMI. Oropharynx is clear. Neck is nontender and supple without adenopathy or JVD. Back is nontender and there is no CVA tenderness. Lungs are clear without rales, wheezes, or rhonchi. Chest is nontender. Heart has regular rate and rhythm without murmur. Abdomen is soft, flat, nontender without masses or hepatosplenomegaly and peristalsis is normoactive. Midline incision is present with staples in place. There is a small area of dehiscence inferior to the umbilicus without any active draining. This area is probed with a cotton tip applicator and this small amount of serous fluid is absorbed onto the contact applicator but there is no obvious tract. Extremities have no cyanosis or edema, full range of motion is present. Skin is warm and dry without rash. Neurologic: Mental status is  normal, cranial nerves are intact, there are no motor or sensory deficits.  ED Course  Procedures (including critical care time) Labs Review Results for orders placed or performed during the hospital encounter of 03/09/15  Comprehensive metabolic panel  Result Value Ref Range   Sodium 134 (L) 135 - 145 mmol/L   Potassium 2.8 (L) 3.5 - 5.1 mmol/L   Chloride 99 (L) 101 - 111 mmol/L   CO2 27 22 - 32 mmol/L   Glucose, Bld 116 (H) 70 - 99 mg/dL   BUN 8 6 - 20 mg/dL   Creatinine, Ser 0.49 0.44 - 1.00 mg/dL   Calcium 7.6 (L) 8.9 - 10.3 mg/dL   Total Protein 5.6 (L) 6.5 - 8.1 g/dL   Albumin 2.1 (L) 3.5 - 5.0 g/dL   AST 24 15 - 41 U/L  ALT 17 14 - 54 U/L   Alkaline Phosphatase 79 38 - 126 U/L   Total Bilirubin 0.5 0.3 - 1.2 mg/dL   GFR calc non Af Amer >60 >60 mL/min   GFR calc Af Amer >60 >60 mL/min   Anion gap 8 5 - 15  CBC with Differential  Result Value Ref Range   WBC 13.6 (H) 4.0 - 10.5 K/uL   RBC 3.24 (L) 3.87 - 5.11 MIL/uL   Hemoglobin 10.1 (L) 12.0 - 15.0 g/dL   HCT 29.7 (L) 36.0 - 46.0 %   MCV 91.7 78.0 - 100.0 fL   MCH 31.2 26.0 - 34.0 pg   MCHC 34.0 30.0 - 36.0 g/dL   RDW 15.1 11.5 - 15.5 %   Platelets 670 (H) 150 - 400 K/uL   Neutrophils Relative % 83 (H) 43 - 77 %   Neutro Abs 11.3 (H) 1.7 - 7.7 K/uL   Lymphocytes Relative 8 (L) 12 - 46 %   Lymphs Abs 1.1 0.7 - 4.0 K/uL   Monocytes Relative 8 3 - 12 %   Monocytes Absolute 1.1 (H) 0.1 - 1.0 K/uL   Eosinophils Relative 1 0 - 5 %   Eosinophils Absolute 0.1 0.0 - 0.7 K/uL   Basophils Relative 0 0 - 1 %   Basophils Absolute 0.0 0.0 - 0.1 K/uL    MDM   Final diagnoses:  Wound dehiscence, initial encounter  Malignant ascites  Hypokalemia    Partial wound dehiscence. Old records were reviewed including records from [place] [facility] healthcare system and she had surgery for ovarian cancer with ascites. She apparently was supposed to have periodic paracentesis done. This appears to be a case of  dehiscence from her from her ascites which has been relieved with the fluid passing. Screening labs will be obtained. She will probably need to be set up for routine paracentesis or possibly a peritoneal drain to help her to serve the integrity of the surgical wound.  Potassium is noted to be very low. Patient states that she had not been able to hold her potassium tablets down but now has medicine for nausea and feels she can alternate potassium down. IV access is problematic so she will be treated with oral potassium replacement. She is advised to take her 20 mEq potassium tablet 3 times a day and to take 2 tablets at a time with each dose. Do not for the next 3 days before resuming her current dose. She is advised to begin touch with her surgeon who may want to inspect the wound. However, there is no evidence of infection. She will need to make arrangements for periodic paracentesis.  Delora Fuel, MD [id number]

## 2015-03-10 LAB — COMPREHENSIVE METABOLIC PANEL
ALBUMIN: 2.1 g/dL — AB (ref 3.5–5.0)
ALT: 17 U/L (ref 14–54)
AST: 24 U/L (ref 15–41)
Alkaline Phosphatase: 79 U/L (ref 38–126)
Anion gap: 8 (ref 5–15)
BILIRUBIN TOTAL: 0.5 mg/dL (ref 0.3–1.2)
BUN: 8 mg/dL (ref 6–20)
CHLORIDE: 99 mmol/L — AB (ref 101–111)
CO2: 27 mmol/L (ref 22–32)
CREATININE: 0.49 mg/dL (ref 0.44–1.00)
Calcium: 7.6 mg/dL — ABNORMAL LOW (ref 8.9–10.3)
GFR calc Af Amer: >60 mL/min (ref >60)
GFR calc non Af Amer: >60 mL/min (ref >60)
GLUCOSE: 116 mg/dL — AB (ref 70–99)
POTASSIUM: 2.8 mmol/L — AB (ref 3.5–5.1)
SODIUM: 134 mmol/L — AB (ref 135–145)
Total Protein: 5.6 g/dL — ABNORMAL LOW (ref 6.5–8.1)

## 2015-03-10 LAB — CBC WITH DIFFERENTIAL/PLATELET
BASOS ABS: 0 10*3/uL (ref 0.0–0.1)
Basophils Relative: 0 % (ref 0–1)
EOS ABS: 0.1 10*3/uL (ref 0.0–0.7)
EOS PCT: 1 % (ref 0–5)
HEMATOCRIT: 29.7 % — AB (ref 36.0–46.0)
Hemoglobin: 10.1 g/dL — ABNORMAL LOW (ref 12.0–15.0)
LYMPHS ABS: 1.1 10*3/uL (ref 0.7–4.0)
Lymphocytes Relative: 8 % — ABNORMAL LOW (ref 12–46)
MCH: 31.2 pg (ref 26.0–34.0)
MCHC: 34 g/dL (ref 30.0–36.0)
MCV: 91.7 fL (ref 78.0–100.0)
Monocytes Absolute: 1.1 10*3/uL — ABNORMAL HIGH (ref 0.1–1.0)
Monocytes Relative: 8 % (ref 3–12)
Neutro Abs: 11.3 10*3/uL — ABNORMAL HIGH (ref 1.7–7.7)
Neutrophils Relative %: 83 % — ABNORMAL HIGH (ref 43–77)
Platelets: 670 10*3/uL — ABNORMAL HIGH (ref 150–400)
RBC: 3.24 MIL/uL — ABNORMAL LOW (ref 3.87–5.11)
RDW: 15.1 % (ref 11.5–15.5)
WBC: 13.6 10*3/uL — AB (ref 4.0–10.5)

## 2015-03-10 MED ORDER — POTASSIUM CHLORIDE CRYS ER 20 MEQ PO TBCR
40.0000 meq | EXTENDED_RELEASE_TABLET | Freq: Once | ORAL | Status: AC
  Administered 2015-03-10: 40 meq via ORAL
  Filled 2015-03-10: qty 2

## 2015-03-10 MED ORDER — POTASSIUM CHLORIDE CRYS ER 20 MEQ PO TBCR
40.0000 meq | EXTENDED_RELEASE_TABLET | Freq: Three times a day (TID) | ORAL | Status: DC
Start: 2015-03-10 — End: 2015-04-25

## 2015-03-10 NOTE — Discharge Instructions (Signed)
Call your surgeon at he received [facility]. They may want to see you because of the opening in the wound. Make arrangements for paracentesis. This may need to be done fairly frequently to minimize pressure on the surgical wound. Your potassium was very low today. Please take 2 of your potassium tablets at a time 3 times a day for the next 3 days. Then resume taking it once a day.   Hypokalemia Hypokalemia means that the amount of potassium in the blood is lower than normal.Potassium is a chemical, called an electrolyte, that helps regulate the amount of fluid in the body. It also stimulates muscle contraction and helps nerves function properly.Most of the body's potassium is inside of cells, and only a very small amount is in the blood. Because the amount in the blood is so small, minor changes can be life-threatening. CAUSES  Antibiotics.  Diarrhea or vomiting.  Using laxatives too much, which can cause diarrhea.  Chronic kidney disease.  Water pills (diuretics).  Eating disorders (bulimia).  Low magnesium level.  Sweating a lot. SIGNS AND SYMPTOMS  Weakness.  Constipation.  Fatigue.  Muscle cramps.  Mental confusion.  Skipped heartbeats or irregular heartbeat (palpitations).  Tingling or numbness. DIAGNOSIS  Your health care provider can diagnose hypokalemia with blood tests. In addition to checking your potassium level, your health care provider may also check other lab tests. TREATMENT Hypokalemia can be treated with potassium supplements taken by mouth or adjustments in your current medicines. If your potassium level is very low, you may need to get potassium through a vein (IV) and be monitored in the hospital. A diet high in potassium is also helpful. Foods high in potassium are:  Nuts, such as peanuts and pistachios.  Seeds, such as sunflower seeds and pumpkin seeds.  Peas, lentils, and lima beans.  Whole grain and bran cereals and  breads.  Fresh fruit and vegetables, such as apricots, avocado, bananas, cantaloupe, kiwi, oranges, tomatoes, asparagus, and potatoes.  Orange and tomato juices.  Red meats.  Fruit yogurt. HOME CARE INSTRUCTIONS  Take all medicines as prescribed by your health care provider.  Maintain a healthy diet by including nutritious food, such as fruits, vegetables, nuts, whole grains, and lean meats.  If you are taking a laxative, be sure to follow the directions on the label. SEEK MEDICAL CARE IF:  Your weakness gets worse.  You feel your heart pounding or racing.  You are vomiting or having diarrhea.  You are diabetic and having trouble keeping your blood glucose in the normal range. SEEK IMMEDIATE MEDICAL CARE IF:  You have chest pain, shortness of breath, or dizziness.  You are vomiting or having diarrhea for more than 2 days.  You faint. MAKE SURE YOU:   Understand these instructions.  Will watch your condition.  Will get help right away if you are not doing well or get worse. Document Released: 10/25/2005 Document Revised: 08/15/2013 Document Reviewed: 04/27/2013 [facility] Patient Information 2015 [place], [place]. This information is not intended to replace advice given to you by your health care provider. Make sure you discuss any questions you have with your health care provider.

## 2015-03-11 ENCOUNTER — Telehealth: Payer: Self-pay | Admitting: *Deleted

## 2015-03-11 NOTE — Telephone Encounter (Signed)
Was recently diagnosed with breat cancer at [facility] and was told by them to call us for an appt to get treatment here. I advised her to have [facility] refer her and send records to [place]. I gave her the fax and phone number

## 2015-03-21 ENCOUNTER — Inpatient Hospital Stay: Payer: Medicaid Other | Attending: Oncology | Admitting: Oncology

## 2015-03-21 ENCOUNTER — Encounter: Payer: Self-pay | Admitting: Oncology

## 2015-03-21 VITALS — BP 100/84 | HR 112 | Temp 98.7°F | Resp 20 | Ht 61.81 in | Wt 119.3 lb

## 2015-03-21 DIAGNOSIS — C7962 Secondary malignant neoplasm of left ovary: Secondary | ICD-10-CM | POA: Diagnosis not present

## 2015-03-21 DIAGNOSIS — C50912 Malignant neoplasm of unspecified site of left female breast: Secondary | ICD-10-CM | POA: Insufficient documentation

## 2015-03-21 DIAGNOSIS — C50911 Malignant neoplasm of unspecified site of right female breast: Secondary | ICD-10-CM

## 2015-03-21 DIAGNOSIS — C772 Secondary and unspecified malignant neoplasm of intra-abdominal lymph nodes: Secondary | ICD-10-CM | POA: Diagnosis not present

## 2015-03-21 DIAGNOSIS — Z170 Estrogen receptor positive status [ER+]: Secondary | ICD-10-CM | POA: Diagnosis not present

## 2015-03-21 DIAGNOSIS — C7961 Secondary malignant neoplasm of right ovary: Secondary | ICD-10-CM | POA: Insufficient documentation

## 2015-03-26 ENCOUNTER — Ambulatory Visit
Admission: RE | Admit: 2015-03-26 | Discharge: 2015-03-26 | Disposition: A | Discharge status: Home / Self Care | Payer: Medicaid Other | Source: Ambulatory Visit | Attending: Oncology | Admitting: Oncology

## 2015-03-26 DIAGNOSIS — C50911 Malignant neoplasm of unspecified site of right female breast: Secondary | ICD-10-CM | POA: Diagnosis present

## 2015-03-26 DIAGNOSIS — N839 Noninflammatory disorder of ovary, fallopian tube and broad ligament, unspecified: Secondary | ICD-10-CM | POA: Insufficient documentation

## 2015-03-26 DIAGNOSIS — C775 Secondary and unspecified malignant neoplasm of intrapelvic lymph nodes: Secondary | ICD-10-CM | POA: Insufficient documentation

## 2015-03-26 LAB — GLUCOSE, CAPILLARY: GLUCOSE-CAPILLARY: 88 mg/dL (ref 65–99)

## 2015-03-26 MED ORDER — FLUDEOXYGLUCOSE F - 18 (FDG) INJECTION
12.7800 | Freq: Once | INTRAVENOUS | Status: AC | PRN
  Administered 2015-03-26: 12.78 via INTRAVENOUS

## 2015-04-03 ENCOUNTER — Other Ambulatory Visit: Payer: Self-pay | Admitting: *Deleted

## 2015-04-03 ENCOUNTER — Telehealth: Payer: Self-pay | Admitting: *Deleted

## 2015-04-03 DIAGNOSIS — C50411 Malignant neoplasm of upper-outer quadrant of right female breast: Secondary | ICD-10-CM

## 2015-04-03 NOTE — Telephone Encounter (Signed)
Patient called today for financial assistance for her metastatic breast cancer.  She was diagnosed in 2012 with breast cancer through our BCCCP program.  Currently back under the care of Dr. Grayland Ormond for new diagnosis of metastatic breast cancer.  Discussed case with Criss Alvine, BCCCP nurse consultant, for approval to reapply for Medicaid.  Per Jocelyn Lamer, the quidelines are very grey in regards to metastatic disease.  I have to decided to move forward with the Medicaid application since the patient was in BCCCP at the time of her original diagnosis.  Patient will be in the [facility] office tomorrow and can sign the application.  Tillie Rung, Dr. Gary Fleet nurse will have the patient sign the application, fax to [place] in [facility], and bring the paperwork back to me to file.  Patient is agreeable to the plan.

## 2015-04-04 ENCOUNTER — Encounter: Payer: Self-pay | Admitting: Oncology

## 2015-04-04 ENCOUNTER — Inpatient Hospital Stay (HOSPITAL_BASED_OUTPATIENT_CLINIC_OR_DEPARTMENT_OTHER): Payer: Medicaid Other | Admitting: Oncology

## 2015-04-04 ENCOUNTER — Inpatient Hospital Stay: Payer: Medicaid Other

## 2015-04-04 VITALS — BP 120/86 | HR 100 | Temp 99.5°F | Wt 119.0 lb

## 2015-04-04 DIAGNOSIS — C50911 Malignant neoplasm of unspecified site of right female breast: Secondary | ICD-10-CM

## 2015-04-04 DIAGNOSIS — N839 Noninflammatory disorder of ovary, fallopian tube and broad ligament, unspecified: Secondary | ICD-10-CM

## 2015-04-04 DIAGNOSIS — C50912 Malignant neoplasm of unspecified site of left female breast: Secondary | ICD-10-CM

## 2015-04-04 DIAGNOSIS — C775 Secondary and unspecified malignant neoplasm of intrapelvic lymph nodes: Secondary | ICD-10-CM

## 2015-04-04 LAB — CBC WITH DIFFERENTIAL/PLATELET
BASOS PCT: 1 %
Basophils Absolute: 0.1 10*3/uL (ref 0–0.1)
EOS ABS: 0.1 10*3/uL (ref 0–0.7)
Eosinophils Relative: 1 %
HCT: 29.9 % — ABNORMAL LOW (ref 35.0–47.0)
Hemoglobin: 10 g/dL — ABNORMAL LOW (ref 12.0–16.0)
LYMPHS ABS: 1.5 10*3/uL (ref 1.0–3.6)
Lymphocytes Relative: 15 %
MCH: 29.7 pg (ref 26.0–34.0)
MCHC: 33.5 g/dL (ref 32.0–36.0)
MCV: 88.6 fL (ref 80.0–100.0)
MONO ABS: 0.5 10*3/uL (ref 0.2–0.9)
MONOS PCT: 5 %
NEUTROS ABS: 7.8 10*3/uL — AB (ref 1.4–6.5)
Neutrophils Relative %: 78 %
Platelets: 551 10*3/uL — ABNORMAL HIGH (ref 150–440)
RBC: 3.38 MIL/uL — ABNORMAL LOW (ref 3.80–5.20)
RDW: 17 % — ABNORMAL HIGH (ref 11.5–14.5)
WBC: 9.9 10*3/uL (ref 3.6–11.0)

## 2015-04-04 LAB — COMPREHENSIVE METABOLIC PANEL
ALK PHOS: 84 U/L (ref 38–126)
ALT: 16 U/L (ref 14–54)
AST: 19 U/L (ref 15–41)
Albumin: 2.6 g/dL — ABNORMAL LOW (ref 3.5–5.0)
Anion gap: 10 (ref 5–15)
BUN: 7 mg/dL (ref 6–20)
CHLORIDE: 99 mmol/L — AB (ref 101–111)
CO2: 26 mmol/L (ref 22–32)
Calcium: 8.1 mg/dL — ABNORMAL LOW (ref 8.9–10.3)
Creatinine, Ser: 0.53 mg/dL (ref 0.44–1.00)
GFR calc Af Amer: >60 mL/min (ref >60)
GFR calc non Af Amer: >60 mL/min (ref >60)
Glucose, Bld: 117 mg/dL — ABNORMAL HIGH (ref 65–99)
Potassium: 3.9 mmol/L (ref 3.5–5.1)
SODIUM: 135 mmol/L (ref 135–145)
TOTAL PROTEIN: 6.5 g/dL (ref 6.5–8.1)
Total Bilirubin: 0.3 mg/dL (ref 0.3–1.2)

## 2015-04-04 MED ORDER — HEPARIN SOD (PORK) LOCK FLUSH 100 UNIT/ML IV SOLN
500.0000 [IU] | Freq: Once | INTRAVENOUS | Status: AC
  Administered 2015-04-04: 500 [IU] via INTRAVENOUS
  Filled 2015-04-04: qty 5

## 2015-04-04 MED ORDER — SODIUM CHLORIDE 0.9 % IJ SOLN
10.0000 mL | INTRAMUSCULAR | Status: DC | PRN
  Administered 2015-04-04: 10 mL via INTRAVENOUS
  Filled 2015-04-04: qty 10

## 2015-04-04 NOTE — Progress Notes (Signed)
Pt here with  a new metastatic breast cancer. Had breast cancer 4 years ago. Todays visit is for patient to get her results of PET scan and for MD to discuss a treatment plan for her. Port intact and flushed today.

## 2015-04-05 LAB — CA 125: CA 125: 29.1 U/mL (ref 0.0–38.1)

## 2015-04-05 LAB — CANCER ANTIGEN 27.29: CA 27.29: 78.9 U/mL — ABNORMAL HIGH (ref 0.0–38.6)

## 2015-04-08 ENCOUNTER — Telehealth: Payer: Self-pay | Admitting: *Deleted

## 2015-04-08 ENCOUNTER — Telehealth: Payer: Self-pay | Admitting: Oncology

## 2015-04-08 NOTE — Telephone Encounter (Signed)
She is having difficulty urinating. Please call: [phone].

## 2015-04-08 NOTE — Telephone Encounter (Signed)
Talked to patient today.  She wanted to know if her BCCCP Medicaid application was sent to DSS.  Faxed application to [employer] at [place].  Patient is anxious that application is processed this month.  She is going to call DSS to ensure it is processed quickly.  She is to call me back if she has any questions or needs.

## 2015-04-09 DIAGNOSIS — C50919 Malignant neoplasm of unspecified site of unspecified female breast: Secondary | ICD-10-CM | POA: Insufficient documentation

## 2015-04-09 NOTE — Progress Notes (Signed)
[place]  [phone] [phone]  ID: [name] OB: 1971-08-16  MR#: [id number]  CSN#:[id number]  Patient Care Team: [facility] as PCP - General  CHIEF COMPLAINT:  Chief Complaint  Patient presents with  . Follow-up    Patient history of breast cancer.  A recent work up at [facility] including path results came back metastatic breast    INTERVAL HISTORY: Patient last seen in clinic in February 2014. She returns to clinic for further evaluation and treatment planning after recently being diagnosed with metastatic breast cancer. She initially presented with increasing ascites and abdominal pain. Subsequent workup revealed a significant amount of fluid as well as bilateral ovarian masses. Patient was subsequently transferred to [facility] for debulking surgery under the impression that this was a new ovarian cancer. Once pathology was resulted, it was confirmed that this was a recurrence of her previously known breast cancer. Currently, patient still has residual pain from her surgery but otherwise feels well. She has increased weakness and fatigue. She denies any fevers. Patient offers no further specific complaints.  REVIEW OF SYSTEMS:   Review of Systems  Constitutional: Positive for weight loss and malaise/fatigue. Negative for fever.  Respiratory: Negative.   Cardiovascular: Negative.   Gastrointestinal: Negative.   Neurological: Negative for headaches.    As per HPI. Otherwise, a complete review of systems is negatve.  PAST MEDICAL HISTORY: Past Medical History  Diagnosis Date  . Asthma 2005  . Hypertension 2012  . Personal history of tobacco use, presenting hazards to health   . Myocardial infarction 10/2012  . Cancer 2012    breast cancer, right-treated with neoadjunvant chemotherapy followed by partial mastectomy with sn bx and radiation. Pt is currently taking Tamoxifen since January 2013   . Lump or mass in  breast 2012    right  . Malignant neoplasm of upper-outer quadrant of female breast 2012    right  . Breast cancer   . CAD (coronary artery disease)   . MI (myocardial infarction)     PAST SURGICAL HISTORY: Past Surgical History  Procedure Laterality Date  . Portacath placement    . Cesarean section  [id number]  . Tubal ligation  2004  . Breast lumpectomy Right 2012  . Finger surgery Left 1986    reconstruction, left pinky  . Wrist surgery Left 1987  . Coronary angioplasty with stent placement  20123    Myocardial infarction  . Left heart catheterization with coronary angiogram N/A 10/16/2012    Procedure: LEFT HEART CATHETERIZATION WITH CORONARY ANGIOGRAM;  Surgeon: Clent Demark, MD;  Location: [facility] CATH LAB;  Service: Cardiovascular;  Laterality: N/A;    FAMILY HISTORY Family History  Problem Relation Age of Onset  . Cancer Maternal Grandmother 69    breast       ADVANCED DIRECTIVES:    HEALTH MAINTENANCE: History  Substance Use Topics  . Smoking status: Current Every Day Smoker -- 1.00 packs/day for 15 years    Types: Cigarettes  . Smokeless tobacco: Not on file  . Alcohol Use: No     Colonoscopy:  PAP:  Bone density:  Lipid panel:  Allergies  Allergen Reactions  . Codeine Nausea And Vomiting  . Penicillins Rash    Current Outpatient Prescriptions  Medication Sig Dispense Refill  . albuterol (PROVENTIL HFA;VENTOLIN HFA) 108 (90 BASE) MCG/ACT inhaler Inhale 2 puffs into the lungs every 6 (six) hours as needed for wheezing or shortness of breath. 1  Inhaler 0  . aspirin EC 81 MG EC tablet Take 1 tablet (81 mg total) by mouth daily. 30 tablet 3  . docusate sodium (COLACE) 100 MG capsule Take 100 mg by mouth 2 (two) times daily.    Marland Kitchen enoxaparin (LOVENOX) 40 MG/0.4ML injection Inject 40 mg into the skin daily.    Marland Kitchen ibuprofen (ADVIL,MOTRIN) 600 MG tablet Take 600 mg by mouth every 6 (six) hours as needed.    Marland Kitchen lisinopril (PRINIVIL,ZESTRIL) 5 MG tablet Take 1  tablet (5 mg total) by mouth daily. (Patient taking differently: Take 2.5 mg by mouth daily. ) 30 tablet 0  . metoprolol tartrate (LOPRESSOR) 25 MG tablet Take 0.5 tablets (12.5 mg total) by mouth 2 (two) times daily. 30 tablet 0  . nitroGLYCERIN (NITROSTAT) 0.4 MG SL tablet Place 1 tablet (0.4 mg total) under the tongue every 5 (five) minutes x 3 doses as needed for chest pain. 30 tablet 0  . omeprazole (PRILOSEC) 20 MG capsule Take 20 mg by mouth.    . oxyCODONE-acetaminophen (PERCOCET/ROXICET) 5-325 MG per tablet Take 1-2 tablets by mouth every 4 (four) hours as needed. 20 tablet 0  . potassium chloride 20 MEQ TBCR Take 10 mEq by mouth 2 (two) times daily. 10 tablet 0  . bismuth subsalicylate (PEPTO BISMOL) 262 MG/15ML suspension Take 30 mLs by mouth every 6 (six) hours as needed for indigestion.    . furosemide (LASIX) 20 MG tablet Take 1 tablet (20 mg total) by mouth daily. 5 tablet 0  . oxyCODONE (OXY IR/ROXICODONE) 5 MG immediate release tablet Take 5 mg by mouth every 4 (four) hours as needed for severe pain.    . potassium chloride SA (K-DUR,KLOR-CON) 20 MEQ tablet Take 2 tablets (40 mEq total) by mouth 3 (three) times daily. 60 tablet 0   No current facility-administered medications for this visit.    OBJECTIVE: Filed Vitals:   03/21/15 1106  BP: 100/84  Pulse: 112  Temp: 98.7 F (37.1 C)  Resp: 20     Body mass index is 21.95 kg/(m^2).    ECOG FS:1 - Symptomatic but completely ambulatory  General: Well-developed, well-nourished, no acute distress. Eyes: Pink conjunctiva, anicteric sclera. Breasts: Exam deferred today. Lungs: Clear to auscultation bilaterally. Heart: Regular rate and rhythm. No rubs, murmurs, or gallops. Abdomen: Soft, nontender, nondistended. Surgical dressing clean, dry, intact. Musculoskeletal: No edema, cyanosis, or clubbing. Neuro: Alert, answering all questions appropriately. Cranial nerves grossly intact. Skin: No rashes or petechiae noted. Psych:  Normal affect.   LAB RESULTS:  Lab Results  Component Value Date   NA 135 04/04/2015   K 3.9 04/04/2015   CL 99* 04/04/2015   CO2 26 04/04/2015   GLUCOSE 117* 04/04/2015   BUN 7 04/04/2015   CREATININE 0.53 04/04/2015   CALCIUM 8.1* 04/04/2015   PROT 6.5 04/04/2015   ALBUMIN 2.6* 04/04/2015   AST 19 04/04/2015   ALT 16 04/04/2015   ALKPHOS 84 04/04/2015   BILITOT 0.3 04/04/2015   GFRNONAA >60 04/04/2015   GFRAA >60 04/04/2015    Lab Results  Component Value Date   WBC 9.9 04/04/2015   NEUTROABS 7.8* 04/04/2015   HGB 10.0* 04/04/2015   HCT 29.9* 04/04/2015   MCV 88.6 04/04/2015   PLT 551* 04/04/2015     STUDIES: Nm Pet Image Initial (pi) Whole Body  03/26/2015   CLINICAL DATA:  Initial treatment strategy for evaluation of stage IV breast cancer.  EXAM: NUCLEAR MEDICINE PET SKULL BASE TO THIGH  TECHNIQUE:  12.8 mCi F-18 FDG was injected intravenously. Full-ring PET imaging was performed from the skull base to thigh after the radiotracer. CT data was obtained and used for attenuation correction and anatomic localization.  FASTING BLOOD GLUCOSE:  Value:  88 mg/dl  COMPARISON:  Abdominal pelvic CT 02/19/2015. Chest CT 02/18/2015. No prior PET.  FINDINGS: NECK  No areas of abnormal hypermetabolism.  CHEST  Interventricular septal hypermetabolism may relate to prior infarct (given hypoattenuation within the distal septum on image 42 of series 2 on the prior chest CT and a left anterior descending coronary stent.) This measures a S.U.V. max of 4.4.  ABDOMEN/PELVIS  Peritoneal/ periumbilical hypermetabolism. This measures a S.U.V. max of 4.2, including on image 184 of series 3.  Anterior pelvic fluid with peritoneal thickening and hypermetabolism. Example right peritoneal hypermetabolism measuring a S.U.V. max of 5.7 on image 212.  Pelvic nodal metastasis. A right external iliac node measures 1.2 cm and a S.U.V. max of 3.8 on image 221. A left external iliac node measures 7 mm and a  S.U.V. max of 2.8 on image 221.  SKELETON  No focal osseous hypermetabolism. An area of skin/subcutaneous thickening corresponds to hypermetabolism superficial to the proximal left humerus anteriorly. This measures a S.U.V. max of 1.7 on image 40.  CT IMAGES PERFORMED FOR ATTENUATION CORRECTION  No cervical adenopathy.  Chest, abdomen, and pelvic findings deferred to recent diagnostic CTs. A left Port-A-Cath terminates at the cavoatrial junction. Moderate centrilobular emphysema. Decrease an abdominal pelvic ascites. Age advanced aortic atherosclerosis. Hysterectomy. Air within the vagina could be postoperative. Resection of bilateral adnexal masses.  IMPRESSION: 1. 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 2. Pelvic nodal metastasis. 3. No supradiaphragmatic disease identified. 4. Skin/subcutaneous thickening and mild hypermetabolism superficial the proximal left humerus. Consider physical exam correlation. 5. 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 6. Interval hysterectomy and bilateral ovarian mass resection/oophorectomy. Air within the vagina could be postoperative. Correlate with symptoms to suggest infection or fistulous communication to bowel.   Electronically Signed   By: Abigail Miyamoto M.D.   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 ER/PR positive, HER-2/neu not overexpressing. Original primary was in left breast.  PLAN:    1. Breast cancer: Previously, patient finished her adjuvant chemotherapy with Adriamycin, Cytoxan, and Taxol in August 2012. Patient was placed on tamoxifen and then letrozole, but was noncompliant and never took her medications as prescribed. She now has stage IV disease primarily confined to her abdomen with bilateral ovarian metastases and malignant ascites. Patient had optimal debulking surgery  at [facility] several weeks ago. She will benefit from palliative chemotherapy. Will get PET scan prior to initiating treatment. Return to clinic in 2 weeks for further evaluation and treatment planning.  Once patient completes chemotherapy, we will consider raising her back on an aromatase inhibitor. Patient expressed understanding and was in agreement with this plan.  Greater than 30 minutes was spent in discussion and consultation.   Patient expressed understanding and was in agreement with this plan. She also understands that She can call clinic at any time with any questions, concerns, or complaints.   No matching staging information was found for the patient.  Lloyd Huger, MD   04/09/2015 1:10 PM

## 2015-04-09 NOTE — Telephone Encounter (Signed)
Please advise 

## 2015-04-09 NOTE — Telephone Encounter (Signed)
Patient remembered her grandson will be on summer break by June 10. She can come earlier, no problem. Thanks.

## 2015-04-10 ENCOUNTER — Inpatient Hospital Stay: Payer: Medicaid Other | Attending: Oncology

## 2015-04-10 DIAGNOSIS — Z87891 Personal history of nicotine dependence: Secondary | ICD-10-CM | POA: Diagnosis not present

## 2015-04-10 DIAGNOSIS — J45909 Unspecified asthma, uncomplicated: Secondary | ICD-10-CM | POA: Insufficient documentation

## 2015-04-10 DIAGNOSIS — Z79899 Other long term (current) drug therapy: Secondary | ICD-10-CM | POA: Diagnosis not present

## 2015-04-10 DIAGNOSIS — Z418 Encounter for other procedures for purposes other than remedying health state: Secondary | ICD-10-CM | POA: Insufficient documentation

## 2015-04-10 DIAGNOSIS — Z7982 Long term (current) use of aspirin: Secondary | ICD-10-CM | POA: Diagnosis not present

## 2015-04-10 DIAGNOSIS — R5383 Other fatigue: Secondary | ICD-10-CM | POA: Diagnosis not present

## 2015-04-10 DIAGNOSIS — Z170 Estrogen receptor positive status [ER+]: Secondary | ICD-10-CM | POA: Insufficient documentation

## 2015-04-10 DIAGNOSIS — I10 Essential (primary) hypertension: Secondary | ICD-10-CM | POA: Insufficient documentation

## 2015-04-10 DIAGNOSIS — R531 Weakness: Secondary | ICD-10-CM | POA: Diagnosis not present

## 2015-04-10 DIAGNOSIS — C7981 Secondary malignant neoplasm of breast: Secondary | ICD-10-CM | POA: Insufficient documentation

## 2015-04-10 DIAGNOSIS — Z9221 Personal history of antineoplastic chemotherapy: Secondary | ICD-10-CM | POA: Insufficient documentation

## 2015-04-10 DIAGNOSIS — Z5111 Encounter for antineoplastic chemotherapy: Secondary | ICD-10-CM | POA: Insufficient documentation

## 2015-04-10 DIAGNOSIS — C50411 Malignant neoplasm of upper-outer quadrant of right female breast: Secondary | ICD-10-CM

## 2015-04-10 DIAGNOSIS — I2510 Atherosclerotic heart disease of native coronary artery without angina pectoris: Secondary | ICD-10-CM | POA: Diagnosis not present

## 2015-04-10 DIAGNOSIS — C7960 Secondary malignant neoplasm of unspecified ovary: Secondary | ICD-10-CM | POA: Insufficient documentation

## 2015-04-10 DIAGNOSIS — C7989 Secondary malignant neoplasm of other specified sites: Secondary | ICD-10-CM | POA: Diagnosis not present

## 2015-04-10 DIAGNOSIS — I252 Old myocardial infarction: Secondary | ICD-10-CM | POA: Diagnosis not present

## 2015-04-10 DIAGNOSIS — R634 Abnormal weight loss: Secondary | ICD-10-CM | POA: Insufficient documentation

## 2015-04-10 DIAGNOSIS — Z923 Personal history of irradiation: Secondary | ICD-10-CM | POA: Diagnosis not present

## 2015-04-10 DIAGNOSIS — C50911 Malignant neoplasm of unspecified site of right female breast: Secondary | ICD-10-CM | POA: Insufficient documentation

## 2015-04-10 DIAGNOSIS — F17210 Nicotine dependence, cigarettes, uncomplicated: Secondary | ICD-10-CM | POA: Insufficient documentation

## 2015-04-10 LAB — URINALYSIS COMPLETE WITH MICROSCOPIC (ARMC ONLY)
Bilirubin Urine: NEGATIVE
Glucose, UA: NEGATIVE mg/dL
Hgb urine dipstick: NEGATIVE
Ketones, ur: NEGATIVE mg/dL
Nitrite: NEGATIVE
Protein, ur: NEGATIVE mg/dL
Specific Gravity, Urine: 1.025 (ref 1.005–1.030)
pH: 5.5 (ref 5.0–8.0)

## 2015-04-11 LAB — URINE CULTURE

## 2015-04-17 ENCOUNTER — Other Ambulatory Visit: Payer: Self-pay | Admitting: Oncology

## 2015-04-17 DIAGNOSIS — C50919 Malignant neoplasm of unspecified site of unspecified female breast: Secondary | ICD-10-CM

## 2015-04-17 MED ORDER — PROCHLORPERAZINE MALEATE 10 MG PO TABS: 10.0000 mg | ORAL_TABLET | Freq: Four times a day (QID) | ORAL | Status: DC | PRN

## 2015-04-17 MED ORDER — LIDOCAINE-PRILOCAINE 2.5-2.5 % EX CREA: TOPICAL_CREAM | CUTANEOUS | Status: DC

## 2015-04-17 MED ORDER — ONDANSETRON HCL 8 MG PO TABS: 8.0000 mg | ORAL_TABLET | Freq: Two times a day (BID) | ORAL | Status: DC

## 2015-04-18 ENCOUNTER — Ambulatory Visit: Payer: Self-pay | Admitting: Oncology

## 2015-04-18 ENCOUNTER — Inpatient Hospital Stay (HOSPITAL_BASED_OUTPATIENT_CLINIC_OR_DEPARTMENT_OTHER): Payer: Medicaid Other | Admitting: Oncology

## 2015-04-18 ENCOUNTER — Inpatient Hospital Stay: Payer: Medicaid Other

## 2015-04-18 ENCOUNTER — Ambulatory Visit: Payer: Self-pay

## 2015-04-18 ENCOUNTER — Other Ambulatory Visit: Payer: Self-pay

## 2015-04-18 VITALS — BP 140/90 | HR 80 | Temp 98.0°F

## 2015-04-18 VITALS — Wt 120.8 lb

## 2015-04-18 DIAGNOSIS — I10 Essential (primary) hypertension: Secondary | ICD-10-CM

## 2015-04-18 DIAGNOSIS — C7981 Secondary malignant neoplasm of breast: Secondary | ICD-10-CM

## 2015-04-18 DIAGNOSIS — C50911 Malignant neoplasm of unspecified site of right female breast: Secondary | ICD-10-CM | POA: Diagnosis not present

## 2015-04-18 DIAGNOSIS — Z170 Estrogen receptor positive status [ER+]: Secondary | ICD-10-CM

## 2015-04-18 DIAGNOSIS — Z9221 Personal history of antineoplastic chemotherapy: Secondary | ICD-10-CM

## 2015-04-18 DIAGNOSIS — Z79899 Other long term (current) drug therapy: Secondary | ICD-10-CM

## 2015-04-18 DIAGNOSIS — Z5111 Encounter for antineoplastic chemotherapy: Secondary | ICD-10-CM | POA: Diagnosis not present

## 2015-04-18 DIAGNOSIS — C50919 Malignant neoplasm of unspecified site of unspecified female breast: Secondary | ICD-10-CM

## 2015-04-18 DIAGNOSIS — Z7982 Long term (current) use of aspirin: Secondary | ICD-10-CM

## 2015-04-18 DIAGNOSIS — C7960 Secondary malignant neoplasm of unspecified ovary: Secondary | ICD-10-CM | POA: Diagnosis not present

## 2015-04-18 DIAGNOSIS — R634 Abnormal weight loss: Secondary | ICD-10-CM

## 2015-04-18 DIAGNOSIS — I252 Old myocardial infarction: Secondary | ICD-10-CM

## 2015-04-18 DIAGNOSIS — F17210 Nicotine dependence, cigarettes, uncomplicated: Secondary | ICD-10-CM

## 2015-04-18 DIAGNOSIS — R5383 Other fatigue: Secondary | ICD-10-CM

## 2015-04-18 DIAGNOSIS — Z923 Personal history of irradiation: Secondary | ICD-10-CM

## 2015-04-18 DIAGNOSIS — C7989 Secondary malignant neoplasm of other specified sites: Secondary | ICD-10-CM

## 2015-04-18 DIAGNOSIS — R531 Weakness: Secondary | ICD-10-CM

## 2015-04-18 DIAGNOSIS — I2510 Atherosclerotic heart disease of native coronary artery without angina pectoris: Secondary | ICD-10-CM

## 2015-04-18 DIAGNOSIS — Z87891 Personal history of nicotine dependence: Secondary | ICD-10-CM

## 2015-04-18 DIAGNOSIS — J45909 Unspecified asthma, uncomplicated: Secondary | ICD-10-CM

## 2015-04-18 LAB — CBC WITH DIFFERENTIAL/PLATELET
BASOS ABS: 0.1 10*3/uL (ref 0–0.1)
Basophils Relative: 1 %
Eosinophils Absolute: 0.1 10*3/uL (ref 0–0.7)
Eosinophils Relative: 2 %
HCT: 35 % (ref 35.0–47.0)
Hemoglobin: 11.6 g/dL — ABNORMAL LOW (ref 12.0–16.0)
LYMPHS ABS: 1.2 10*3/uL (ref 1.0–3.6)
LYMPHS PCT: 16 %
MCH: 29.7 pg (ref 26.0–34.0)
MCHC: 33.2 g/dL (ref 32.0–36.0)
MCV: 89.5 fL (ref 80.0–100.0)
MONO ABS: 0.5 10*3/uL (ref 0.2–0.9)
Monocytes Relative: 6 %
Neutro Abs: 5.7 10*3/uL (ref 1.4–6.5)
Neutrophils Relative %: 75 %
Platelets: 361 10*3/uL (ref 150–440)
RBC: 3.91 MIL/uL (ref 3.80–5.20)
RDW: 18 % — ABNORMAL HIGH (ref 11.5–14.5)
WBC: 7.6 10*3/uL (ref 3.6–11.0)

## 2015-04-18 LAB — COMPREHENSIVE METABOLIC PANEL
ALT: 14 U/L (ref 14–54)
AST: 18 U/L (ref 15–41)
Albumin: 3.2 g/dL — ABNORMAL LOW (ref 3.5–5.0)
Alkaline Phosphatase: 71 U/L (ref 38–126)
Anion gap: 8 (ref 5–15)
BUN: 6 mg/dL (ref 6–20)
CO2: 26 mmol/L (ref 22–32)
Calcium: 8.4 mg/dL — ABNORMAL LOW (ref 8.9–10.3)
Chloride: 104 mmol/L (ref 101–111)
Creatinine, Ser: 0.55 mg/dL (ref 0.44–1.00)
GFR calc Af Amer: >60 mL/min (ref >60)
GFR calc non Af Amer: >60 mL/min (ref >60)
Glucose, Bld: 111 mg/dL — ABNORMAL HIGH (ref 65–99)
Potassium: 3.8 mmol/L (ref 3.5–5.1)
Sodium: 138 mmol/L (ref 135–145)
Total Bilirubin: 0.3 mg/dL (ref 0.3–1.2)
Total Protein: 6.7 g/dL (ref 6.5–8.1)

## 2015-04-18 MED ORDER — HEPARIN SOD (PORK) LOCK FLUSH 100 UNIT/ML IV SOLN
500.0000 [IU] | Freq: Once | INTRAVENOUS | Status: AC
  Administered 2015-04-18: 500 [IU] via INTRAVENOUS
  Filled 2015-04-18: qty 5

## 2015-04-18 MED ORDER — SODIUM CHLORIDE 0.9 % IV SOLN
1000.0000 mg/m2 | Freq: Once | INTRAVENOUS | Status: AC
  Administered 2015-04-18: 1520 mg via INTRAVENOUS
  Filled 2015-04-18: qty 40

## 2015-04-18 MED ORDER — PROCHLORPERAZINE MALEATE 10 MG PO TABS
10.0000 mg | ORAL_TABLET | Freq: Once | ORAL | Status: DC
Start: 2015-04-18 — End: 2015-04-18

## 2015-04-18 MED ORDER — SODIUM CHLORIDE 0.9 % IJ SOLN
10.0000 mL | INTRAMUSCULAR | Status: DC | PRN
  Administered 2015-04-18: 10 mL via INTRAVENOUS
  Filled 2015-04-18: qty 10

## 2015-04-18 MED ORDER — SODIUM CHLORIDE 0.9 % IV SOLN
Freq: Once | INTRAVENOUS | Status: AC
  Administered 2015-04-18: 10:00:00 via INTRAVENOUS
  Filled 2015-04-18: qty 1000

## 2015-04-18 MED ORDER — SODIUM CHLORIDE 0.9 % IV SOLN
Freq: Once | INTRAVENOUS | Status: AC
  Administered 2015-04-18: 10:00:00 via INTRAVENOUS
  Filled 2015-04-18: qty 4

## 2015-04-21 NOTE — Progress Notes (Signed)
[place]  Telephone:([phone] [phone]  ID: [name] OB: 1971-01-04  MR#: [id number]  GUR#:[id number]  Patient Care Team: [facility] as PCP - General  CHIEF COMPLAINT:  Chief Complaint  Patient presents with  . Follow-up    Breast Cancer    INTERVAL HISTORY: Patient returns to clinic today for further evaluation, discussion of her imaging results, and treatment planning. She has increased weakness and fatigue. She denies any fevers. She does not complain of any further pain. She denies chest pain or shortness of breath. She denies any nausea, vomiting, constipation, or diarrhea. She has no urinary complaints. Patient offers no further specific complaints.  REVIEW OF SYSTEMS:   Review of Systems  Constitutional: Positive for weight loss and malaise/fatigue. Negative for fever.  Respiratory: Negative.   Cardiovascular: Negative.   Gastrointestinal: Negative.   Neurological: Negative for headaches.    As per HPI. Otherwise, a complete review of systems is negatve.  PAST MEDICAL HISTORY: Past Medical History  Diagnosis Date  . Asthma 2005  . Hypertension 2012  . Personal history of tobacco use, presenting hazards to health   . Myocardial infarction 10/2012  . Cancer 2012    breast cancer, right-treated with neoadjunvant chemotherapy followed by partial mastectomy with sn bx and radiation. Pt is currently taking Tamoxifen since January 2013   . Lump or mass in breast 2012    right  . Malignant neoplasm of upper-outer quadrant of female breast 2012    right  . Breast cancer   . CAD (coronary artery disease)   . MI (myocardial infarction)     PAST SURGICAL HISTORY: Past Surgical History  Procedure Laterality Date  . Portacath placement    . Cesarean section  [id number]  . Tubal ligation  2004  . Breast lumpectomy Right 2012  . Finger surgery Left 1986    reconstruction, left pinky  . Wrist surgery Left  1987  . Coronary angioplasty with stent placement  20123    Myocardial infarction  . Left heart catheterization with coronary angiogram N/A 10/16/2012    Procedure: LEFT HEART CATHETERIZATION WITH CORONARY ANGIOGRAM;  Surgeon: Clent Demark, MD;  Location: [facility] CATH LAB;  Service: Cardiovascular;  Laterality: N/A;    FAMILY HISTORY Family History  Problem Relation Age of Onset  . Cancer Maternal Grandmother 69    breast       ADVANCED DIRECTIVES:    HEALTH MAINTENANCE: History  Substance Use Topics  . Smoking status: Current Every Day Smoker -- 1.00 packs/day for 15 years    Types: Cigarettes  . Smokeless tobacco: Not on file  . Alcohol Use: No     Colonoscopy:  PAP:  Bone density:  Lipid panel:  Allergies  Allergen Reactions  . Codeine Nausea And Vomiting  . Penicillins Rash    Current Outpatient Prescriptions  Medication Sig Dispense Refill  . albuterol (PROVENTIL HFA;VENTOLIN HFA) 108 (90 BASE) MCG/ACT inhaler Inhale 2 puffs into the lungs every 6 (six) hours as needed for wheezing or shortness of breath. 1 Inhaler 0  . aspirin EC 81 MG EC tablet Take 1 tablet (81 mg total) by mouth daily. 30 tablet 3  . bismuth subsalicylate (PEPTO BISMOL) 262 MG/15ML suspension Take 30 mLs by mouth every 6 (six) hours as needed for indigestion.    . docusate sodium (COLACE) 100 MG capsule Take 100 mg by mouth 2 (two) times daily.    . furosemide (LASIX) 20 MG  tablet Take 1 tablet (20 mg total) by mouth daily. 5 tablet 0  . ibuprofen (ADVIL,MOTRIN) 600 MG tablet Take 600 mg by mouth every 6 (six) hours as needed.    Marland Kitchen lisinopril (PRINIVIL,ZESTRIL) 5 MG tablet Take 1 tablet (5 mg total) by mouth daily. (Patient taking differently: Take 2.5 mg by mouth daily. ) 30 tablet 0  . metoprolol tartrate (LOPRESSOR) 25 MG tablet Take 0.5 tablets (12.5 mg total) by mouth 2 (two) times daily. 30 tablet 0  . nitroGLYCERIN (NITROSTAT) 0.4 MG SL tablet Place 1 tablet (0.4 mg total) under the tongue  every 5 (five) minutes x 3 doses as needed for chest pain. 30 tablet 0  . omeprazole (PRILOSEC) 20 MG capsule Take 20 mg by mouth.    . oxyCODONE (OXY IR/ROXICODONE) 5 MG immediate release tablet Take 5 mg by mouth every 4 (four) hours as needed for severe pain.    Marland Kitchen oxyCODONE-acetaminophen (PERCOCET/ROXICET) 5-325 MG per tablet Take 1-2 tablets by mouth every 4 (four) hours as needed. 20 tablet 0  . potassium chloride 20 MEQ TBCR Take 10 mEq by mouth 2 (two) times daily. 10 tablet 0  . potassium chloride SA (K-DUR,KLOR-CON) 20 MEQ tablet Take 2 tablets (40 mEq total) by mouth 3 (three) times daily. 60 tablet 0  . enoxaparin (LOVENOX) 40 MG/0.4ML injection Inject 40 mg into the skin daily.    Marland Kitchen lidocaine-prilocaine (EMLA) cream Apply to affected area once 30 g 3  . ondansetron (ZOFRAN) 8 MG tablet Take 1 tablet (8 mg total) by mouth 2 (two) times daily. Start the day after chemo, then as needed for nausea or vomiting. 30 tablet 1  . prochlorperazine (COMPAZINE) 10 MG tablet Take 1 tablet (10 mg total) by mouth every 6 (six) hours as needed (Nausea or vomiting). 30 tablet 1   No current facility-administered medications for this visit.    OBJECTIVE: Filed Vitals:   04/04/15 1150  BP: 120/86  Pulse: 100  Temp: 99.5 F (37.5 C)     Body mass index is 21.91 kg/(m^2).    ECOG FS:1 - Symptomatic but completely ambulatory  General: Well-developed, well-nourished, no acute distress. Eyes: Pink conjunctiva, anicteric sclera. Breasts: Exam deferred today. Lungs: Clear to auscultation bilaterally. Heart: Regular rate and rhythm. No rubs, murmurs, or gallops. Abdomen: Soft, nontender, nondistended. Surgical dressing clean, dry, intact. Musculoskeletal: No edema, cyanosis, or clubbing. Neuro: Alert, answering all questions appropriately. Cranial nerves grossly intact. Skin: No rashes or petechiae noted. Psych: Normal affect.   LAB RESULTS:  Lab Results  Component Value Date   NA 138  04/18/2015   K 3.8 04/18/2015   CL 104 04/18/2015   CO2 26 04/18/2015   GLUCOSE 111* 04/18/2015   BUN 6 04/18/2015   CREATININE 0.55 04/18/2015   CALCIUM 8.4* 04/18/2015   PROT 6.7 04/18/2015   ALBUMIN 3.2* 04/18/2015   AST 18 04/18/2015   ALT 14 04/18/2015   ALKPHOS 71 04/18/2015   BILITOT 0.3 04/18/2015   GFRNONAA >60 04/18/2015   GFRAA >60 04/18/2015    Lab Results  Component Value Date   WBC 7.6 04/18/2015   NEUTROABS 5.7 04/18/2015   HGB 11.6* 04/18/2015   HCT 35.0 04/18/2015   MCV 89.5 04/18/2015   PLT 361 04/18/2015     STUDIES: Nm Pet Image Initial (pi) Whole Body  03/26/2015   CLINICAL DATA:  Initial treatment strategy for evaluation of stage IV breast cancer.  EXAM: NUCLEAR MEDICINE PET SKULL BASE TO THIGH  TECHNIQUE:  12.8 mCi F-18 FDG was injected intravenously. Full-ring PET imaging was performed from the skull base to thigh after the radiotracer. CT data was obtained and used for attenuation correction and anatomic localization.  FASTING BLOOD GLUCOSE:  Value:  88 mg/dl  COMPARISON:  Abdominal pelvic CT 02/19/2015. Chest CT 02/18/2015. No prior PET.  FINDINGS: NECK  No areas of abnormal hypermetabolism.  CHEST  Interventricular septal hypermetabolism may relate to prior infarct (given hypoattenuation within the distal septum on image 42 of series 2 on the prior chest CT and a left anterior descending coronary stent.) This measures a S.U.V. max of 4.4.  ABDOMEN/PELVIS  Peritoneal/ periumbilical hypermetabolism. This measures a S.U.V. max of 4.2, including on image 184 of series 3.  Anterior pelvic fluid with peritoneal thickening and hypermetabolism. Example right peritoneal hypermetabolism measuring a S.U.V. max of 5.7 on image 212.  Pelvic nodal metastasis. A right external iliac node measures 1.2 cm and a S.U.V. max of 3.8 on image 221. A left external iliac node measures 7 mm and a S.U.V. max of 2.8 on image 221.  SKELETON  No focal osseous hypermetabolism. An area of  skin/subcutaneous thickening corresponds to hypermetabolism superficial to the proximal left humerus anteriorly. This measures a S.U.V. max of 1.7 on image 40.  CT IMAGES PERFORMED FOR ATTENUATION CORRECTION  No cervical adenopathy.  Chest, abdomen, and pelvic findings deferred to recent diagnostic CTs. A left Port-A-Cath terminates at the cavoatrial junction. Moderate centrilobular emphysema. Decrease an abdominal pelvic ascites. Age advanced aortic atherosclerosis. Hysterectomy. Air within the vagina could be postoperative. Resection of bilateral adnexal masses.  IMPRESSION: 1. 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 2. Pelvic nodal metastasis. 3. No supradiaphragmatic disease identified. 4. Skin/subcutaneous thickening and mild hypermetabolism superficial the proximal left humerus. Consider physical exam correlation. 5. 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 6. Interval hysterectomy and bilateral ovarian mass resection/oophorectomy. Air within the vagina could be postoperative. Correlate with symptoms to suggest infection or fistulous communication to bowel.   Electronically Signed   By: Abigail Miyamoto M.D.   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 ER/PR positive, HER-2/neu not overexpressing. Original primary was in left breast.  PLAN:    1. Breast cancer: PET scan results reviewed independently and reported as above. Although patient has a lobular subtype, she will still benefit from palliative chemotherapy. Return to clinic in approximately 2 weeks to initiate cycle 1, day 1 of gemcitabine and Taxotere. She will receive gemcitabine on day 1, and then gemcitabine and Taxotere on day 8. She will have Neulasta support with day 15 off. Plan to do 3-4 cycles and then reimage.  Once patient completes chemotherapy, will  consider reinitiating an aromatase inhibitor. Patient expressed understanding and was in agreement with this plan.  Greater than 30 minutes was spent in discussion and consultation.   Patient expressed understanding and was in agreement with this plan. She also understands that She can call clinic at any time with any questions, concerns, or complaints.   No matching staging information was found for the patient.  Lloyd Huger, MD   04/21/2015 1:45 PM

## 2015-04-25 ENCOUNTER — Inpatient Hospital Stay (HOSPITAL_BASED_OUTPATIENT_CLINIC_OR_DEPARTMENT_OTHER): Payer: Medicaid Other | Admitting: Oncology

## 2015-04-25 ENCOUNTER — Inpatient Hospital Stay: Payer: Medicaid Other

## 2015-04-25 VITALS — BP 135/92 | HR 97 | Temp 98.0°F | Resp 18 | Wt 120.4 lb

## 2015-04-25 DIAGNOSIS — Z923 Personal history of irradiation: Secondary | ICD-10-CM

## 2015-04-25 DIAGNOSIS — C50919 Malignant neoplasm of unspecified site of unspecified female breast: Secondary | ICD-10-CM

## 2015-04-25 DIAGNOSIS — R531 Weakness: Secondary | ICD-10-CM

## 2015-04-25 DIAGNOSIS — Z418 Encounter for other procedures for purposes other than remedying health state: Secondary | ICD-10-CM | POA: Diagnosis not present

## 2015-04-25 DIAGNOSIS — C50912 Malignant neoplasm of unspecified site of left female breast: Secondary | ICD-10-CM

## 2015-04-25 DIAGNOSIS — Z170 Estrogen receptor positive status [ER+]: Secondary | ICD-10-CM

## 2015-04-25 DIAGNOSIS — C7960 Secondary malignant neoplasm of unspecified ovary: Secondary | ICD-10-CM

## 2015-04-25 DIAGNOSIS — I252 Old myocardial infarction: Secondary | ICD-10-CM

## 2015-04-25 DIAGNOSIS — F17210 Nicotine dependence, cigarettes, uncomplicated: Secondary | ICD-10-CM

## 2015-04-25 DIAGNOSIS — Z87891 Personal history of nicotine dependence: Secondary | ICD-10-CM

## 2015-04-25 DIAGNOSIS — J45909 Unspecified asthma, uncomplicated: Secondary | ICD-10-CM

## 2015-04-25 DIAGNOSIS — C7981 Secondary malignant neoplasm of breast: Secondary | ICD-10-CM | POA: Diagnosis not present

## 2015-04-25 DIAGNOSIS — I2510 Atherosclerotic heart disease of native coronary artery without angina pectoris: Secondary | ICD-10-CM

## 2015-04-25 DIAGNOSIS — Z5111 Encounter for antineoplastic chemotherapy: Secondary | ICD-10-CM | POA: Diagnosis not present

## 2015-04-25 DIAGNOSIS — Z9221 Personal history of antineoplastic chemotherapy: Secondary | ICD-10-CM

## 2015-04-25 DIAGNOSIS — C50911 Malignant neoplasm of unspecified site of right female breast: Secondary | ICD-10-CM

## 2015-04-25 DIAGNOSIS — Z7982 Long term (current) use of aspirin: Secondary | ICD-10-CM

## 2015-04-25 DIAGNOSIS — R5383 Other fatigue: Secondary | ICD-10-CM

## 2015-04-25 DIAGNOSIS — C7989 Secondary malignant neoplasm of other specified sites: Secondary | ICD-10-CM

## 2015-04-25 DIAGNOSIS — R634 Abnormal weight loss: Secondary | ICD-10-CM

## 2015-04-25 DIAGNOSIS — Z79899 Other long term (current) drug therapy: Secondary | ICD-10-CM

## 2015-04-25 DIAGNOSIS — I10 Essential (primary) hypertension: Secondary | ICD-10-CM

## 2015-04-25 LAB — CBC WITH DIFFERENTIAL/PLATELET
BASOS ABS: 0 10*3/uL (ref 0–0.1)
Basophils Relative: 1 %
EOS ABS: 0 10*3/uL (ref 0–0.7)
Eosinophils Relative: 1 %
HEMATOCRIT: 32.7 % — AB (ref 35.0–47.0)
Hemoglobin: 10.9 g/dL — ABNORMAL LOW (ref 12.0–16.0)
LYMPHS PCT: 35 %
Lymphs Abs: 1.2 10*3/uL (ref 1.0–3.6)
MCH: 29.6 pg (ref 26.0–34.0)
MCHC: 33.3 g/dL (ref 32.0–36.0)
MCV: 89.1 fL (ref 80.0–100.0)
Monocytes Absolute: 0.3 10*3/uL (ref 0.2–0.9)
Monocytes Relative: 9 %
Neutro Abs: 1.9 10*3/uL (ref 1.4–6.5)
Neutrophils Relative %: 54 %
PLATELETS: 221 10*3/uL (ref 150–440)
RBC: 3.68 MIL/uL — AB (ref 3.80–5.20)
RDW: 17.4 % — AB (ref 11.5–14.5)
WBC: 3.6 10*3/uL (ref 3.6–11.0)

## 2015-04-25 LAB — COMPREHENSIVE METABOLIC PANEL
ALBUMIN: 3.1 g/dL — AB (ref 3.5–5.0)
ALT: 25 U/L (ref 14–54)
AST: 22 U/L (ref 15–41)
Alkaline Phosphatase: 69 U/L (ref 38–126)
Anion gap: 8 (ref 5–15)
BUN: 7 mg/dL (ref 6–20)
CALCIUM: 8.3 mg/dL — AB (ref 8.9–10.3)
CHLORIDE: 104 mmol/L (ref 101–111)
CO2: 25 mmol/L (ref 22–32)
Creatinine, Ser: 0.58 mg/dL (ref 0.44–1.00)
GFR calc non Af Amer: >60 mL/min (ref >60)
Glucose, Bld: 88 mg/dL (ref 65–99)
Potassium: 3.9 mmol/L (ref 3.5–5.1)
Sodium: 137 mmol/L (ref 135–145)
Total Bilirubin: 0.5 mg/dL (ref 0.3–1.2)
Total Protein: 6.5 g/dL (ref 6.5–8.1)

## 2015-04-25 MED ORDER — SODIUM CHLORIDE 0.9 % IV SOLN
Freq: Once | INTRAVENOUS | Status: AC
  Administered 2015-04-25: 10:00:00 via INTRAVENOUS
  Filled 2015-04-25: qty 4

## 2015-04-25 MED ORDER — DOCETAXEL CHEMO INJECTION 160 MG/16ML
75.0000 mg/m2 | Freq: Once | INTRAVENOUS | Status: AC
  Administered 2015-04-25: 110 mg via INTRAVENOUS
  Filled 2015-04-25: qty 11

## 2015-04-25 MED ORDER — HEPARIN SOD (PORK) LOCK FLUSH 100 UNIT/ML IV SOLN
500.0000 [IU] | Freq: Once | INTRAVENOUS | Status: AC | PRN
Start: 2015-04-25 — End: 2015-04-25
  Administered 2015-04-25: 500 [IU]
  Filled 2015-04-25: qty 5

## 2015-04-25 MED ORDER — SODIUM CHLORIDE 0.9 % IV SOLN
Freq: Once | INTRAVENOUS | Status: AC
  Administered 2015-04-25: 09:00:00 via INTRAVENOUS
  Filled 2015-04-25: qty 1000

## 2015-04-25 MED ORDER — SODIUM CHLORIDE 0.9 % IJ SOLN
10.0000 mL | INTRAMUSCULAR | Status: DC | PRN
  Administered 2015-04-25: 10 mL
  Filled 2015-04-25: qty 10

## 2015-04-25 MED ORDER — PEGFILGRASTIM 6 MG/0.6ML ~~LOC~~ PSKT
6.0000 mg | PREFILLED_SYRINGE | Freq: Once | SUBCUTANEOUS | Status: AC
  Administered 2015-04-25: 6 mg via SUBCUTANEOUS
  Filled 2015-04-25: qty 0.6

## 2015-04-25 MED ORDER — GEMCITABINE HCL CHEMO INJECTION 1 GM/26.3ML
1000.0000 mg/m2 | Freq: Once | INTRAVENOUS | Status: AC
  Administered 2015-04-25: 1520 mg via INTRAVENOUS
  Filled 2015-04-25: qty 40

## 2015-04-25 NOTE — Progress Notes (Signed)
[name]  [phone]) [phone] Fax:(336) [phone]  ID: [name] OB: 1971/09/24  MR#: [id number]  MPN#:[id number]  Patient Care Team: [facility] as PCP - General  CHIEF COMPLAINT:  Chief Complaint  Patient presents with  . Follow-up    breast cancer    INTERVAL HISTORY: Patient returns to clinic today for further evaluation and initiation of cycle 1, day 8 of gemcitabine and Taxotere. She tolerated gemcitabine well without significant side effects. She currently feels well. She denies any fevers. She does not complain of any further pain. She denies chest pain or shortness of breath. She denies any nausea, vomiting, constipation, or diarrhea. She has no urinary complaints. Patient offers no further specific complaints.  REVIEW OF SYSTEMS:   Review of Systems  Constitutional: Positive for malaise/fatigue. Negative for fever and weight loss.  Respiratory: Negative.   Cardiovascular: Negative.   Gastrointestinal: Negative.     As per HPI. Otherwise, a complete review of systems is negatve.  PAST MEDICAL HISTORY: Past Medical History  Diagnosis Date  . Asthma 2005  . Hypertension 2012  . Personal history of tobacco use, presenting hazards to health   . Myocardial infarction 10/2012  . Cancer 2012    breast cancer, right-treated with neoadjunvant chemotherapy followed by partial mastectomy with sn bx and radiation. Pt is currently taking Tamoxifen since January 2013   . Lump or mass in breast 2012    right  . Malignant neoplasm of upper-outer quadrant of female breast 2012    right  . Breast cancer   . CAD (coronary artery disease)   . MI (myocardial infarction)     PAST SURGICAL HISTORY: Past Surgical History  Procedure Laterality Date  . Portacath placement    . Cesarean section  [id number]  . Tubal ligation  2004  . Breast lumpectomy Right 2012  . Finger surgery Left 1986    reconstruction, left pinky  . Wrist  surgery Left 1987  . Coronary angioplasty with stent placement  20123    Myocardial infarction  . Left heart catheterization with coronary angiogram N/A 10/16/2012    Procedure: LEFT HEART CATHETERIZATION WITH CORONARY ANGIOGRAM;  Surgeon: Clent Demark, MD;  Location: [facility] CATH LAB;  Service: Cardiovascular;  Laterality: N/A;    FAMILY HISTORY Family History  Problem Relation Age of Onset  . Cancer Maternal Grandmother 69    breast       ADVANCED DIRECTIVES:    HEALTH MAINTENANCE: History  Substance Use Topics  . Smoking status: Current Every Day Smoker -- 1.00 packs/day for 15 years    Types: Cigarettes  . Smokeless tobacco: Not on file  . Alcohol Use: No     Colonoscopy:  PAP:  Bone density:  Lipid panel:  Allergies  Allergen Reactions  . Codeine Nausea And Vomiting  . Penicillins Rash    Current Outpatient Prescriptions  Medication Sig Dispense Refill  . albuterol (PROVENTIL HFA;VENTOLIN HFA) 108 (90 BASE) MCG/ACT inhaler Inhale 2 puffs into the lungs every 6 (six) hours as needed for wheezing or shortness of breath. 1 Inhaler 0  . aspirin EC 81 MG EC tablet Take 1 tablet (81 mg total) by mouth daily. 30 tablet 3  . bismuth subsalicylate (PEPTO BISMOL) 262 MG/15ML suspension Take 30 mLs by mouth every 6 (six) hours as needed for indigestion.    . docusate sodium (COLACE) 100 MG capsule Take 100 mg by mouth 2 (two) times daily.    Marland Kitchen  furosemide (LASIX) 20 MG tablet Take 1 tablet (20 mg total) by mouth daily. 5 tablet 0  . ibuprofen (ADVIL,MOTRIN) 600 MG tablet Take 600 mg by mouth every 6 (six) hours as needed.    . lidocaine-prilocaine (EMLA) cream Apply to affected area once 30 g 3  . lisinopril (PRINIVIL,ZESTRIL) 5 MG tablet Take 1 tablet (5 mg total) by mouth daily. (Patient taking differently: Take 2.5 mg by mouth daily. ) 30 tablet 0  . metoprolol tartrate (LOPRESSOR) 25 MG tablet Take 0.5 tablets (12.5 mg total) by mouth 2 (two) times daily. 30 tablet 0  .  nitroGLYCERIN (NITROSTAT) 0.4 MG SL tablet Place 1 tablet (0.4 mg total) under the tongue every 5 (five) minutes x 3 doses as needed for chest pain. 30 tablet 0  . omeprazole (PRILOSEC) 20 MG capsule Take 20 mg by mouth.    . ondansetron (ZOFRAN) 8 MG tablet Take 1 tablet (8 mg total) by mouth 2 (two) times daily. Start the day after chemo, then as needed for nausea or vomiting. 30 tablet 1  . oxyCODONE (OXY IR/ROXICODONE) 5 MG immediate release tablet Take 5 mg by mouth every 4 (four) hours as needed for severe pain.    . potassium chloride 20 MEQ TBCR Take 10 mEq by mouth 2 (two) times daily. 10 tablet 0  . prochlorperazine (COMPAZINE) 10 MG tablet Take 1 tablet (10 mg total) by mouth every 6 (six) hours as needed (Nausea or vomiting). 30 tablet 1   Current Facility-Administered Medications  Medication Dose Route Frequency Provider Last Rate Last Dose  . pegfilgrastim (NEULASTA ONPRO KIT) injection 6 mg  6 mg Subcutaneous Once Lloyd Huger, MD       Facility-Administered Medications Ordered in Other Visits  Medication Dose Route Frequency Provider Last Rate Last Dose  . heparin lock flush 100 unit/mL  500 Units Intracatheter Once PRN Lloyd Huger, MD      . sodium chloride 0.9 % injection 10 mL  10 mL Intracatheter PRN Lloyd Huger, MD        OBJECTIVE: Filed Vitals:   04/25/15 0855  BP: 135/92  Pulse: 97  Temp: 98 F (36.7 C)  Resp: 18     Body mass index is 22.15 kg/(m^2).    ECOG FS:1 - Symptomatic but completely ambulatory  General: Well-developed, well-nourished, no acute distress. Eyes: anicteric sclera. Breasts: Exam deferred today. Lungs: Clear to auscultation bilaterally. Heart: Regular rate and rhythm. No rubs, murmurs, or gallops. Abdomen: Soft, nontender, nondistended.  Musculoskeletal: No edema, cyanosis, or clubbing. Neuro: Alert, answering all questions appropriately. Cranial nerves grossly intact. Skin: No rashes or petechiae noted. Psych:  Normal affect.   LAB RESULTS:  Lab Results  Component Value Date   NA 137 04/25/2015   K 3.9 04/25/2015   CL 104 04/25/2015   CO2 25 04/25/2015   GLUCOSE 88 04/25/2015   BUN 7 04/25/2015   CREATININE 0.58 04/25/2015   CALCIUM 8.3* 04/25/2015   PROT 6.5 04/25/2015   ALBUMIN 3.1* 04/25/2015   AST 22 04/25/2015   ALT 25 04/25/2015   ALKPHOS 69 04/25/2015   BILITOT 0.5 04/25/2015   GFRNONAA >60 04/25/2015   GFRAA >60 04/25/2015    Lab Results  Component Value Date   WBC 3.6 04/25/2015   NEUTROABS 1.9 04/25/2015   HGB 10.9* 04/25/2015   HCT 32.7* 04/25/2015   MCV 89.1 04/25/2015   PLT 221 04/25/2015     STUDIES: No results found.  ASSESSMENT: Stage IV  lobular breast cancer with ovarian and abdominal metastasis. ER/PR positive, HER-2/neu not overexpressing. Original primary was in left breast.  PLAN:    1. Breast cancer: Although patient has a lobular subtype, she will still benefit from palliative chemotherapy. Ca 27.29 is only mildly elevated at 78.9. Proceed with cycle 1, day 8 of gemcitabine and Taxotere today.  Return to clinic in 2 weeks for consideration of cycle 2, day 1 which will be gemcitabine only. She will have Neulasta support with ONPRO and day 15 off. Plan to do 3-4 cycles and then reimage.  Once patient completes chemotherapy, will consider reinitiating an aromatase inhibitor. Patient expressed understanding and was in agreement with this plan.  Patient expressed understanding and was in agreement with this plan. She also understands that She can call clinic at any time with any questions, concerns, or complaints.   No matching staging information was found for the patient.  Lloyd Huger, MD   04/25/2015 11:55 AM

## 2015-04-25 NOTE — Progress Notes (Signed)
[place]  [phone]) [phone] Fax:(336) [phone]  ID: [name] OB: 1970/12/28  MR#: [id number]  QQP#:[id number]  Patient Care Team: [facility] as PCP - General  CHIEF COMPLAINT:  Chief Complaint  Patient presents with  . Follow-up    Breast Cancer    INTERVAL HISTORY: Patient returns to clinic today for further evaluation and initiation of cycle 1, day 1 of gemcitabine and Taxotere. She currently feels well. She denies any fevers. She does not complain of any further pain. She denies chest pain or shortness of breath. She denies any nausea, vomiting, constipation, or diarrhea. She has no urinary complaints. Patient offers no further specific complaints.  REVIEW OF SYSTEMS:   Review of Systems  Constitutional: Positive for weight loss and malaise/fatigue. Negative for fever.  Respiratory: Negative.   Cardiovascular: Negative.   Gastrointestinal: Negative.   Neurological: Negative for headaches.    As per HPI. Otherwise, a complete review of systems is negatve.  PAST MEDICAL HISTORY: Past Medical History  Diagnosis Date  . Asthma 2005  . Hypertension 2012  . Personal history of tobacco use, presenting hazards to health   . Myocardial infarction 10/2012  . Cancer 2012    breast cancer, right-treated with neoadjunvant chemotherapy followed by partial mastectomy with sn bx and radiation. Pt is currently taking Tamoxifen since January 2013   . Lump or mass in breast 2012    right  . Malignant neoplasm of upper-outer quadrant of female breast 2012    right  . Breast cancer   . CAD (coronary artery disease)   . MI (myocardial infarction)     PAST SURGICAL HISTORY: Past Surgical History  Procedure Laterality Date  . Portacath placement    . Cesarean section  [id number]  . Tubal ligation  2004  . Breast lumpectomy Right 2012  . Finger surgery Left 1986    reconstruction, left pinky  . Wrist surgery Left 1987  .  Coronary angioplasty with stent placement  20123    Myocardial infarction  . Left heart catheterization with coronary angiogram N/A 10/16/2012    Procedure: LEFT HEART CATHETERIZATION WITH CORONARY ANGIOGRAM;  Surgeon: Clent Demark, MD;  Location: [facility] CATH LAB;  Service: Cardiovascular;  Laterality: N/A;    FAMILY HISTORY Family History  Problem Relation Age of Onset  . Cancer Maternal Grandmother 69    breast       ADVANCED DIRECTIVES:    HEALTH MAINTENANCE: History  Substance Use Topics  . Smoking status: Current Every Day Smoker -- 1.00 packs/day for 15 years    Types: Cigarettes  . Smokeless tobacco: Not on file  . Alcohol Use: No     Colonoscopy:  PAP:  Bone density:  Lipid panel:  Allergies  Allergen Reactions  . Codeine Nausea And Vomiting  . Penicillins Rash    Current Outpatient Prescriptions  Medication Sig Dispense Refill  . albuterol (PROVENTIL HFA;VENTOLIN HFA) 108 (90 BASE) MCG/ACT inhaler Inhale 2 puffs into the lungs every 6 (six) hours as needed for wheezing or shortness of breath. 1 Inhaler 0  . aspirin EC 81 MG EC tablet Take 1 tablet (81 mg total) by mouth daily. 30 tablet 3  . bismuth subsalicylate (PEPTO BISMOL) 262 MG/15ML suspension Take 30 mLs by mouth every 6 (six) hours as needed for indigestion.    . docusate sodium (COLACE) 100 MG capsule Take 100 mg by mouth 2 (two) times daily.    . furosemide (LASIX) 20  MG tablet Take 1 tablet (20 mg total) by mouth daily. 5 tablet 0  . ibuprofen (ADVIL,MOTRIN) 600 MG tablet Take 600 mg by mouth every 6 (six) hours as needed.    . lidocaine-prilocaine (EMLA) cream Apply to affected area once 30 g 3  . lisinopril (PRINIVIL,ZESTRIL) 5 MG tablet Take 1 tablet (5 mg total) by mouth daily. (Patient taking differently: Take 2.5 mg by mouth daily. ) 30 tablet 0  . metoprolol tartrate (LOPRESSOR) 25 MG tablet Take 0.5 tablets (12.5 mg total) by mouth 2 (two) times daily. 30 tablet 0  . nitroGLYCERIN (NITROSTAT)  0.4 MG SL tablet Place 1 tablet (0.4 mg total) under the tongue every 5 (five) minutes x 3 doses as needed for chest pain. 30 tablet 0  . omeprazole (PRILOSEC) 20 MG capsule Take 20 mg by mouth.    . ondansetron (ZOFRAN) 8 MG tablet Take 1 tablet (8 mg total) by mouth 2 (two) times daily. Start the day after chemo, then as needed for nausea or vomiting. 30 tablet 1  . oxyCODONE (OXY IR/ROXICODONE) 5 MG immediate release tablet Take 5 mg by mouth every 4 (four) hours as needed for severe pain.    . potassium chloride 20 MEQ TBCR Take 10 mEq by mouth 2 (two) times daily. 10 tablet 0  . prochlorperazine (COMPAZINE) 10 MG tablet Take 1 tablet (10 mg total) by mouth every 6 (six) hours as needed (Nausea or vomiting). 30 tablet 1   No current facility-administered medications for this visit.   Facility-Administered Medications Ordered in Other Visits  Medication Dose Route Frequency Provider Last Rate Last Dose  . gemcitabine (GEMZAR) 1,520 mg in sodium chloride 0.9 % 100 mL chemo infusion  1,000 mg/m2 (Treatment Plan Actual) Intravenous Once Lloyd Huger, MD 280 mL/hr at 04/25/15 1119 1,520 mg at 04/25/15 1119  . heparin lock flush 100 unit/mL  500 Units Intracatheter Once PRN Lloyd Huger, MD      . pegfilgrastim (NEULASTA ONPRO KIT) injection 6 mg  6 mg Subcutaneous Once Lloyd Huger, MD      . sodium chloride 0.9 % injection 10 mL  10 mL Intracatheter PRN Lloyd Huger, MD        OBJECTIVE: There were no vitals filed for this visit.   Body mass index is 22.23 kg/(m^2).    ECOG FS:1 - Symptomatic but completely ambulatory  General: Well-developed, well-nourished, no acute distress. Eyes: anicteric sclera. Breasts: Exam deferred today. Lungs: Clear to auscultation bilaterally. Heart: Regular rate and rhythm. No rubs, murmurs, or gallops. Abdomen: Soft, nontender, nondistended.  Musculoskeletal: No edema, cyanosis, or clubbing. Neuro: Alert, answering all questions  appropriately. Cranial nerves grossly intact. Skin: No rashes or petechiae noted. Psych: Normal affect.   LAB RESULTS:  Lab Results  Component Value Date   NA 137 04/25/2015   K 3.9 04/25/2015   CL 104 04/25/2015   CO2 25 04/25/2015   GLUCOSE 88 04/25/2015   BUN 7 04/25/2015   CREATININE 0.58 04/25/2015   CALCIUM 8.3* 04/25/2015   PROT 6.5 04/25/2015   ALBUMIN 3.1* 04/25/2015   AST 22 04/25/2015   ALT 25 04/25/2015   ALKPHOS 69 04/25/2015   BILITOT 0.5 04/25/2015   GFRNONAA >60 04/25/2015   GFRAA >60 04/25/2015    Lab Results  Component Value Date   WBC 3.6 04/25/2015   NEUTROABS 1.9 04/25/2015   HGB 10.9* 04/25/2015   HCT 32.7* 04/25/2015   MCV 89.1 04/25/2015   PLT 221  04/25/2015     STUDIES: No results found.  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 ER/PR positive, HER-2/neu not overexpressing. Original primary was in left breast.  PLAN:    1. Breast cancer: Although patient has a lobular subtype, she will still benefit from palliative chemotherapy. Ca 27.29 is only mildly elevated at 78.9. Proceed with cycle 1, day 1 of gemcitabine and Taxotere today. Gemcitabine only. Return to clinic in 1 week for consideration of day 8 which will be gemcitabine plus Taxotere. She will have Neulasta support with day 15 off. Plan to do 3-4 cycles and then reimage.  Once patient completes chemotherapy, will consider reinitiating an aromatase inhibitor. Patient expressed understanding and was in agreement with this plan.  Patient expressed understanding and was in agreement with this plan. She also understands that She can call clinic at any time with any questions, concerns, or complaints.   No matching staging information was found for the patient.  Lloyd Huger, MD   04/25/2015 11:45 AM

## 2015-05-09 ENCOUNTER — Ambulatory Visit (HOSPITAL_BASED_OUTPATIENT_CLINIC_OR_DEPARTMENT_OTHER): Payer: Medicaid Other | Admitting: Oncology

## 2015-05-09 ENCOUNTER — Inpatient Hospital Stay: Payer: Medicaid Other | Attending: Oncology

## 2015-05-09 ENCOUNTER — Ambulatory Visit: Payer: Medicaid Other

## 2015-05-09 DIAGNOSIS — C50911 Malignant neoplasm of unspecified site of right female breast: Secondary | ICD-10-CM | POA: Diagnosis not present

## 2015-05-09 DIAGNOSIS — C7960 Secondary malignant neoplasm of unspecified ovary: Secondary | ICD-10-CM | POA: Insufficient documentation

## 2015-05-09 DIAGNOSIS — Z79899 Other long term (current) drug therapy: Secondary | ICD-10-CM | POA: Insufficient documentation

## 2015-05-09 DIAGNOSIS — C772 Secondary and unspecified malignant neoplasm of intra-abdominal lymph nodes: Secondary | ICD-10-CM | POA: Insufficient documentation

## 2015-05-09 DIAGNOSIS — C50919 Malignant neoplasm of unspecified site of unspecified female breast: Secondary | ICD-10-CM

## 2015-05-09 DIAGNOSIS — C7981 Secondary malignant neoplasm of breast: Secondary | ICD-10-CM

## 2015-05-09 DIAGNOSIS — Z5111 Encounter for antineoplastic chemotherapy: Secondary | ICD-10-CM | POA: Diagnosis present

## 2015-05-09 DIAGNOSIS — C7989 Secondary malignant neoplasm of other specified sites: Secondary | ICD-10-CM

## 2015-05-09 DIAGNOSIS — Z170 Estrogen receptor positive status [ER+]: Secondary | ICD-10-CM | POA: Diagnosis not present

## 2015-05-09 DIAGNOSIS — Z418 Encounter for other procedures for purposes other than remedying health state: Secondary | ICD-10-CM

## 2015-05-09 LAB — CBC WITH DIFFERENTIAL/PLATELET
Basophils Absolute: 0.2 10*3/uL — ABNORMAL HIGH (ref 0–0.1)
Eosinophils Absolute: 0.1 10*3/uL (ref 0–0.7)
Eosinophils Relative: 0 %
HEMATOCRIT: 33.1 % — AB (ref 35.0–47.0)
HEMOGLOBIN: 10.9 g/dL — AB (ref 12.0–16.0)
Lymphs Abs: 2 10*3/uL (ref 1.0–3.6)
MCH: 29.6 pg (ref 26.0–34.0)
MCHC: 33 g/dL (ref 32.0–36.0)
MCV: 89.9 fL (ref 80.0–100.0)
Monocytes Absolute: 1.4 10*3/uL — ABNORMAL HIGH (ref 0.2–0.9)
NEUTROS ABS: 15.3 10*3/uL — AB (ref 1.4–6.5)
Neutrophils Relative %: 81 %
Platelets: 470 10*3/uL — ABNORMAL HIGH (ref 150–440)
RBC: 3.68 MIL/uL — ABNORMAL LOW (ref 3.80–5.20)
RDW: 18.7 % — ABNORMAL HIGH (ref 11.5–14.5)
WBC: 19 10*3/uL — ABNORMAL HIGH (ref 3.6–11.0)

## 2015-05-09 MED ORDER — SODIUM CHLORIDE 0.9 % IJ SOLN
10.0000 mL | INTRAMUSCULAR | Status: DC | PRN
  Administered 2015-05-09: 10 mL
  Filled 2015-05-09: qty 10

## 2015-05-09 MED ORDER — HEPARIN SOD (PORK) LOCK FLUSH 100 UNIT/ML IV SOLN
500.0000 [IU] | Freq: Once | INTRAVENOUS | Status: AC | PRN
  Administered 2015-05-09: 500 [IU]

## 2015-05-09 MED ORDER — SODIUM CHLORIDE 0.9 % IV SOLN
Freq: Once | INTRAVENOUS | Status: AC
  Administered 2015-05-09: 12:00:00 via INTRAVENOUS
  Filled 2015-05-09: qty 1000

## 2015-05-09 MED ORDER — PROCHLORPERAZINE MALEATE 10 MG PO TABS
10.0000 mg | ORAL_TABLET | Freq: Once | ORAL | Status: AC
Start: 2015-05-09 — End: 2015-05-09
  Administered 2015-05-09: 10 mg via ORAL
  Filled 2015-05-09: qty 1

## 2015-05-09 MED ORDER — SODIUM CHLORIDE 0.9 % IV SOLN
1000.0000 mg/m2 | Freq: Once | INTRAVENOUS | Status: AC
  Administered 2015-05-09: 1520 mg via INTRAVENOUS
  Filled 2015-05-09: qty 40

## 2015-05-10 LAB — COMPREHENSIVE METABOLIC PANEL
ALT: 11 U/L — AB (ref 14–54)
ANION GAP: 9 (ref 5–15)
AST: 15 U/L (ref 15–41)
Albumin: 3 g/dL — ABNORMAL LOW (ref 3.5–5.0)
Alkaline Phosphatase: 96 U/L (ref 38–126)
BUN: 6 mg/dL (ref 6–20)
CO2: 25 mmol/L (ref 22–32)
Calcium: 8.1 mg/dL — ABNORMAL LOW (ref 8.9–10.3)
Chloride: 101 mmol/L (ref 101–111)
Creatinine, Ser: 0.57 mg/dL (ref 0.44–1.00)
GFR calc Af Amer: >60 mL/min (ref >60)
GLUCOSE: 102 mg/dL — AB (ref 65–99)
Potassium: 3.5 mmol/L (ref 3.5–5.1)
Sodium: 135 mmol/L (ref 135–145)
TOTAL PROTEIN: 6.3 g/dL — AB (ref 6.5–8.1)
Total Bilirubin: <0.1 mg/dL — ABNORMAL LOW (ref 0.3–1.2)

## 2015-05-16 ENCOUNTER — Inpatient Hospital Stay: Payer: Medicaid Other

## 2015-05-16 ENCOUNTER — Inpatient Hospital Stay (HOSPITAL_BASED_OUTPATIENT_CLINIC_OR_DEPARTMENT_OTHER): Payer: Medicaid Other | Admitting: Oncology

## 2015-05-16 VITALS — BP 122/87 | HR 95 | Temp 97.0°F | Resp 18

## 2015-05-16 VITALS — BP 116/82 | HR 94 | Temp 97.8°F | Resp 16 | Wt 118.6 lb

## 2015-05-16 DIAGNOSIS — C7981 Secondary malignant neoplasm of breast: Secondary | ICD-10-CM

## 2015-05-16 DIAGNOSIS — Z170 Estrogen receptor positive status [ER+]: Secondary | ICD-10-CM

## 2015-05-16 DIAGNOSIS — C50919 Malignant neoplasm of unspecified site of unspecified female breast: Secondary | ICD-10-CM

## 2015-05-16 DIAGNOSIS — C50911 Malignant neoplasm of unspecified site of right female breast: Secondary | ICD-10-CM

## 2015-05-16 DIAGNOSIS — C50912 Malignant neoplasm of unspecified site of left female breast: Secondary | ICD-10-CM

## 2015-05-16 DIAGNOSIS — C772 Secondary and unspecified malignant neoplasm of intra-abdominal lymph nodes: Secondary | ICD-10-CM | POA: Diagnosis not present

## 2015-05-16 DIAGNOSIS — C7960 Secondary malignant neoplasm of unspecified ovary: Secondary | ICD-10-CM

## 2015-05-16 DIAGNOSIS — Z79899 Other long term (current) drug therapy: Secondary | ICD-10-CM

## 2015-05-16 DIAGNOSIS — C7989 Secondary malignant neoplasm of other specified sites: Secondary | ICD-10-CM

## 2015-05-16 DIAGNOSIS — Z5111 Encounter for antineoplastic chemotherapy: Secondary | ICD-10-CM | POA: Diagnosis not present

## 2015-05-16 LAB — CBC WITH DIFFERENTIAL/PLATELET
Basophils Absolute: 0.1 10*3/uL (ref 0–0.1)
Basophils Relative: 3 %
Eosinophils Absolute: 0 10*3/uL (ref 0–0.7)
Eosinophils Relative: 0 %
HCT: 31.3 % — ABNORMAL LOW (ref 35.0–47.0)
Hemoglobin: 10.6 g/dL — ABNORMAL LOW (ref 12.0–16.0)
LYMPHS ABS: 1.1 10*3/uL (ref 1.0–3.6)
LYMPHS PCT: 29 %
MCH: 30.1 pg (ref 26.0–34.0)
MCHC: 33.7 g/dL (ref 32.0–36.0)
MCV: 89.4 fL (ref 80.0–100.0)
Monocytes Absolute: 0.6 10*3/uL (ref 0.2–0.9)
Monocytes Relative: 15 %
Neutro Abs: 2.1 10*3/uL (ref 1.4–6.5)
Neutrophils Relative %: 53 %
Platelets: 401 10*3/uL (ref 150–440)
RBC: 3.51 MIL/uL — AB (ref 3.80–5.20)
RDW: 19.1 % — ABNORMAL HIGH (ref 11.5–14.5)
WBC: 4 10*3/uL (ref 3.6–11.0)

## 2015-05-16 LAB — COMPREHENSIVE METABOLIC PANEL
ALK PHOS: 77 U/L (ref 38–126)
ALT: 45 U/L (ref 14–54)
ANION GAP: 11 (ref 5–15)
AST: 34 U/L (ref 15–41)
Albumin: 3.1 g/dL — ABNORMAL LOW (ref 3.5–5.0)
BILIRUBIN TOTAL: 0.1 mg/dL — AB (ref 0.3–1.2)
BUN: 6 mg/dL (ref 6–20)
CO2: 26 mmol/L (ref 22–32)
Calcium: 8.4 mg/dL — ABNORMAL LOW (ref 8.9–10.3)
Chloride: 100 mmol/L — ABNORMAL LOW (ref 101–111)
Creatinine, Ser: 0.51 mg/dL (ref 0.44–1.00)
GLUCOSE: 102 mg/dL — AB (ref 65–99)
POTASSIUM: 3.7 mmol/L (ref 3.5–5.1)
Sodium: 137 mmol/L (ref 135–145)
Total Protein: 6.2 g/dL — ABNORMAL LOW (ref 6.5–8.1)

## 2015-05-16 MED ORDER — SODIUM CHLORIDE 0.9 % IV SOLN
75.0000 mg/m2 | Freq: Once | INTRAVENOUS | Status: AC
  Administered 2015-05-16: 110 mg via INTRAVENOUS
  Filled 2015-05-16: qty 11

## 2015-05-16 MED ORDER — SODIUM CHLORIDE 0.9 % IV SOLN
1000.0000 mg/m2 | Freq: Once | INTRAVENOUS | Status: AC
  Administered 2015-05-16: 1520 mg via INTRAVENOUS
  Filled 2015-05-16: qty 40

## 2015-05-16 MED ORDER — SODIUM CHLORIDE 0.9 % IV SOLN
Freq: Once | INTRAVENOUS | Status: AC
  Administered 2015-05-16: 10:00:00 via INTRAVENOUS
  Filled 2015-05-16: qty 4

## 2015-05-16 MED ORDER — SODIUM CHLORIDE 0.9 % IJ SOLN
10.0000 mL | INTRAMUSCULAR | Status: DC | PRN
  Administered 2015-05-16: 10 mL
  Filled 2015-05-16: qty 10

## 2015-05-16 MED ORDER — HEPARIN SOD (PORK) LOCK FLUSH 100 UNIT/ML IV SOLN
INTRAVENOUS | Status: AC
  Filled 2015-05-16: qty 5

## 2015-05-16 MED ORDER — SODIUM CHLORIDE 0.9 % IV SOLN
Freq: Once | INTRAVENOUS | Status: AC
  Administered 2015-05-16: 10:00:00 via INTRAVENOUS
  Filled 2015-05-16: qty 1000

## 2015-05-16 MED ORDER — PEGFILGRASTIM 6 MG/0.6ML ~~LOC~~ PSKT
6.0000 mg | PREFILLED_SYRINGE | Freq: Once | SUBCUTANEOUS | Status: AC
  Administered 2015-05-16: 6 mg via SUBCUTANEOUS

## 2015-05-16 MED ORDER — HEPARIN SOD (PORK) LOCK FLUSH 100 UNIT/ML IV SOLN
500.0000 [IU] | Freq: Once | INTRAVENOUS | Status: AC | PRN
  Administered 2015-05-16: 500 [IU]

## 2015-05-20 NOTE — Progress Notes (Signed)
[place]  Telephone:(336) [phone] [phone]) [phone]  ID: [name] OB: Jul 10, 1971  MR#: [id number]  VZD#:[id number]  Patient Care Team: [facility] as PCP - General  CHIEF COMPLAINT:  No chief complaint on file.   INTERVAL HISTORY: Patient returns to clinic today for further evaluation and initiation of cycle 2, day 1 of gemcitabine and Taxotere. She is tolerating her treatments well without significant side effects. She currently feels well. She denies any fevers. She does not complain of any further pain. She denies chest pain or shortness of breath. She denies any nausea, vomiting, constipation, or diarrhea. She has no urinary complaints. Patient offers no further specific complaints.  REVIEW OF SYSTEMS:   Review of Systems  Constitutional: Positive for malaise/fatigue. Negative for fever and weight loss.  Respiratory: Negative.   Cardiovascular: Negative.   Gastrointestinal: Negative.     As per HPI. Otherwise, a complete review of systems is negatve.  PAST MEDICAL HISTORY: Past Medical History  Diagnosis Date  . Asthma 2005  . Hypertension 2012  . Personal history of tobacco use, presenting hazards to health   . Myocardial infarction 10/2012  . Cancer 2012    breast cancer, right-treated with neoadjunvant chemotherapy followed by partial mastectomy with sn bx and radiation. Pt is currently taking Tamoxifen since January 2013   . Lump or mass in breast 2012    right  . Malignant neoplasm of upper-outer quadrant of female breast 2012    right  . Breast cancer   . CAD (coronary artery disease)   . MI (myocardial infarction)     PAST SURGICAL HISTORY: Past Surgical History  Procedure Laterality Date  . Portacath placement    . Cesarean section  [id number]  . Tubal ligation  2004  . Breast lumpectomy Right 2012  . Finger surgery Left 1986    reconstruction, left pinky  . Wrist surgery Left 1987  . Coronary  angioplasty with stent placement  20123    Myocardial infarction  . Left heart catheterization with coronary angiogram N/A 10/16/2012    Procedure: LEFT HEART CATHETERIZATION WITH CORONARY ANGIOGRAM;  Surgeon: Clent Demark, MD;  Location: [facility] CATH LAB;  Service: Cardiovascular;  Laterality: N/A;    FAMILY HISTORY Family History  Problem Relation Age of Onset  . Cancer Maternal Grandmother 69    breast       ADVANCED DIRECTIVES:    HEALTH MAINTENANCE: History  Substance Use Topics  . Smoking status: Current Every Day Smoker -- 1.00 packs/day for 15 years    Types: Cigarettes  . Smokeless tobacco: Not on file  . Alcohol Use: No     Colonoscopy:  PAP:  Bone density:  Lipid panel:  Allergies  Allergen Reactions  . Codeine Nausea And Vomiting  . Penicillins Rash    Current Outpatient Prescriptions  Medication Sig Dispense Refill  . albuterol (PROVENTIL HFA;VENTOLIN HFA) 108 (90 BASE) MCG/ACT inhaler Inhale 2 puffs into the lungs every 6 (six) hours as needed for wheezing or shortness of breath. 1 Inhaler 0  . aspirin EC 81 MG EC tablet Take 1 tablet (81 mg total) by mouth daily. 30 tablet 3  . bismuth subsalicylate (PEPTO BISMOL) 262 MG/15ML suspension Take 30 mLs by mouth every 6 (six) hours as needed for indigestion.    . docusate sodium (COLACE) 100 MG capsule Take 100 mg by mouth 2 (two) times daily.    . furosemide (LASIX) 20 MG tablet Take 1 tablet (  20 mg total) by mouth daily. 5 tablet 0  . ibuprofen (ADVIL,MOTRIN) 600 MG tablet Take 600 mg by mouth every 6 (six) hours as needed.    . lidocaine-prilocaine (EMLA) cream Apply to affected area once 30 g 3  . lisinopril (PRINIVIL,ZESTRIL) 5 MG tablet Take 1 tablet (5 mg total) by mouth daily. (Patient taking differently: Take 2.5 mg by mouth daily. ) 30 tablet 0  . metoprolol tartrate (LOPRESSOR) 25 MG tablet Take 0.5 tablets (12.5 mg total) by mouth 2 (two) times daily. 30 tablet 0  . nitroGLYCERIN (NITROSTAT) 0.4 MG  SL tablet Place 1 tablet (0.4 mg total) under the tongue every 5 (five) minutes x 3 doses as needed for chest pain. 30 tablet 0  . omeprazole (PRILOSEC) 20 MG capsule Take 20 mg by mouth.    . ondansetron (ZOFRAN) 8 MG tablet Take 1 tablet (8 mg total) by mouth 2 (two) times daily. Start the day after chemo, then as needed for nausea or vomiting. 30 tablet 1  . potassium chloride 20 MEQ TBCR Take 10 mEq by mouth 2 (two) times daily. 10 tablet 0  . prochlorperazine (COMPAZINE) 10 MG tablet Take 1 tablet (10 mg total) by mouth every 6 (six) hours as needed (Nausea or vomiting). 30 tablet 1   Current Facility-Administered Medications  Medication Dose Route Frequency Provider Last Rate Last Dose  . sodium chloride 0.9 % injection 10 mL  10 mL Intracatheter PRN Lloyd Huger, MD   10 mL at 05/09/15 1100   Facility-Administered Medications Ordered in Other Visits  Medication Dose Route Frequency Provider Last Rate Last Dose  . sodium chloride 0.9 % injection 10 mL  10 mL Intracatheter PRN Lloyd Huger, MD   10 mL at 04/25/15 1204    OBJECTIVE: There were no vitals filed for this visit.   There is no weight on file to calculate BMI.    ECOG FS:1 - Symptomatic but completely ambulatory  General: Well-developed, well-nourished, no acute distress. Eyes: anicteric sclera. Breasts: Exam deferred today. Lungs: Clear to auscultation bilaterally. Heart: Regular rate and rhythm. No rubs, murmurs, or gallops. Abdomen: Soft, nontender, nondistended.  Musculoskeletal: No edema, cyanosis, or clubbing. Neuro: Alert, answering all questions appropriately. Cranial nerves grossly intact. Skin: No rashes or petechiae noted. Psych: Normal affect.   LAB RESULTS:  Lab Results  Component Value Date   NA 137 05/16/2015   K 3.7 05/16/2015   CL 100* 05/16/2015   CO2 26 05/16/2015   GLUCOSE 102* 05/16/2015   BUN 6 05/16/2015   CREATININE 0.51 05/16/2015   CALCIUM 8.4* 05/16/2015   PROT 6.2*  05/16/2015   ALBUMIN 3.1* 05/16/2015   AST 34 05/16/2015   ALT 45 05/16/2015   ALKPHOS 77 05/16/2015   BILITOT 0.1* 05/16/2015   GFRNONAA >60 05/16/2015   GFRAA >60 05/16/2015    Lab Results  Component Value Date   WBC 4.0 05/16/2015   NEUTROABS 2.1 05/16/2015   HGB 10.6* 05/16/2015   HCT 31.3* 05/16/2015   MCV 89.4 05/16/2015   PLT 401 05/16/2015     STUDIES: No results found.  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 ER/PR positive, HER-2/neu not overexpressing. Original primary was in left breast.  PLAN:    1. Breast cancer: Although patient has a lobular subtype, she will still benefit from palliative chemotherapy. Ca 27.29 is only mildly elevated at 78.9. Proceed with cycle 2, day 1 of gemcitabine and Taxotere today.  Gemcitabine  only.  Return to clinic in 1 week for consideration of cycle 2, day 8. She will have Neulasta support with ONPRO and day 15 off. Plan to do 3-4 cycles and then reimage.  Once patient completes chemotherapy, will consider reinitiating an aromatase inhibitor. Patient expressed understanding and was in agreement with this plan.  Patient expressed understanding and was in agreement with this plan. She also understands that She can call clinic at any time with any questions, concerns, or complaints.   No matching staging information was found for the patient.  Lloyd Huger, MD   05/20/2015 5:19 PM

## 2015-05-30 ENCOUNTER — Inpatient Hospital Stay: Payer: Medicaid Other

## 2015-05-30 ENCOUNTER — Ambulatory Visit (HOSPITAL_BASED_OUTPATIENT_CLINIC_OR_DEPARTMENT_OTHER): Payer: Medicaid Other | Admitting: Oncology

## 2015-05-30 ENCOUNTER — Other Ambulatory Visit: Payer: Self-pay | Admitting: *Deleted

## 2015-05-30 ENCOUNTER — Ambulatory Visit
Admission: RE | Admit: 2015-05-30 | Discharge: 2015-05-30 | Disposition: A | Discharge status: Home / Self Care | Payer: Medicaid Other | Source: Ambulatory Visit | Attending: Oncology | Admitting: Oncology

## 2015-05-30 ENCOUNTER — Telehealth: Payer: Self-pay | Admitting: *Deleted

## 2015-05-30 VITALS — BP 161/93 | HR 90 | Temp 98.1°F | Resp 16

## 2015-05-30 DIAGNOSIS — R609 Edema, unspecified: Secondary | ICD-10-CM | POA: Diagnosis present

## 2015-05-30 DIAGNOSIS — C772 Secondary and unspecified malignant neoplasm of intra-abdominal lymph nodes: Secondary | ICD-10-CM | POA: Diagnosis not present

## 2015-05-30 DIAGNOSIS — Z5111 Encounter for antineoplastic chemotherapy: Secondary | ICD-10-CM | POA: Diagnosis not present

## 2015-05-30 DIAGNOSIS — C50911 Malignant neoplasm of unspecified site of right female breast: Secondary | ICD-10-CM

## 2015-05-30 DIAGNOSIS — C7960 Secondary malignant neoplasm of unspecified ovary: Secondary | ICD-10-CM | POA: Diagnosis not present

## 2015-05-30 DIAGNOSIS — C7989 Secondary malignant neoplasm of other specified sites: Secondary | ICD-10-CM

## 2015-05-30 DIAGNOSIS — C7981 Secondary malignant neoplasm of breast: Secondary | ICD-10-CM | POA: Diagnosis not present

## 2015-05-30 DIAGNOSIS — C50919 Malignant neoplasm of unspecified site of unspecified female breast: Secondary | ICD-10-CM | POA: Insufficient documentation

## 2015-05-30 DIAGNOSIS — Z170 Estrogen receptor positive status [ER+]: Secondary | ICD-10-CM

## 2015-05-30 DIAGNOSIS — Z95828 Presence of other vascular implants and grafts: Secondary | ICD-10-CM

## 2015-05-30 DIAGNOSIS — Z79899 Other long term (current) drug therapy: Secondary | ICD-10-CM

## 2015-05-30 LAB — COMPREHENSIVE METABOLIC PANEL
ALBUMIN: 3 g/dL — AB (ref 3.5–5.0)
ALK PHOS: 111 U/L (ref 38–126)
ALT: 16 U/L (ref 14–54)
ANION GAP: 11 (ref 5–15)
AST: 21 U/L (ref 15–41)
BILIRUBIN TOTAL: 0.2 mg/dL — AB (ref 0.3–1.2)
BUN: 7 mg/dL (ref 6–20)
CO2: 24 mmol/L (ref 22–32)
CREATININE: 0.69 mg/dL (ref 0.44–1.00)
Calcium: 7.9 mg/dL — ABNORMAL LOW (ref 8.9–10.3)
Chloride: 99 mmol/L — ABNORMAL LOW (ref 101–111)
Glucose, Bld: 118 mg/dL — ABNORMAL HIGH (ref 65–99)
Potassium: 3.6 mmol/L (ref 3.5–5.1)
Sodium: 134 mmol/L — ABNORMAL LOW (ref 135–145)
Total Protein: 6 g/dL — ABNORMAL LOW (ref 6.5–8.1)

## 2015-05-30 LAB — CBC WITH DIFFERENTIAL/PLATELET
BAND NEUTROPHILS: 1 % (ref 0–10)
BLASTS: 0 %
Basophils Absolute: 0 10*3/uL (ref 0.0–0.1)
Basophils Relative: 0 % (ref 0–1)
EOS ABS: 0.2 10*3/uL (ref 0.0–0.7)
EOS PCT: 1 % (ref 0–5)
HEMATOCRIT: 32.3 % — AB (ref 35.0–47.0)
HEMOGLOBIN: 10.7 g/dL — AB (ref 12.0–16.0)
LYMPHS ABS: 2.4 10*3/uL (ref 0.7–4.0)
Lymphocytes Relative: 10 % — ABNORMAL LOW (ref 12–46)
MCH: 30.7 pg (ref 26.0–34.0)
MCHC: 33 g/dL (ref 32.0–36.0)
MCV: 92.9 fL (ref 80.0–100.0)
MONO ABS: 1.9 10*3/uL — AB (ref 0.1–1.0)
MYELOCYTES: 0 %
Metamyelocytes Relative: 0 %
Monocytes Relative: 8 % (ref 3–12)
Neutro Abs: 19.4 10*3/uL — ABNORMAL HIGH (ref 1.7–7.7)
Neutrophils Relative %: 80 % — ABNORMAL HIGH (ref 43–77)
Platelets: 404 10*3/uL (ref 150–440)
Promyelocytes Absolute: 0 %
RBC: 3.47 MIL/uL — ABNORMAL LOW (ref 3.80–5.20)
RDW: 25.3 % — ABNORMAL HIGH (ref 11.5–14.5)
WBC: 23.9 10*3/uL — AB (ref 3.6–11.0)
nRBC: 0 /100 WBC

## 2015-05-30 MED ORDER — SODIUM CHLORIDE 0.9 % IJ SOLN
10.0000 mL | INTRAMUSCULAR | Status: DC | PRN
  Administered 2015-05-30: 10 mL via INTRAVENOUS
  Filled 2015-05-30: qty 10

## 2015-05-30 MED ORDER — SODIUM CHLORIDE 0.9 % IV SOLN
Freq: Once | INTRAVENOUS | Status: AC
  Administered 2015-05-30: 10:00:00 via INTRAVENOUS
  Filled 2015-05-30: qty 1000

## 2015-05-30 MED ORDER — SODIUM CHLORIDE 0.9 % IV SOLN
1000.0000 mg/m2 | Freq: Once | INTRAVENOUS | Status: AC
  Administered 2015-05-30: 1520 mg via INTRAVENOUS
  Filled 2015-05-30: qty 40

## 2015-05-30 MED ORDER — HEPARIN SOD (PORK) LOCK FLUSH 100 UNIT/ML IV SOLN
500.0000 [IU] | Freq: Once | INTRAVENOUS | Status: AC
  Administered 2015-05-30: 500 [IU] via INTRAVENOUS
  Filled 2015-05-30: qty 5

## 2015-05-30 MED ORDER — PROCHLORPERAZINE MALEATE 10 MG PO TABS
10.0000 mg | ORAL_TABLET | Freq: Once | ORAL | Status: AC
  Administered 2015-05-30: 10 mg via ORAL
  Filled 2015-05-30: qty 1

## 2015-05-30 NOTE — Progress Notes (Signed)
Patient is concerned about new left arm pain/swelling that has gotten worse over the past week.

## 2015-05-30 NOTE — Progress Notes (Signed)
[place]  [phone]) [phone] Fax:(336) [phone]  ID: [name] OB: [birth year]  MR#: [id number]  CSN#:[id number]  Patient Care Team: [facility] as PCP - General  CHIEF COMPLAINT:  Chief Complaint  Patient presents with  . Follow-up    breast cancer    INTERVAL HISTORY: Patient returns to clinic today for further evaluation and consideration of cycle 2, day 8 of gemcitabine and Taxotere. She is tolerating her treatments well without significant side effects. She currently feels well. She denies any fevers. She does not complain of any further pain. She denies chest pain or shortness of breath. She denies any nausea, vomiting, constipation, or diarrhea. She has no urinary complaints. Patient offers no further specific complaints.  REVIEW OF SYSTEMS:   Review of Systems  Constitutional: Positive for malaise/fatigue. Negative for fever and weight loss.  Respiratory: Negative.   Cardiovascular: Negative.   Gastrointestinal: Negative.     As per HPI. Otherwise, a complete review of systems is negatve.  PAST MEDICAL HISTORY: Past Medical History  Diagnosis Date  . Asthma 2005  . Hypertension 2012  . Personal history of tobacco use, presenting hazards to health   . Myocardial infarction 10/2012  . Cancer 2012    breast cancer, right-treated with neoadjunvant chemotherapy followed by partial mastectomy with sn bx and radiation. Pt is currently taking Tamoxifen since January 2013   . Lump or mass in breast 2012    right  . Malignant neoplasm of upper-outer quadrant of female breast 2012    right  . Breast cancer   . CAD (coronary artery disease)   . MI (myocardial infarction)     PAST SURGICAL HISTORY: Past Surgical History  Procedure Laterality Date  . Portacath placement    . Cesarean section  [id number]  . Tubal ligation  2004  . Breast lumpectomy Right 2012  . Finger surgery Left 1986    reconstruction, left pinky   . Wrist surgery Left 1987  . Coronary angioplasty with stent placement  20123    Myocardial infarction  . Left heart catheterization with coronary angiogram N/A 10/16/2012    Procedure: LEFT HEART CATHETERIZATION WITH CORONARY ANGIOGRAM;  Surgeon: Clent Demark, MD;  Location: [facility] CATH LAB;  Service: Cardiovascular;  Laterality: N/A;    FAMILY HISTORY Family History  Problem Relation Age of Onset  . Cancer Maternal Grandmother 69    breast       ADVANCED DIRECTIVES:    HEALTH MAINTENANCE: History  Substance Use Topics  . Smoking status: Current Every Day Smoker -- 1.00 packs/day for 15 years    Types: Cigarettes  . Smokeless tobacco: Not on file  . Alcohol Use: No     Colonoscopy:  PAP:  Bone density:  Lipid panel:  Allergies  Allergen Reactions  . Codeine Nausea And Vomiting  . Penicillins Rash    Current Outpatient Prescriptions  Medication Sig Dispense Refill  . albuterol (PROVENTIL HFA;VENTOLIN HFA) 108 (90 BASE) MCG/ACT inhaler Inhale 2 puffs into the lungs every 6 (six) hours as needed for wheezing or shortness of breath. 1 Inhaler 0  . aspirin EC 81 MG EC tablet Take 1 tablet (81 mg total) by mouth daily. 30 tablet 3  . bismuth subsalicylate (PEPTO BISMOL) 262 MG/15ML suspension Take 30 mLs by mouth every 6 (six) hours as needed for indigestion.    . docusate sodium (COLACE) 100 MG capsule Take 100 mg by mouth 2 (two) times daily.    Marland Kitchen  furosemide (LASIX) 20 MG tablet Take 1 tablet (20 mg total) by mouth daily. 5 tablet 0  . ibuprofen (ADVIL,MOTRIN) 600 MG tablet Take 600 mg by mouth every 6 (six) hours as needed.    . lidocaine-prilocaine (EMLA) cream Apply to affected area once 30 g 3  . lisinopril (PRINIVIL,ZESTRIL) 5 MG tablet Take 1 tablet (5 mg total) by mouth daily. (Patient taking differently: Take 2.5 mg by mouth daily. ) 30 tablet 0  . metoprolol tartrate (LOPRESSOR) 25 MG tablet Take 0.5 tablets (12.5 mg total) by mouth 2 (two) times daily. 30 tablet  0  . nitroGLYCERIN (NITROSTAT) 0.4 MG SL tablet Place 1 tablet (0.4 mg total) under the tongue every 5 (five) minutes x 3 doses as needed for chest pain. 30 tablet 0  . omeprazole (PRILOSEC) 20 MG capsule Take 20 mg by mouth.    . ondansetron (ZOFRAN) 8 MG tablet Take 1 tablet (8 mg total) by mouth 2 (two) times daily. Start the day after chemo, then as needed for nausea or vomiting. 30 tablet 1  . potassium chloride 20 MEQ TBCR Take 10 mEq by mouth 2 (two) times daily. 10 tablet 0  . prochlorperazine (COMPAZINE) 10 MG tablet Take 1 tablet (10 mg total) by mouth every 6 (six) hours as needed (Nausea or vomiting). 30 tablet 1   No current facility-administered medications for this visit.   Facility-Administered Medications Ordered in Other Visits  Medication Dose Route Frequency Provider Last Rate Last Dose  . 0.9 %  sodium chloride infusion   Intravenous Once Timothy J Finnegan, MD      . gemcitabine (GEMZAR) 1,520 mg in sodium chloride 0.9 % 100 mL chemo infusion  1,000 mg/m2 (Treatment Plan Actual) Intravenous Once Timothy J Finnegan, MD      . heparin lock flush 100 unit/mL  500 Units Intravenous Once Timothy J Finnegan, MD      . prochlorperazine (COMPAZINE) tablet 10 mg  10 mg Oral Once Timothy J Finnegan, MD      . sodium chloride 0.9 % injection 10 mL  10 mL Intracatheter PRN Timothy J Finnegan, MD   10 mL at 04/25/15 1204  . sodium chloride 0.9 % injection 10 mL  10 mL Intravenous PRN Timothy J Finnegan, MD   10 mL at 05/30/15 0856    OBJECTIVE: Filed Vitals:   05/16/15 0921  BP: 116/82  Pulse: 94  Temp: 97.8 F (36.6 C)  Resp: 16     Body mass index is 21.83 kg/(m^2).    ECOG FS:1 - Symptomatic but completely ambulatory  General: Well-developed, well-nourished, no acute distress. Eyes: anicteric sclera. Breasts: Exam deferred today. Lungs: Clear to auscultation bilaterally. Heart: Regular rate and rhythm. No rubs, murmurs, or gallops. Abdomen: Soft, nontender, nondistended.   Musculoskeletal: No edema, cyanosis, or clubbing. Neuro: Alert, answering all questions appropriately. Cranial nerves grossly intact. Skin: No rashes or petechiae noted. Psych: Normal affect.   LAB RESULTS:  Lab Results  Component Value Date   NA 134* 05/30/2015   K 3.6 05/30/2015   CL 99* 05/30/2015   CO2 24 05/30/2015   GLUCOSE 118* 05/30/2015   BUN 7 05/30/2015   CREATININE 0.69 05/30/2015   CALCIUM 7.9* 05/30/2015   PROT 6.0* 05/30/2015   ALBUMIN 3.0* 05/30/2015   AST 21 05/30/2015   ALT 16 05/30/2015   ALKPHOS 111 05/30/2015   BILITOT 0.2* 05/30/2015   GFRNONAA >60 05/30/2015   GFRAA >60 05/30/2015    Lab Results  Component   Value Date   WBC 23.9* 05/30/2015   NEUTROABS 19.4* 05/30/2015   HGB 10.7* 05/30/2015   HCT 32.3* 05/30/2015   MCV 92.9 05/30/2015   PLT 404 05/30/2015     STUDIES: No results found.  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 ER/PR positive, HER-2/neu not overexpressing. Original primary was in left breast.  PLAN:    1. Breast cancer: Although patient has a lobular subtype, she will still benefit from palliative chemotherapy. Ca 27.29 is only mildly elevated at 78.9. Proceed with cycle 2, day 8 of gemcitabine and Taxotere today.  Return to clinic in 2 weeks for consideration of cycle 3, day 1. She will have Neulasta support with ONPRO on day 8 with day 15 off. Plan to do 3-4 cycles and then reimage.  Once patient completes chemotherapy, will consider reinitiating an aromatase inhibitor. Patient expressed understanding and was in agreement with this plan.  Patient expressed understanding and was in agreement with this plan. She also understands that She can call clinic at any time with any questions, concerns, or complaints.   No matching staging information was found for the patient.  Lloyd Huger, MD   05/30/2015 9:58 AM

## 2015-05-30 NOTE — Telephone Encounter (Signed)
Called with stat results - Left arm and left leg venous doppler negative for DVT.  MD notified.

## 2015-05-30 NOTE — Progress Notes (Signed)
[place]  Telephone:(336) [phone] Fax:(336) [phone]  ID: [name] OB: 12/16/1970  MR#: [id number]  NIO#:[id number]  Patient Care Team: [facility] as PCP - General  CHIEF COMPLAINT:  No chief complaint on file.   INTERVAL HISTORY: Patient returns to clinic today for further evaluation and consideration of cycle 3, day 1 of gemcitabine and Taxotere. Gemcitabine only today. She has had increased swelling and pain of her left upper extremity. She also has increased swelling of her left lower extremity. She is otherwise tolerating her treatments well. She currently feels well. She denies any fevers. She does not complain of any further pain. She denies chest pain or shortness of breath. She denies any nausea, vomiting, constipation, or diarrhea. She has no urinary complaints. Patient offers no further specific complaints.  REVIEW OF SYSTEMS:   Review of Systems  Constitutional: Positive for malaise/fatigue. Negative for fever and weight loss.  Respiratory: Negative.   Cardiovascular: Negative.   Gastrointestinal: Negative.     As per HPI. Otherwise, a complete review of systems is negatve.  PAST MEDICAL HISTORY: Past Medical History  Diagnosis Date  . Asthma 2005  . Hypertension 2012  . Personal history of tobacco use, presenting hazards to health   . Myocardial infarction 10/2012  . Cancer 2012    breast cancer, right-treated with neoadjunvant chemotherapy followed by partial mastectomy with sn bx and radiation. Pt is currently taking Tamoxifen since January 2013   . Lump or mass in breast 2012    right  . Malignant neoplasm of upper-outer quadrant of female breast 2012    right  . Breast cancer   . CAD (coronary artery disease)   . MI (myocardial infarction)     PAST SURGICAL HISTORY: Past Surgical History  Procedure Laterality Date  . Portacath placement    . Cesarean section  [id number]  . Tubal ligation  2004  .  Breast lumpectomy Right 2012  . Finger surgery Left 1986    reconstruction, left pinky  . Wrist surgery Left 1987  . Coronary angioplasty with stent placement  20123    Myocardial infarction  . Left heart catheterization with coronary angiogram N/A 10/16/2012    Procedure: LEFT HEART CATHETERIZATION WITH CORONARY ANGIOGRAM;  Surgeon: Clent Demark, MD;  Location: [facility] CATH LAB;  Service: Cardiovascular;  Laterality: N/A;    FAMILY HISTORY Family History  Problem Relation Age of Onset  . Cancer Maternal Grandmother 69    breast       ADVANCED DIRECTIVES:    HEALTH MAINTENANCE: History  Substance Use Topics  . Smoking status: Current Every Day Smoker -- 1.00 packs/day for 15 years    Types: Cigarettes  . Smokeless tobacco: Not on file  . Alcohol Use: No     Colonoscopy:  PAP:  Bone density:  Lipid panel:  Allergies  Allergen Reactions  . Codeine Nausea And Vomiting  . Penicillins Rash    Current Outpatient Prescriptions  Medication Sig Dispense Refill  . albuterol (PROVENTIL HFA;VENTOLIN HFA) 108 (90 BASE) MCG/ACT inhaler Inhale 2 puffs into the lungs every 6 (six) hours as needed for wheezing or shortness of breath. 1 Inhaler 0  . aspirin EC 81 MG EC tablet Take 1 tablet (81 mg total) by mouth daily. 30 tablet 3  . bismuth subsalicylate (PEPTO BISMOL) 262 MG/15ML suspension Take 30 mLs by mouth every 6 (six) hours as needed for indigestion.    . docusate sodium (COLACE) 100 MG  capsule Take 100 mg by mouth 2 (two) times daily.    . furosemide (LASIX) 20 MG tablet Take 1 tablet (20 mg total) by mouth daily. 5 tablet 0  . ibuprofen (ADVIL,MOTRIN) 600 MG tablet Take 600 mg by mouth every 6 (six) hours as needed.    . lidocaine-prilocaine (EMLA) cream Apply to affected area once 30 g 3  . lisinopril (PRINIVIL,ZESTRIL) 5 MG tablet Take 1 tablet (5 mg total) by mouth daily. (Patient taking differently: Take 2.5 mg by mouth daily. ) 30 tablet 0  . metoprolol tartrate  (LOPRESSOR) 25 MG tablet Take 0.5 tablets (12.5 mg total) by mouth 2 (two) times daily. 30 tablet 0  . nitroGLYCERIN (NITROSTAT) 0.4 MG SL tablet Place 1 tablet (0.4 mg total) under the tongue every 5 (five) minutes x 3 doses as needed for chest pain. 30 tablet 0  . omeprazole (PRILOSEC) 20 MG capsule Take 20 mg by mouth.    . ondansetron (ZOFRAN) 8 MG tablet Take 1 tablet (8 mg total) by mouth 2 (two) times daily. Start the day after chemo, then as needed for nausea or vomiting. 30 tablet 1  . potassium chloride 20 MEQ TBCR Take 10 mEq by mouth 2 (two) times daily. 10 tablet 0  . prochlorperazine (COMPAZINE) 10 MG tablet Take 1 tablet (10 mg total) by mouth every 6 (six) hours as needed (Nausea or vomiting). 30 tablet 1   No current facility-administered medications for this visit.   Facility-Administered Medications Ordered in Other Visits  Medication Dose Route Frequency Provider Last Rate Last Dose  . 0.9 %  sodium chloride infusion   Intravenous Once Lloyd Huger, MD      . gemcitabine (GEMZAR) 1,520 mg in sodium chloride 0.9 % 100 mL chemo infusion  1,000 mg/m2 (Treatment Plan Actual) Intravenous Once Lloyd Huger, MD      . heparin lock flush 100 unit/mL  500 Units Intravenous Once Lloyd Huger, MD      . prochlorperazine (COMPAZINE) tablet 10 mg  10 mg Oral Once Lloyd Huger, MD      . sodium chloride 0.9 % injection 10 mL  10 mL Intracatheter PRN Lloyd Huger, MD   10 mL at 04/25/15 1204  . sodium chloride 0.9 % injection 10 mL  10 mL Intravenous PRN Lloyd Huger, MD   10 mL at 05/30/15 0856    OBJECTIVE: There were no vitals filed for this visit.   There is no weight on file to calculate BMI.    ECOG FS:1 - Symptomatic but completely ambulatory  General: Well-developed, well-nourished, no acute distress. Eyes: anicteric sclera. Breasts: Exam deferred today. Lungs: Clear to auscultation bilaterally. Heart: Regular rate and rhythm. No rubs,  murmurs, or gallops. Abdomen: Soft, nontender, nondistended.  Musculoskeletal: Increased swelling of both left upper and lower extremity. Neuro: Alert, answering all questions appropriately. Cranial nerves grossly intact. Skin: No rashes or petechiae noted. Psych: Normal affect.   LAB RESULTS:  Lab Results  Component Value Date   NA 134* 05/30/2015   K 3.6 05/30/2015   CL 99* 05/30/2015   CO2 24 05/30/2015   GLUCOSE 118* 05/30/2015   BUN 7 05/30/2015   CREATININE 0.69 05/30/2015   CALCIUM 7.9* 05/30/2015   PROT 6.0* 05/30/2015   ALBUMIN 3.0* 05/30/2015   AST 21 05/30/2015   ALT 16 05/30/2015   ALKPHOS 111 05/30/2015   BILITOT 0.2* 05/30/2015   GFRNONAA >60 05/30/2015   GFRAA >60 05/30/2015  Lab Results  Component Value Date   WBC 23.9* 05/30/2015   NEUTROABS 19.4* 05/30/2015   HGB 10.7* 05/30/2015   HCT 32.3* 05/30/2015   MCV 92.9 05/30/2015   PLT 404 05/30/2015     STUDIES: No results found.  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 ER/PR positive, HER-2/neu not overexpressing. Original primary was in left breast.  PLAN:    1. Breast cancer: Although patient has a lobular subtype, she will still benefit from palliative chemotherapy. Ca 27.29 is only mildly elevated at 78.9. Proceed with cycle 3, day 1 of gemcitabine and Taxotere today.  Return to clinic in 1 week for consideration of cycle 3, day 8. She will have Neulasta support with ONPRO on day 8 with day 15 off. Plan to do 3-4 cycles and then reimage.  Once patient completes chemotherapy, will consider reinitiating an aromatase inhibitor. 2. Left upper and lower extremity swelling: Suspicious for DVT. We will get ultrasound to further evaluate. 3. Pain: Continue current narcotic regimen. 4. Leukocytosis: Likely secondary to Neulasta. 5. Anemia: Secondary to chemotherapy, monitor.   Patient expressed understanding and was in agreement with this plan. She also  understands that She can call clinic at any time with any questions, concerns, or complaints.   No matching staging information was found for the patient.  Lloyd Huger, MD   05/30/2015 10:01 AM

## 2015-06-02 ENCOUNTER — Telehealth: Payer: Self-pay | Admitting: Oncology

## 2015-06-04 ENCOUNTER — Other Ambulatory Visit: Payer: Self-pay | Admitting: *Deleted

## 2015-06-04 DIAGNOSIS — C50419 Malignant neoplasm of upper-outer quadrant of unspecified female breast: Secondary | ICD-10-CM

## 2015-06-04 NOTE — Telephone Encounter (Signed)
Patient is under the care of Dr. Grayland Ormond. Will forward to his team.

## 2015-06-04 NOTE — Telephone Encounter (Signed)
Per Dr Grayland Ormond, Order PE Protocol CT Chest, [name] will put order in computer. Patient informed to expect a call

## 2015-06-04 NOTE — Telephone Encounter (Signed)
Forwarded to Dr. Finnegan.  

## 2015-06-04 NOTE — Telephone Encounter (Signed)
Order for PE protocol CT Chest entered, schedule message sent to schedule patient for scan prior to 7/29 appt.

## 2015-06-05 ENCOUNTER — Ambulatory Visit
Admission: RE | Admit: 2015-06-05 | Discharge: 2015-06-05 | Disposition: A | Discharge status: Home / Self Care | Payer: Medicaid Other | Source: Ambulatory Visit | Attending: Oncology | Admitting: Oncology

## 2015-06-05 ENCOUNTER — Ambulatory Visit: Admission: RE | Admit: 2015-06-05 | Payer: Medicaid Other | Source: Ambulatory Visit

## 2015-06-05 ENCOUNTER — Other Ambulatory Visit: Payer: Self-pay | Admitting: Oncology

## 2015-06-05 DIAGNOSIS — I77810 Thoracic aortic ectasia: Secondary | ICD-10-CM | POA: Insufficient documentation

## 2015-06-05 DIAGNOSIS — I82622 Acute embolism and thrombosis of deep veins of left upper extremity: Secondary | ICD-10-CM

## 2015-06-05 DIAGNOSIS — J9811 Atelectasis: Secondary | ICD-10-CM | POA: Diagnosis not present

## 2015-06-05 DIAGNOSIS — C50419 Malignant neoplasm of upper-outer quadrant of unspecified female breast: Secondary | ICD-10-CM | POA: Diagnosis not present

## 2015-06-05 MED ORDER — IOHEXOL 350 MG/ML SOLN
75.0000 mL | Freq: Once | INTRAVENOUS | Status: AC | PRN
  Administered 2015-06-05: 75 mL via INTRAVENOUS

## 2015-06-05 NOTE — Progress Notes (Signed)
BANEEN WIESELER    [id number]   1971/11/02  Type of procedure: CTA Chest for PE  06/05/2015  During your examination at [facility] some of the x-ray dye leaked into the tissues around the vein that the x-ray dye was given through.  What should you do?  1. Apply ice 20 minutes four times a day for three days.  2. Keep the affected extremity elevated above the rest of your body for 48 hours.  3. If you develop any of the following signs or symptoms, please contact Radiology at [phone].  Increased pain  Increasing swelling   Change in sensation (ex. Numbness, tingling)  Development of redness or increase in warmth around the affected area  Increasing hardness  Blistering   I have read and understand these instructions.  Any questions I raised have been discussed to my satisfaction.  I understand that failure to follow these instructions may result in additional complications.

## 2015-06-05 NOTE — Progress Notes (Signed)
This patient has received 75 ml's of IV Omnipaque 350. Contrast extravasation into Left shoulder during a CTA Chest for PE.  The exam was performed on 06/05/2015  Site / affected area assessed by Dr. Kathreen Devoid.

## 2015-06-06 ENCOUNTER — Inpatient Hospital Stay (HOSPITAL_BASED_OUTPATIENT_CLINIC_OR_DEPARTMENT_OTHER): Payer: Medicaid Other | Admitting: Oncology

## 2015-06-06 ENCOUNTER — Inpatient Hospital Stay: Payer: Medicaid Other

## 2015-06-06 VITALS — BP 135/80 | HR 112 | Temp 98.0°F | Resp 18

## 2015-06-06 DIAGNOSIS — C772 Secondary and unspecified malignant neoplasm of intra-abdominal lymph nodes: Secondary | ICD-10-CM | POA: Diagnosis not present

## 2015-06-06 DIAGNOSIS — C50912 Malignant neoplasm of unspecified site of left female breast: Secondary | ICD-10-CM

## 2015-06-06 DIAGNOSIS — C50911 Malignant neoplasm of unspecified site of right female breast: Secondary | ICD-10-CM | POA: Diagnosis not present

## 2015-06-06 DIAGNOSIS — C50919 Malignant neoplasm of unspecified site of unspecified female breast: Secondary | ICD-10-CM

## 2015-06-06 DIAGNOSIS — Z79899 Other long term (current) drug therapy: Secondary | ICD-10-CM

## 2015-06-06 DIAGNOSIS — C7981 Secondary malignant neoplasm of breast: Secondary | ICD-10-CM

## 2015-06-06 DIAGNOSIS — Z170 Estrogen receptor positive status [ER+]: Secondary | ICD-10-CM

## 2015-06-06 DIAGNOSIS — C7960 Secondary malignant neoplasm of unspecified ovary: Secondary | ICD-10-CM

## 2015-06-06 DIAGNOSIS — Z418 Encounter for other procedures for purposes other than remedying health state: Secondary | ICD-10-CM

## 2015-06-06 DIAGNOSIS — Z5111 Encounter for antineoplastic chemotherapy: Secondary | ICD-10-CM | POA: Diagnosis not present

## 2015-06-06 DIAGNOSIS — C7989 Secondary malignant neoplasm of other specified sites: Secondary | ICD-10-CM

## 2015-06-06 LAB — CBC WITH DIFFERENTIAL/PLATELET
BASOS ABS: 0.1 10*3/uL (ref 0–0.1)
Basophils Relative: 2 %
EOS ABS: 0 10*3/uL (ref 0–0.7)
Eosinophils Relative: 1 %
HEMATOCRIT: 26.8 % — AB (ref 35.0–47.0)
HEMOGLOBIN: 9.2 g/dL — AB (ref 12.0–16.0)
Lymphocytes Relative: 22 %
Lymphs Abs: 0.9 10*3/uL — ABNORMAL LOW (ref 1.0–3.6)
MCH: 31.9 pg (ref 26.0–34.0)
MCHC: 34.2 g/dL (ref 32.0–36.0)
MCV: 93.3 fL (ref 80.0–100.0)
MONO ABS: 1.2 10*3/uL — AB (ref 0.2–0.9)
Monocytes Relative: 29 %
Neutro Abs: 1.9 10*3/uL (ref 1.4–6.5)
Neutrophils Relative %: 46 %
PLATELETS: 272 10*3/uL (ref 150–440)
RBC: 2.87 MIL/uL — ABNORMAL LOW (ref 3.80–5.20)
RDW: 24.6 % — ABNORMAL HIGH (ref 11.5–14.5)
WBC: 4 10*3/uL (ref 3.6–11.0)

## 2015-06-06 LAB — COMPREHENSIVE METABOLIC PANEL
ALK PHOS: 75 U/L (ref 38–126)
ALT: 24 U/L (ref 14–54)
ANION GAP: 9 (ref 5–15)
AST: 21 U/L (ref 15–41)
Albumin: 2.9 g/dL — ABNORMAL LOW (ref 3.5–5.0)
BILIRUBIN TOTAL: 0.4 mg/dL (ref 0.3–1.2)
BUN: 7 mg/dL (ref 6–20)
CHLORIDE: 99 mmol/L — AB (ref 101–111)
CO2: 26 mmol/L (ref 22–32)
Calcium: 8.2 mg/dL — ABNORMAL LOW (ref 8.9–10.3)
Creatinine, Ser: 0.57 mg/dL (ref 0.44–1.00)
GLUCOSE: 114 mg/dL — AB (ref 65–99)
Potassium: 3.1 mmol/L — ABNORMAL LOW (ref 3.5–5.1)
Sodium: 134 mmol/L — ABNORMAL LOW (ref 135–145)
Total Protein: 5.8 g/dL — ABNORMAL LOW (ref 6.5–8.1)

## 2015-06-06 MED ORDER — PEGFILGRASTIM 6 MG/0.6ML ~~LOC~~ PSKT
6.0000 mg | PREFILLED_SYRINGE | Freq: Once | SUBCUTANEOUS | Status: AC
  Administered 2015-06-06: 6 mg via SUBCUTANEOUS
  Filled 2015-06-06: qty 0.6

## 2015-06-06 MED ORDER — SODIUM CHLORIDE 0.9 % IJ SOLN
10.0000 mL | INTRAMUSCULAR | Status: DC | PRN
  Administered 2015-06-06: 10 mL via INTRAVENOUS
  Filled 2015-06-06: qty 10

## 2015-06-06 MED ORDER — DOCETAXEL CHEMO INJECTION 160 MG/16ML
75.0000 mg/m2 | Freq: Once | INTRAVENOUS | Status: AC
  Administered 2015-06-06: 110 mg via INTRAVENOUS
  Filled 2015-06-06: qty 11

## 2015-06-06 MED ORDER — HEPARIN SOD (PORK) LOCK FLUSH 100 UNIT/ML IV SOLN
500.0000 [IU] | Freq: Once | INTRAVENOUS | Status: AC
  Administered 2015-06-06: 500 [IU] via INTRAVENOUS
  Filled 2015-06-06: qty 5

## 2015-06-06 MED ORDER — SODIUM CHLORIDE 0.9 % IV SOLN
Freq: Once | INTRAVENOUS | Status: AC
  Administered 2015-06-06: 11:00:00 via INTRAVENOUS
  Filled 2015-06-06: qty 1000

## 2015-06-06 MED ORDER — SODIUM CHLORIDE 0.9 % IV SOLN
1000.0000 mg/m2 | Freq: Once | INTRAVENOUS | Status: AC
  Administered 2015-06-06: 1520 mg via INTRAVENOUS
  Filled 2015-06-06: qty 40

## 2015-06-06 MED ORDER — SODIUM CHLORIDE 0.9 % IV SOLN
Freq: Once | INTRAVENOUS | Status: AC
  Administered 2015-06-06: 11:00:00 via INTRAVENOUS
  Filled 2015-06-06: qty 4

## 2015-06-09 ENCOUNTER — Telehealth: Payer: Self-pay | Admitting: Oncology

## 2015-06-09 NOTE — Telephone Encounter (Signed)
They wanted to know whether or not she has been set up yet with the [facility] clinic. I did not see anything in the notes for schedulers and so I think this is something the nurse must be working on. Please call patient when this is set up. Thanks.

## 2015-06-10 NOTE — Telephone Encounter (Signed)
Orders faxed to lymphedema clinic, they should contact patient with appointment.

## 2015-06-12 NOTE — Progress Notes (Signed)
[place]  [phone]) [phone] [phone]  ID: [name] OB: [birth year]  MR#: [id number]  TDD#:[id number]  Patient Care Team: [facility] as PCP - General  CHIEF COMPLAINT:  No chief complaint on file.   INTERVAL HISTORY: Patient returns to clinic today for further evaluation and consideration of cycle 3, day 8 of gemcitabine and Taxotere. She continues to have increased swelling and pain of her left upper extremity. She also has increased swelling of her left lower extremity. She is otherwise tolerating her treatments well. She currently feels well. She denies any fevers. She does not complain of any further pain. She denies chest pain or shortness of breath. She denies any nausea, vomiting, constipation, or diarrhea. She has no urinary complaints. Patient offers no further specific complaints.  REVIEW OF SYSTEMS:   Review of Systems  Constitutional: Positive for malaise/fatigue. Negative for fever and weight loss.  Respiratory: Negative.   Cardiovascular: Negative.   Gastrointestinal: Negative.     As per HPI. Otherwise, a complete review of systems is negatve.  PAST MEDICAL HISTORY: Past Medical History  Diagnosis Date  . Asthma 2005  . Hypertension 2012  . Personal history of tobacco use, presenting hazards to health   . Myocardial infarction 10/2012  . Cancer 2012    breast cancer, right-treated with neoadjunvant chemotherapy followed by partial mastectomy with sn bx and radiation. Pt is currently taking Tamoxifen since January 2013   . Lump or mass in breast 2012    right  . Malignant neoplasm of upper-outer quadrant of female breast 2012    right  . Breast cancer   . CAD (coronary artery disease)   . MI (myocardial infarction)     PAST SURGICAL HISTORY: Past Surgical History  Procedure Laterality Date  . Portacath placement    . Cesarean section  [id number]  . Tubal ligation  2004  . Breast  lumpectomy Right 2012  . Finger surgery Left 1986    reconstruction, left pinky  . Wrist surgery Left 1987  . Coronary angioplasty with stent placement  20123    Myocardial infarction  . Left heart catheterization with coronary angiogram N/A 10/16/2012    Procedure: LEFT HEART CATHETERIZATION WITH CORONARY ANGIOGRAM;  Surgeon: Clent Demark, MD;  Location: [facility] CATH LAB;  Service: Cardiovascular;  Laterality: N/A;    FAMILY HISTORY Family History  Problem Relation Age of Onset  . Cancer Maternal Grandmother 69    breast       ADVANCED DIRECTIVES:    HEALTH MAINTENANCE: History  Substance Use Topics  . Smoking status: Current Every Day Smoker -- 1.00 packs/day for 15 years    Types: Cigarettes  . Smokeless tobacco: Not on file  . Alcohol Use: No     Colonoscopy:  PAP:  Bone density:  Lipid panel:  Allergies  Allergen Reactions  . Codeine Nausea And Vomiting  . Penicillins Rash    Current Outpatient Prescriptions  Medication Sig Dispense Refill  . albuterol (PROVENTIL HFA;VENTOLIN HFA) 108 (90 BASE) MCG/ACT inhaler Inhale 2 puffs into the lungs every 6 (six) hours as needed for wheezing or shortness of breath. 1 Inhaler 0  . aspirin EC 81 MG EC tablet Take 1 tablet (81 mg total) by mouth daily. 30 tablet 3  . bismuth subsalicylate (PEPTO BISMOL) 262 MG/15ML suspension Take 30 mLs by mouth every 6 (six) hours as needed for indigestion.    . docusate sodium (COLACE) 100 MG capsule Take  100 mg by mouth 2 (two) times daily.    . furosemide (LASIX) 20 MG tablet Take 1 tablet (20 mg total) by mouth daily. 5 tablet 0  . ibuprofen (ADVIL,MOTRIN) 600 MG tablet Take 600 mg by mouth every 6 (six) hours as needed.    . lidocaine-prilocaine (EMLA) cream Apply to affected area once 30 g 3  . lisinopril (PRINIVIL,ZESTRIL) 5 MG tablet Take 1 tablet (5 mg total) by mouth daily. (Patient taking differently: Take 2.5 mg by mouth daily. ) 30 tablet 0  . metoprolol tartrate (LOPRESSOR) 25  MG tablet Take 0.5 tablets (12.5 mg total) by mouth 2 (two) times daily. 30 tablet 0  . nitroGLYCERIN (NITROSTAT) 0.4 MG SL tablet Place 1 tablet (0.4 mg total) under the tongue every 5 (five) minutes x 3 doses as needed for chest pain. 30 tablet 0  . omeprazole (PRILOSEC) 20 MG capsule Take 20 mg by mouth.    . ondansetron (ZOFRAN) 8 MG tablet Take 1 tablet (8 mg total) by mouth 2 (two) times daily. Start the day after chemo, then as needed for nausea or vomiting. 30 tablet 1  . potassium chloride 20 MEQ TBCR Take 10 mEq by mouth 2 (two) times daily. 10 tablet 0  . prochlorperazine (COMPAZINE) 10 MG tablet Take 1 tablet (10 mg total) by mouth every 6 (six) hours as needed (Nausea or vomiting). 30 tablet 1   No current facility-administered medications for this visit.   Facility-Administered Medications Ordered in Other Visits  Medication Dose Route Frequency Provider Last Rate Last Dose  . sodium chloride 0.9 % injection 10 mL  10 mL Intracatheter PRN Lloyd Huger, MD   10 mL at 04/25/15 1204    OBJECTIVE: There were no vitals filed for this visit.   There is no weight on file to calculate BMI.    ECOG FS:1 - Symptomatic but completely ambulatory  General: Well-developed, well-nourished, no acute distress. Eyes: anicteric sclera. Breasts: Exam deferred today. Lungs: Clear to auscultation bilaterally. Heart: Regular rate and rhythm. No rubs, murmurs, or gallops. Abdomen: Soft, nontender, nondistended.  Musculoskeletal: Increased swelling of both left upper and lower extremity. Neuro: Alert, answering all questions appropriately. Cranial nerves grossly intact. Skin: No rashes or petechiae noted. Psych: Normal affect.   LAB RESULTS:  Lab Results  Component Value Date   NA 134* 06/06/2015   K 3.1* 06/06/2015   CL 99* 06/06/2015   CO2 26 06/06/2015   GLUCOSE 114* 06/06/2015   BUN 7 06/06/2015   CREATININE 0.57 06/06/2015   CALCIUM 8.2* 06/06/2015   PROT 5.8* 06/06/2015    ALBUMIN 2.9* 06/06/2015   AST 21 06/06/2015   ALT 24 06/06/2015   ALKPHOS 75 06/06/2015   BILITOT 0.4 06/06/2015   GFRNONAA >60 06/06/2015   GFRAA >60 06/06/2015    Lab Results  Component Value Date   WBC 4.0 06/06/2015   NEUTROABS 1.9 06/06/2015   HGB 9.2* 06/06/2015   HCT 26.8* 06/06/2015   MCV 93.3 06/06/2015   PLT 272 06/06/2015     STUDIES: Ct Angio Chest Pe W/cm &/or Wo Cm  06/05/2015   CLINICAL DATA:  Cough and left upper extremity swelling 3 weeks. History of right-sided breast cancer and ovarian cancer.  EXAM: CT ANGIOGRAPHY CHEST WITH CONTRAST  TECHNIQUE: Multidetector CT imaging of the chest was performed using the standard protocol during bolus administration of intravenous contrast. Multiplanar CT image reconstructions and MIPs were obtained to evaluate the vascular anatomy.  CONTRAST:  23m  OMNIPAQUE IOHEXOL 350 MG/ML SOLN Contrast extravasation occurred at the end of the injection as patient evaluated by Dr. Posey Pronto and follow-up instructions provided.  COMPARISON:  02/18/2015 and 01/23/2015  FINDINGS: Left-sided cardiac pacemaker present. Lungs are well inflated without focal consolidation or effusion. Subtle emphysematous disease over the mid to upper lungs. Subtle stable peripheral reticular density over the posterior right upper lobe likely sequelae from patient's previously noted diffuse bilateral reticulonodular process 01/23/2015 which has otherwise completely resolved. Subtle linear hazy density over the posterior left lower lobe likely atelectasis. Complete resolution of the previously noted nodule opacification over the anterior right lung base. Airways are within normal.  Heart is normal in size. There is a small amount of pericardial fluid without significant change. Stent over the left anterior descending coronary artery unchanged. No evidence of mediastinal, hilar or axillary adenopathy. Mild ectasia of the ascending thoracic aorta measuring 3.4 cm in AP diameter. No  evidence of pulmonary embolism.  Possible slightly more prominent 1.4 cm nodular opacity over the upper-outer right breast towards the axilla.  Images through the upper abdomen are unchanged. Remaining bones and soft tissues are within normal.  Review of the MIP images confirms the above findings.  IMPRESSION: Focal linear atelectasis over the posterior left lower lobe. Subtle peripheral reticular density over the posterior right upper lobe likely sequelae from patient's previously seen infectious/inflammatory process. Complete resolution of previously seen nodular opacity over the right base. Mild emphysematous disease.  No evidence pulmonary embolism.  Stable small amount of pericardial fluid.  Cardiac stent.  Possible slightly more prominent nodular opacity over the upper-outer right breast measuring 1.4 cm. Recommend mammographic correlation.  Mild ectasia of the ascending thoracic aorta measuring 3.4 cm unchanged. Recommend annual imaging followup by CTA or MRA. This recommendation follows 2010 ACCF/AHA/AATS/ACR/ASA/SCA/SCAI/SIR/STS/SVM Guidelines for the Diagnosis and Management of Patients with Thoracic Aortic Disease. Circulation.2010; 121: U314-H702.   Electronically Signed   By: Marin Olp M.D.   On: 06/05/2015 11:18   US Venous Img Lower Unilateral Left  05/30/2015   CLINICAL DATA:  Left leg pain and swelling for 2 weeks. Partial history of breast carcinoma.  EXAM: LEFT LOWER EXTREMITY VENOUS DOPPLER ULTRASOUND  TECHNIQUE: Gray-scale sonography with graded compression, as well as color Doppler and duplex ultrasound were performed to evaluate the lower extremity deep venous systems from the level of the common femoral vein and including the common femoral, femoral, profunda femoral, popliteal and calf veins including the posterior tibial, peroneal and gastrocnemius veins when visible. The superficial great saphenous vein was also interrogated. Spectral Doppler was utilized to evaluate flow at rest  and with distal augmentation maneuvers in the common femoral, femoral and popliteal veins.  COMPARISON:  None.  FINDINGS: Contralateral Common Femoral Vein: Respiratory phasicity is normal and symmetric with the symptomatic side. No evidence of thrombus. Normal compressibility.  Common Femoral Vein: No evidence of thrombus. Normal compressibility, respiratory phasicity and response to augmentation.  Saphenofemoral Junction: No evidence of thrombus. Normal compressibility and flow on color Doppler imaging.  Profunda Femoral Vein: No evidence of thrombus. Normal compressibility and flow on color Doppler imaging.  Femoral Vein: No evidence of thrombus. Normal compressibility, respiratory phasicity and response to augmentation.  Popliteal Vein: No evidence of thrombus. Normal compressibility, respiratory phasicity and response to augmentation.  Calf Veins: No evidence of thrombus. Normal compressibility and flow on color Doppler imaging.  Superficial Great Saphenous Vein: No evidence of thrombus. Normal compressibility and flow on color Doppler imaging.  Venous Reflux:  None.  Other Findings:  None.  IMPRESSION: No evidence of deep venous thrombosis.   Electronically Signed   By: Earle Gell M.D.   On: 05/30/2015 16:29   US Venous Img Upper Uni Left  05/30/2015   CLINICAL DATA:  Left lower extremity swelling  EXAM: LEFT LOWER EXTREMITY VENOUS DUPLEX ULTRASOUND  TECHNIQUE: Doppler venous assessment of the left lower extremity deep venous system was performed, including characterization of spectral flow, compressibility, and phasicity.  COMPARISON:  None.  FINDINGS: There is complete compressibility of the left common femoral, femoral, and popliteal veins. Doppler analysis demonstrates respiratory phasicity and augmentation of flow upon calf compression.  IMPRESSION: No evidence of left lower extremity DVT.   Electronically Signed   By: Marybelle Killings M.D.   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 ER/PR positive, HER-2/neu not overexpressing. Original primary was in left breast.  PLAN:    1. Breast cancer: Although patient has a lobular subtype, she will still benefit from palliative chemotherapy. Ca 27.29 is only mildly elevated at 78.9. Proceed with cycle 3, day 8 of gemcitabine and Taxotere today.  Return to clinic in 2 weeks for consideration of cycle 4, day 1. She will have Neulasta support with ONPRO on day 8 with day 15 off. Plan to do 4 cycles and then reimage.  Once patient completes chemotherapy, will consider reinitiating an aromatase inhibitor. 2. Left upper and lower extremity swelling: DVT as well as CT scan are negative for DVT or other acute pathology. Likely secondary to lymphedema from prior surgeries. Patient was given a referral to lymphedema clinic. 3. Pain: Continue current narcotic regimen. 4. Leukocytosis: Resolved.  Likely secondary to Neulasta. 5. Anemia: Secondary to chemotherapy, monitor.   Patient expressed understanding and was in agreement with this plan. She also understands that She can call clinic at any time with any questions, concerns, or complaints.   Lloyd Huger, MD   06/12/2015 3:09 PM

## 2015-06-19 ENCOUNTER — Ambulatory Visit: Payer: Medicaid Other | Attending: Oncology | Admitting: Occupational Therapy

## 2015-06-19 DIAGNOSIS — I890 Lymphedema, not elsewhere classified: Secondary | ICD-10-CM

## 2015-06-19 NOTE — Patient Instructions (Signed)
Bandaging of R UE - with isotoner glove,  Lotion ,stockinette,  Artiflex 10 cm and 15 cm  From hand to upper arm  6 cm shortstrethc hand to forearm ; 8 cm wrist , hand to upperarm ; 10 cm for mid forearm to upper arm  To redo every 48 hrs - precautions reviewed  Pt and caregivers(2) verbalize understanding   HEP for ROM   Fisting, wrist and elbow flexion and exteniton Shoulder D1 and D2 pattern AROM

## 2015-06-19 NOTE — Therapy (Signed)
[place] [address], [place], [address] Phone: [phone]   Fax:  [phone]  Occupational Therapy Treatment  Patient Details  Name: [name] MRN: [id number] Date of Birth: 08-Dec-1970 Referring Provider:  Lloyd Huger, MD  Encounter Date: 06/19/2015      OT End of Session - 06/19/15 1848    Visit Number 1   Number of Visits 4   Date for OT Re-Evaluation 07/17/15   OT Start Time 8182   OT Stop Time 1155   OT Time Calculation (min) 80 min   Activity Tolerance Patient tolerated treatment well   Behavior During Therapy [facility] for tasks assessed/performed      Past Medical History  Diagnosis Date  . Asthma 2005  . Hypertension 2012  . Personal history of tobacco use, presenting hazards to health   . Myocardial infarction 10/2012  . Cancer 2012    breast cancer, right-treated with neoadjunvant chemotherapy followed by partial mastectomy with sn bx and radiation. Pt is currently taking Tamoxifen since January 2013   . Lump or mass in breast 2012    right  . Malignant neoplasm of upper-outer quadrant of female breast 2012    right  . Breast cancer   . CAD (coronary artery disease)   . MI (myocardial infarction)     Past Surgical History  Procedure Laterality Date  . Portacath placement    . Cesarean section  [id number]  . Tubal ligation  2004  . Breast lumpectomy Right 2012  . Finger surgery Left 1986    reconstruction, left pinky  . Wrist surgery Left 1987  . Coronary angioplasty with stent placement  20123    Myocardial infarction  . Left heart catheterization with coronary angiogram N/A 10/16/2012    Procedure: LEFT HEART CATHETERIZATION WITH CORONARY ANGIOGRAM;  Surgeon: Clent Demark, MD;  Location: [facility] CATH LAB;  Service: Cardiovascular;  Laterality: N/A;    There were no vitals filed for this visit.  Visit Diagnosis:  Lymphedema - Plan: Ot plan of care cert/re-cert       Subjective Assessment - 06/19/15 1836    Patient Stated Goals Get the swelling down in my L arm and legs - that they do not hurt so much     Pain Score 9    Pain Location Arm   Pain Orientation Left   Multiple Pain Sites Yes   Pain Score 8   Pain Location Leg   Pain Orientation Right;Left   Pain Descriptors / Indicators Aching             LYMPHEDEMA/ONCOLOGY QUESTIONNAIRE - 06/19/15 1838    Surgeries   Lumpectomy Date 11/09/10   Date Lymphedema/Swelling Started   Date 04/09/15   Treatment   Active Chemotherapy Treatment Yes   Past Radiation Treatment Yes   Date 11/09/10   What other symptoms do you have   Are you Having Heaviness or Tightness Yes   Are you having pitting edema Yes   Body Site arm   Is it Hard or Difficult finding clothes that fit Yes   Other Symptoms pain    Lymphedema Stage   Stage STAGE 2 SPONTANEOUSLY IRREVERSIBLE   Lymphedema Assessments   Lymphedema Assessments Upper extremities   Right Upper Extremity Lymphedema   15 cm Proximal to Olecranon Process 23.5 cm   10 cm Proximal to Olecranon Process 21.5 cm   Olecranon Process 21.6 cm   15 cm Proximal  to Ulnar Styloid Process 20.5 cm   10 cm Proximal to Ulnar Styloid Process 17.5 cm   Just Proximal to Ulnar Styloid Process 15 cm   At Base of 2nd Digit 6 cm   At Base of Thumb 6 cm   Left Upper Extremity Lymphedema   15 cm Proximal to Olecranon Process 30 cm   10 cm Proximal to Olecranon Process 29.5 cm   Olecranon Process 32 cm   15 cm Proximal to Ulnar Styloid Process 30 cm   10 cm Proximal to Ulnar Styloid Process 25.7 cm   Just Proximal to Ulnar Styloid Process 19.7 cm   At Base of 2nd Digit 6.7 cm   At Base of Thumb 7 cm   Right Lower Extremity Lymphedema   At Midpatella/Popliteal Crease 31 cm   20 cm Proximal to Floor at Lateral Plantar Foot 28.2 1   10  cm Proximal to Floor at Lateral Malleoli 21.5 cm   Circumference of ankle/heel 30.5 cm.   5 cm Proximal to 1st MTP Joint 20.7 cm    Around Proximal Great Toe 7.2 cm   Left Lower Extremity Lymphedema   At Midpatella/Popliteal Crease 30.2 cm   20 cm Proximal to Floor at Lateral Plantar Foot 29 cm   10 cm Proximal to Floor at Lateral Malleoli 22.3 cm   Circumference of ankle/heel 30 cm.   5 cm Proximal to 1st MTP Joint 19.5 cm   Around Proximal Great Toe 7 cm                 OT Treatments/Exercises (OP) - 06/19/15 0001    Manual Therapy   Manual therapy comments compression bandaging done - see pt instruction                OT Education - 06/19/15 1847    Education provided Yes   Education Details HEP, bandaging , precautions    Person(s) Educated Patient;Caregiver(s)   Methods Explanation;Demonstration;Tactile cues;Verbal cues;Handout   Comprehension Tactile cues required;Verbal cues required;Returned demonstration;Verbalized understanding          OT Short Term Goals - 06/19/15 1957    OT SHORT TERM GOAL #1   Title L UE circumference decrease by at least  2 cm at wrist, 5 cm at forearm and elbow and 3 at upper arm  to be measured for compression garments    Time 4   Period Weeks   Status New   OT SHORT TERM GOAL #2   Title Pt and caregivers to be Ind in bandaging and HEP to decrease L UE circumference    Baseline no knowledge    Time 4   Period Weeks   Status New           OT Long Term Goals - 06/19/15 1959    OT LONG TERM GOAL #1   Title Pt to be ind in donning, doffing and wearing compression garments for L UE and bilateral LE to decrease circumference and decrease risk for infection    Time 6   Period Weeks   Status New               Plan - 06/19/15 1927    Clinical Impression Statement Pt present with L UE stage 2 lymphedema all though pt had lymph nodes removed  on the R in the past - ultrasound negative for DVT - pt do show stage 1 lymphedema in bilateral LE - pt has stage IV breast CA with ovarion and  abdominal metastasis- primary  origan was L breast -  pt L  UE increase compare to the R by  .7 in diigts, 4.7 at wrist, , 9.5 at forearm , 9.4 at elbow, 6.5 in upper arm -  pitting edema and hard time getting clothes or jewelry fitted . LE  increase at ankle to knee by .8 - would recommend daytime compression knee highs for bilateral LE - pt report they decrease during night time but increase during day with 9/10 pain in LE - pt was bandage this  date for L UE - and family /.caregivers educated - would  contact Rep for daytime and nighttime compresion garments for L UE to decrease  lymphedema and decrease risk for infection     Pt will benefit from skilled therapeutic intervention in order to improve on the following deficits (Retired) Pain;Decreased skin integrity;Increased edema   Rehab Potential Good   OT Frequency Other (comment)   OT Duration 4 weeks   OT Treatment/Interventions Self-care/ADL training;Manual lymph drainage;Compression bandaging;Therapeutic exercise;Patient/family education   Plan Assess bandaging and measurements for circumference    OT Home Exercise Plan see pt instruction    Consulted and Agree with Plan of Care Patient        Problem List Patient Active Problem List   Diagnosis Date Noted  . Lobular breast cancer 04/09/2015  . Acute respiratory failure with hypoxia 01/24/2015  . Community acquired pneumonia 01/23/2015  . Cancer   . Malignant neoplasm of upper-outer quadrant of female breast   . Myocardial infarction 10/08/2012    Rosalyn Gess  OTR/l,CLT  06/19/2015, 8:07 PM  [place] [address] [place], [address] Phone: [phone]   Fax:  [phone]

## 2015-06-20 ENCOUNTER — Inpatient Hospital Stay: Payer: Medicaid Other | Attending: Oncology

## 2015-06-20 ENCOUNTER — Other Ambulatory Visit: Payer: Self-pay | Admitting: *Deleted

## 2015-06-20 ENCOUNTER — Ambulatory Visit (HOSPITAL_BASED_OUTPATIENT_CLINIC_OR_DEPARTMENT_OTHER): Payer: Medicaid Other | Admitting: Oncology

## 2015-06-20 ENCOUNTER — Telehealth: Payer: Self-pay | Admitting: Oncology

## 2015-06-20 ENCOUNTER — Ambulatory Visit: Payer: Medicaid Other

## 2015-06-20 VITALS — BP 126/80 | HR 120 | Temp 98.7°F | Resp 18

## 2015-06-20 DIAGNOSIS — Z79899 Other long term (current) drug therapy: Secondary | ICD-10-CM

## 2015-06-20 DIAGNOSIS — M7989 Other specified soft tissue disorders: Secondary | ICD-10-CM | POA: Diagnosis not present

## 2015-06-20 DIAGNOSIS — C772 Secondary and unspecified malignant neoplasm of intra-abdominal lymph nodes: Secondary | ICD-10-CM | POA: Insufficient documentation

## 2015-06-20 DIAGNOSIS — Z5111 Encounter for antineoplastic chemotherapy: Secondary | ICD-10-CM | POA: Diagnosis not present

## 2015-06-20 DIAGNOSIS — C50919 Malignant neoplasm of unspecified site of unspecified female breast: Secondary | ICD-10-CM

## 2015-06-20 DIAGNOSIS — Z95828 Presence of other vascular implants and grafts: Secondary | ICD-10-CM

## 2015-06-20 DIAGNOSIS — C7989 Secondary malignant neoplasm of other specified sites: Secondary | ICD-10-CM | POA: Diagnosis not present

## 2015-06-20 DIAGNOSIS — Z170 Estrogen receptor positive status [ER+]: Secondary | ICD-10-CM

## 2015-06-20 DIAGNOSIS — D72829 Elevated white blood cell count, unspecified: Secondary | ICD-10-CM | POA: Diagnosis not present

## 2015-06-20 DIAGNOSIS — C7981 Secondary malignant neoplasm of breast: Secondary | ICD-10-CM | POA: Insufficient documentation

## 2015-06-20 DIAGNOSIS — C50911 Malignant neoplasm of unspecified site of right female breast: Secondary | ICD-10-CM

## 2015-06-20 DIAGNOSIS — C7960 Secondary malignant neoplasm of unspecified ovary: Secondary | ICD-10-CM | POA: Insufficient documentation

## 2015-06-20 DIAGNOSIS — C50912 Malignant neoplasm of unspecified site of left female breast: Secondary | ICD-10-CM

## 2015-06-20 LAB — CBC WITH DIFFERENTIAL/PLATELET
BASOS ABS: 0.2 10*3/uL — AB (ref 0–0.1)
Basophils Relative: 1 %
Eosinophils Absolute: 0.1 10*3/uL (ref 0–0.7)
Eosinophils Relative: 0 %
HEMATOCRIT: 30.1 % — AB (ref 35.0–47.0)
Hemoglobin: 9.9 g/dL — ABNORMAL LOW (ref 12.0–16.0)
Lymphs Abs: 2 10*3/uL (ref 1.0–3.6)
MCH: 32.8 pg (ref 26.0–34.0)
MCHC: 32.8 g/dL (ref 32.0–36.0)
MCV: 100 fL (ref 80.0–100.0)
Monocytes Absolute: 2 10*3/uL — ABNORMAL HIGH (ref 0.2–0.9)
Monocytes Relative: 8 %
Neutro Abs: 19.8 10*3/uL — ABNORMAL HIGH (ref 1.4–6.5)
Neutrophils Relative %: 83 %
PLATELETS: 487 10*3/uL — AB (ref 150–440)
RBC: 3.01 MIL/uL — ABNORMAL LOW (ref 3.80–5.20)
RDW: 29.1 % — AB (ref 11.5–14.5)
WBC: 24.1 10*3/uL — AB (ref 3.6–11.0)

## 2015-06-20 LAB — COMPREHENSIVE METABOLIC PANEL
ALT: 14 U/L (ref 14–54)
AST: 20 U/L (ref 15–41)
Albumin: 2.8 g/dL — ABNORMAL LOW (ref 3.5–5.0)
Alkaline Phosphatase: 94 U/L (ref 38–126)
Anion gap: 13 (ref 5–15)
BUN: 8 mg/dL (ref 6–20)
CHLORIDE: 99 mmol/L — AB (ref 101–111)
CO2: 23 mmol/L (ref 22–32)
Calcium: 8 mg/dL — ABNORMAL LOW (ref 8.9–10.3)
Creatinine, Ser: 0.56 mg/dL (ref 0.44–1.00)
GFR calc Af Amer: >60 mL/min (ref >60)
GFR calc non Af Amer: >60 mL/min (ref >60)
Glucose, Bld: 97 mg/dL (ref 65–99)
Potassium: 4 mmol/L (ref 3.5–5.1)
Sodium: 135 mmol/L (ref 135–145)
Total Bilirubin: 0.4 mg/dL (ref 0.3–1.2)
Total Protein: 5.5 g/dL — ABNORMAL LOW (ref 6.5–8.1)

## 2015-06-20 MED ORDER — PROCHLORPERAZINE MALEATE 10 MG PO TABS
10.0000 mg | ORAL_TABLET | Freq: Once | ORAL | Status: AC
  Administered 2015-06-20: 10 mg via ORAL
  Filled 2015-06-20: qty 1

## 2015-06-20 MED ORDER — OMEPRAZOLE 20 MG PO CPDR: 20.0000 mg | DELAYED_RELEASE_CAPSULE | Freq: Every day | ORAL | Status: DC

## 2015-06-20 MED ORDER — SODIUM CHLORIDE 0.9 % IJ SOLN
10.0000 mL | INTRAMUSCULAR | Status: DC | PRN
  Administered 2015-06-20: 10 mL via INTRAVENOUS
  Filled 2015-06-20: qty 10

## 2015-06-20 MED ORDER — SODIUM CHLORIDE 0.9 % IV SOLN
1000.0000 mg/m2 | Freq: Once | INTRAVENOUS | Status: AC
  Administered 2015-06-20: 1520 mg via INTRAVENOUS
  Filled 2015-06-20: qty 40

## 2015-06-20 MED ORDER — SODIUM CHLORIDE 0.9 % IV SOLN
Freq: Once | INTRAVENOUS | Status: AC
  Administered 2015-06-20: 10:00:00 via INTRAVENOUS
  Filled 2015-06-20: qty 1000

## 2015-06-20 MED ORDER — HEPARIN SOD (PORK) LOCK FLUSH 100 UNIT/ML IV SOLN
500.0000 [IU] | Freq: Once | INTRAVENOUS | Status: AC
  Administered 2015-06-20: 500 [IU] via INTRAVENOUS
  Filled 2015-06-20: qty 5

## 2015-06-20 NOTE — Progress Notes (Signed)
[place]  [phone] [phone]  ID: [name] OB: 12-Jul-1971  MR#: [id number]  STM#:[id number]  Patient Care Team: [facility] as PCP - General  CHIEF COMPLAINT:  No chief complaint on file.   INTERVAL HISTORY: Patient returns to clinic today for further evaluation and consideration of cycle 4, day 1 of gemcitabine and Taxotere. Gemcitabine only today. Her left upper extremity lymphedema has significantly improved since going to lymphedema clinic earlier this week. She currently has the lymphedema wrap on. She also has increased swelling of her left lower extremity. She is otherwise tolerating her treatments well. She currently feels well. She denies any fevers. She does not complain of any further pain. She denies chest pain or shortness of breath. She denies any nausea, vomiting, constipation, or diarrhea. She has no urinary complaints. Patient offers no further specific complaints.  REVIEW OF SYSTEMS:   Review of Systems  Constitutional: Positive for malaise/fatigue. Negative for fever and weight loss.  Respiratory: Negative.   Cardiovascular: Negative.   Gastrointestinal: Negative.     As per HPI. Otherwise, a complete review of systems is negatve.  PAST MEDICAL HISTORY: Past Medical History  Diagnosis Date  . Asthma 2005  . Hypertension 2012  . Personal history of tobacco use, presenting hazards to health   . Myocardial infarction 10/2012  . Cancer 2012    breast cancer, right-treated with neoadjunvant chemotherapy followed by partial mastectomy with sn bx and radiation. Pt is currently taking Tamoxifen since January 2013   . Lump or mass in breast 2012    right  . Malignant neoplasm of upper-outer quadrant of female breast 2012    right  . Breast cancer   . CAD (coronary artery disease)   . MI (myocardial infarction)     PAST SURGICAL HISTORY: Past Surgical History  Procedure Laterality Date    . Portacath placement    . Cesarean section  [id number]  . Tubal ligation  2004  . Breast lumpectomy Right 2012  . Finger surgery Left 1986    reconstruction, left pinky  . Wrist surgery Left 1987  . Coronary angioplasty with stent placement  20123    Myocardial infarction  . Left heart catheterization with coronary angiogram N/A 10/16/2012    Procedure: LEFT HEART CATHETERIZATION WITH CORONARY ANGIOGRAM;  Surgeon: Clent Demark, MD;  Location: [facility] CATH LAB;  Service: Cardiovascular;  Laterality: N/A;    FAMILY HISTORY Family History  Problem Relation Age of Onset  . Cancer Maternal Grandmother 69    breast       ADVANCED DIRECTIVES:    HEALTH MAINTENANCE: Social History  Substance Use Topics  . Smoking status: Current Every Day Smoker -- 1.00 packs/day for 15 years    Types: Cigarettes  . Smokeless tobacco: Not on file  . Alcohol Use: No     Colonoscopy:  PAP:  Bone density:  Lipid panel:  Allergies  Allergen Reactions  . Codeine Nausea And Vomiting  . Penicillins Rash    Current Outpatient Prescriptions  Medication Sig Dispense Refill  . albuterol (PROVENTIL HFA;VENTOLIN HFA) 108 (90 BASE) MCG/ACT inhaler Inhale 2 puffs into the lungs every 6 (six) hours as needed for wheezing or shortness of breath. 1 Inhaler 0  . aspirin EC 81 MG EC tablet Take 1 tablet (81 mg total) by mouth daily. 30 tablet 3  . bismuth subsalicylate (PEPTO BISMOL) 262 MG/15ML suspension Take 30 mLs by mouth every 6 (six) hours  as needed for indigestion.    . docusate sodium (COLACE) 100 MG capsule Take 100 mg by mouth 2 (two) times daily.    . furosemide (LASIX) 20 MG tablet Take 1 tablet (20 mg total) by mouth daily. 5 tablet 0  . ibuprofen (ADVIL,MOTRIN) 600 MG tablet Take 600 mg by mouth every 6 (six) hours as needed.    . lidocaine-prilocaine (EMLA) cream Apply to affected area once 30 g 3  . lisinopril (PRINIVIL,ZESTRIL) 5 MG tablet Take 1 tablet (5 mg total) by mouth daily. (Patient  taking differently: Take 2.5 mg by mouth daily. ) 30 tablet 0  . metoprolol tartrate (LOPRESSOR) 25 MG tablet Take 0.5 tablets (12.5 mg total) by mouth 2 (two) times daily. 30 tablet 0  . nitroGLYCERIN (NITROSTAT) 0.4 MG SL tablet Place 1 tablet (0.4 mg total) under the tongue every 5 (five) minutes x 3 doses as needed for chest pain. 30 tablet 0  . omeprazole (PRILOSEC) 20 MG capsule Take 20 mg by mouth.    . ondansetron (ZOFRAN) 8 MG tablet Take 1 tablet (8 mg total) by mouth 2 (two) times daily. Start the day after chemo, then as needed for nausea or vomiting. 30 tablet 1  . potassium chloride 20 MEQ TBCR Take 10 mEq by mouth 2 (two) times daily. 10 tablet 0  . prochlorperazine (COMPAZINE) 10 MG tablet Take 1 tablet (10 mg total) by mouth every 6 (six) hours as needed (Nausea or vomiting). 30 tablet 1   No current facility-administered medications for this visit.   Facility-Administered Medications Ordered in Other Visits  Medication Dose Route Frequency Provider Last Rate Last Dose  . gemcitabine (GEMZAR) 1,520 mg in sodium chloride 0.9 % 100 mL chemo infusion  1,000 mg/m2 (Treatment Plan Actual) Intravenous Once Lloyd Huger, MD      . heparin lock flush 100 unit/mL  500 Units Intravenous Once Lloyd Huger, MD      . sodium chloride 0.9 % injection 10 mL  10 mL Intracatheter PRN Lloyd Huger, MD   10 mL at 04/25/15 1204  . sodium chloride 0.9 % injection 10 mL  10 mL Intravenous PRN Lloyd Huger, MD   10 mL at 06/20/15 0855    OBJECTIVE: There were no vitals filed for this visit.   There is no weight on file to calculate BMI.    ECOG FS:1 - Symptomatic but completely ambulatory  General: Well-developed, well-nourished, no acute distress. Eyes: anicteric sclera. Breasts: Exam deferred today. Lungs: Clear to auscultation bilaterally. Heart: Regular rate and rhythm. No rubs, murmurs, or gallops. Abdomen: Soft, nontender, nondistended.  Musculoskeletal: Increased  swelling of both left lower extremity. Lymphedema wrap in place on left arm. Neuro: Alert, answering all questions appropriately. Cranial nerves grossly intact. Skin: No rashes or petechiae noted. Psych: Normal affect.   LAB RESULTS:  Lab Results  Component Value Date   NA 135 06/20/2015   K 4.0 06/20/2015   CL 99* 06/20/2015   CO2 23 06/20/2015   GLUCOSE 97 06/20/2015   BUN 8 06/20/2015   CREATININE 0.56 06/20/2015   CALCIUM 8.0* 06/20/2015   PROT 5.5* 06/20/2015   ALBUMIN 2.8* 06/20/2015   AST 20 06/20/2015   ALT 14 06/20/2015   ALKPHOS 94 06/20/2015   BILITOT 0.4 06/20/2015   GFRNONAA >60 06/20/2015   GFRAA >60 06/20/2015    Lab Results  Component Value Date   WBC 24.1* 06/20/2015   NEUTROABS 19.8* 06/20/2015   HGB 9.9*  06/20/2015   HCT 30.1* 06/20/2015   MCV 100.0 06/20/2015   PLT 487* 06/20/2015     STUDIES: Ct Angio Chest Pe W/cm &/or Wo Cm  06/05/2015   CLINICAL DATA:  Cough and left upper extremity swelling 3 weeks. History of right-sided breast cancer and ovarian cancer.  EXAM: CT ANGIOGRAPHY CHEST WITH CONTRAST  TECHNIQUE: Multidetector CT imaging of the chest was performed using the standard protocol during bolus administration of intravenous contrast. Multiplanar CT image reconstructions and MIPs were obtained to evaluate the vascular anatomy.  CONTRAST:  21m OMNIPAQUE IOHEXOL 350 MG/ML SOLN Contrast extravasation occurred at the end of the injection as patient evaluated by Dr. PPosey Prontoand follow-up instructions provided.  COMPARISON:  02/18/2015 and 01/23/2015  FINDINGS: Left-sided cardiac pacemaker present. Lungs are well inflated without focal consolidation or effusion. Subtle emphysematous disease over the mid to upper lungs. Subtle stable peripheral reticular density over the posterior right upper lobe likely sequelae from patient's previously noted diffuse bilateral reticulonodular process 01/23/2015 which has otherwise completely resolved. Subtle linear hazy  density over the posterior left lower lobe likely atelectasis. Complete resolution of the previously noted nodule opacification over the anterior right lung base. Airways are within normal.  Heart is normal in size. There is a small amount of pericardial fluid without significant change. Stent over the left anterior descending coronary artery unchanged. No evidence of mediastinal, hilar or axillary adenopathy. Mild ectasia of the ascending thoracic aorta measuring 3.4 cm in AP diameter. No evidence of pulmonary embolism.  Possible slightly more prominent 1.4 cm nodular opacity over the upper-outer right breast towards the axilla.  Images through the upper abdomen are unchanged. Remaining bones and soft tissues are within normal.  Review of the MIP images confirms the above findings.  IMPRESSION: Focal linear atelectasis over the posterior left lower lobe. Subtle peripheral reticular density over the posterior right upper lobe likely sequelae from patient's previously seen infectious/inflammatory process. Complete resolution of previously seen nodular opacity over the right base. Mild emphysematous disease.  No evidence pulmonary embolism.  Stable small amount of pericardial fluid.  Cardiac stent.  Possible slightly more prominent nodular opacity over the upper-outer right breast measuring 1.4 cm. Recommend mammographic correlation.  Mild ectasia of the ascending thoracic aorta measuring 3.4 cm unchanged. Recommend annual imaging followup by CTA or MRA. This recommendation follows 2010 ACCF/AHA/AATS/ACR/ASA/SCA/SCAI/SIR/STS/SVM Guidelines for the Diagnosis and Management of Patients with Thoracic Aortic Disease. Circulation.2010; 121:: E081-K481   Electronically Signed   By: DMarin OlpM.D.   On: 06/05/2015 11:18   UKoreaVenous Img Lower Unilateral Left  05/30/2015   CLINICAL DATA:  Left leg pain and swelling for 2 weeks. Partial history of breast carcinoma.  EXAM: LEFT LOWER EXTREMITY VENOUS DOPPLER ULTRASOUND   TECHNIQUE: Gray-scale sonography with graded compression, as well as color Doppler and duplex ultrasound were performed to evaluate the lower extremity deep venous systems from the level of the common femoral vein and including the common femoral, femoral, profunda femoral, popliteal and calf veins including the posterior tibial, peroneal and gastrocnemius veins when visible. The superficial great saphenous vein was also interrogated. Spectral Doppler was utilized to evaluate flow at rest and with distal augmentation maneuvers in the common femoral, femoral and popliteal veins.  COMPARISON:  None.  FINDINGS: Contralateral Common Femoral Vein: Respiratory phasicity is normal and symmetric with the symptomatic side. No evidence of thrombus. Normal compressibility.  Common Femoral Vein: No evidence of thrombus. Normal compressibility, respiratory phasicity and response to augmentation.  Saphenofemoral Junction: No evidence of thrombus. Normal compressibility and flow on color Doppler imaging.  Profunda Femoral Vein: No evidence of thrombus. Normal compressibility and flow on color Doppler imaging.  Femoral Vein: No evidence of thrombus. Normal compressibility, respiratory phasicity and response to augmentation.  Popliteal Vein: No evidence of thrombus. Normal compressibility, respiratory phasicity and response to augmentation.  Calf Veins: No evidence of thrombus. Normal compressibility and flow on color Doppler imaging.  Superficial Great Saphenous Vein: No evidence of thrombus. Normal compressibility and flow on color Doppler imaging.  Venous Reflux:  None.  Other Findings:  None.  IMPRESSION: No evidence of deep venous thrombosis.   Electronically Signed   By: Earle Gell M.D.   On: 05/30/2015 16:29   US Venous Img Upper Uni Left  05/30/2015   CLINICAL DATA:  Left lower extremity swelling  EXAM: LEFT LOWER EXTREMITY VENOUS DUPLEX ULTRASOUND  TECHNIQUE: Doppler venous assessment of the left lower extremity deep  venous system was performed, including characterization of spectral flow, compressibility, and phasicity.  COMPARISON:  None.  FINDINGS: There is complete compressibility of the left common femoral, femoral, and popliteal veins. Doppler analysis demonstrates respiratory phasicity and augmentation of flow upon calf compression.  IMPRESSION: No evidence of left lower extremity DVT.   Electronically Signed   By: Marybelle Killings M.D.   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 ER/PR positive, HER-2/neu not overexpressing. Original primary was in left breast.  PLAN:    1. Breast cancer: Although patient has a lobular subtype, she will still benefit from palliative chemotherapy. Ca 27.29 is only mildly elevated at 78.9. Proceed with cycle 4, day 1 of gemcitabine and Taxotere today.  Gemcitabine only today. Return to clinic in 1 week for consideration of cycle 4, day 8. She will have Neulasta support with ONPRO on day 8 with day 15 off. Plan to do 4 cycles and then reimage.  Once patient completes chemotherapy, will consider reinitiating an aromatase inhibitor. 2. Left upper and lower extremity swelling: DVT as well as CT scan are negative for DVT or other acute pathology. Likely secondary to lymphedema from prior surgeries. Continue treatment per lymphedema clinic. 3. Pain: Continue current narcotic regimen. 4. Leukocytosis: Likely secondary to Neulasta. 5. Anemia: Secondary to chemotherapy, monitor.   Patient expressed understanding and was in agreement with this plan. She also understands that She can call clinic at any time with any questions, concerns, or complaints.   Lloyd Huger, MD   06/20/2015 10:25 AM

## 2015-06-20 NOTE — Telephone Encounter (Signed)
Spoke with patient and they are inquring about filing for disability not FMLA forms, CSW will be informed.

## 2015-06-20 NOTE — Telephone Encounter (Signed)
Kandas's daughter came to my desk and said she needs help with FMLA. Please call [phone] for Keenya or her daughter Shenequa Howse number is [phone]. Thanks! ?

## 2015-06-20 NOTE — Progress Notes (Signed)
Pt went to Lymphedema clinic. L arm is wrapped , glove on hand with wrap as well. Pt's legs have edema in calves and feet as well bilaterally. 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 Reports poor appetite.Lungs remain congested with occ prod cough sputum is thick and white. Pt complains of leg pain esp after walking.

## 2015-06-26 ENCOUNTER — Ambulatory Visit: Payer: Medicaid Other | Admitting: Occupational Therapy

## 2015-06-26 DIAGNOSIS — I890 Lymphedema, not elsewhere classified: Secondary | ICD-10-CM | POA: Diagnosis not present

## 2015-06-26 NOTE — Patient Instructions (Signed)
Bandages same than last time - except Artiflex 15 cm from hand to wrist - Rosidal foam from wrist to upper arm  Short stretch same  Exercises same

## 2015-06-26 NOTE — Therapy (Signed)
[facility] [address] [place], [address] Phone: [phone]   Fax:  [phone]  Occupational Therapy Treatment  Patient Details  Name: [name] MRN: [id number] Date of Birth: [birth year] Referring Provider:  Lloyd Huger, MD  Encounter Date: 06/26/2015      OT End of Session - 06/26/15 1733    Visit Number 2   Number of Visits 4   Date for OT Re-Evaluation 07/17/15   OT Start Time 1435   OT Stop Time 1515   OT Time Calculation (min) 40 min   Activity Tolerance Patient tolerated treatment well   Behavior During Therapy [facility] for tasks assessed/performed      Past Medical History  Diagnosis Date  . Asthma 2005  . Hypertension 2012  . Personal history of tobacco use, presenting hazards to health   . Myocardial infarction 10/2012  . Cancer 2012    breast cancer, right-treated with neoadjunvant chemotherapy followed by partial mastectomy with sn bx and radiation. Pt is currently taking Tamoxifen since January 2013   . Lump or mass in breast 2012    right  . Malignant neoplasm of upper-outer quadrant of female breast 2012    right  . Breast cancer   . CAD (coronary artery disease)   . MI (myocardial infarction)     Past Surgical History  Procedure Laterality Date  . Portacath placement    . Cesarean section  [id number]  . Tubal ligation  2004  . Breast lumpectomy Right 2012  . Finger surgery Left 1986    reconstruction, left pinky  . Wrist surgery Left 1987  . Coronary angioplasty with stent placement  20123    Myocardial infarction  . Left heart catheterization with coronary angiogram N/A 10/16/2012    Procedure: LEFT HEART CATHETERIZATION WITH CORONARY ANGIOGRAM;  Surgeon: Clent Demark, MD;  Location: [facility] CATH LAB;  Service: Cardiovascular;  Laterality: N/A;    There were no vitals filed for this visit.  Visit Diagnosis:  Lymphedema      Subjective Assessment - 06/26/15  1723    Subjective  We did the bandages every other day - the exercises worked for when my bandages was tight or bothered me - the bandages do want to slide down - this am it slid down  - but the last time we took it off my arm looked smaller    Patient is accompained by: Family member   Patient Stated Goals Get the swelling down in my L arm and legs - that they do not hurt so much     Currently in Pain? Yes   Pain Score 3    Pain Location Leg   Pain Orientation Right;Left             LYMPHEDEMA/ONCOLOGY QUESTIONNAIRE - 06/26/15 1445    Left Upper Extremity Lymphedema   15 cm Proximal to Olecranon Process 31.5 cm   10 cm Proximal to Olecranon Process 28.4 cm   Olecranon Process 26.3 cm   15 cm Proximal to Ulnar Styloid Process 27.4 cm   10 cm Proximal to Ulnar Styloid Process 25 cm   Just Proximal to Ulnar Styloid Process 17.4 cm   Across Hand at [employer] 18.5 cm   At [place] of 2nd Digit 5.8 cm   At [facility] of Thumb 6.1 cm                 OT Treatments/Exercises (OP) -  06/26/15 0001    ADLs   ADL Comments Daughter ed on bandaging again -and if slide down redo only top Rosidal foam and short stretch - measurements taken see flowsheet    Manual Therapy   Manual therapy comments Compression bandages removed and skin care done - wash and Eucerin lotion applied - new isotoner glove , 12 cm stockinette donne , Artiflex  10 cm from hand to elbow, 15 Artiflex from hand to elbow , Rosidal foam from wrist to upper arm ; 6 cm short stretch from hand to mid forearm , 8 cm from hand to elbow and 10cm from mid forearm to upper arm - E tubi grip to keep in place                 OT Education - 06/26/15 1732    Education provided Yes   Education Details HEP, bandaging, precautions    Person(s) Educated Patient;Child(ren)   Methods Explanation;Demonstration;Tactile cues;Verbal cues   Comprehension Verbalized understanding;Returned demonstration;Verbal cues required           OT Short Term Goals - 06/19/15 1957    OT SHORT TERM GOAL #1   Title L UE circumference decrease by at least  2 cm at wrist, 5 cm at forearm and elbow and 3 at upper arm  to be measured for compression garments    Time 4   Period Weeks   Status New   OT SHORT TERM GOAL #2   Title Pt and caregivers to be Ind in bandaging and HEP to decrease L UE circumference    Baseline no knowledge    Time 4   Period Weeks   Status New           OT Long Term Goals - 06/19/15 1959    OT LONG TERM GOAL #1   Title Pt to be ind in donning, doffing and wearing compression garments for L UE and bilateral LE to decrease circumference and decrease risk for infection    Time 6   Period Weeks   Status New               Plan - 06/26/15 1733    Clinical Impression Statement Pt L UE lymphedema decrease 2.3 to 2.6 at wrist and forearm - elbow decrease 5.7 and upper arm 1.5 - proximal upper arm increase because of bandaging sliding - pt family doing bandaging and OT checking and ed 1 x wk because of medicaid only authorize 3 visit- pt report both ankles and feet swelling and LE painfull when walking - will check into [employer] in meantime and send insurance info to rep  for compression garments when decrease    Pt will benefit from skilled therapeutic intervention in order to improve on the following deficits (Retired) Pain;Decreased skin integrity;Increased edema   Rehab Potential Good   OT Frequency 1x / week   OT Duration 4 weeks   OT Treatment/Interventions Self-care/ADL training;Manual lymph drainage;Compression bandaging;Therapeutic exercise;Patient/family education   Plan assess bandaging and measurements - contact reps for compression garments    OT Home Exercise Plan see pt instruction    Consulted and Agree with Plan of Care Patient;Family member/caregiver   Family Member Consulted daughter        Problem List Patient Active Problem List   Diagnosis Date Noted  . Lobular  breast cancer 04/09/2015  . Acute respiratory failure with hypoxia 01/24/2015  . Community acquired pneumonia 01/23/2015  . Cancer   . Malignant neoplasm of  upper-outer quadrant of female breast   . Myocardial infarction 10/08/2012    Rosalyn Gess OTR/L,CLT 06/26/2015, 5:39 PM  [place] [facility] [address] [place], [address] Phone: [phone]   Fax:  [phone]

## 2015-06-26 NOTE — Progress Notes (Unsigned)
PSN met with patient and daughter to advise them about how to apply for Social Security Disability.

## 2015-06-27 ENCOUNTER — Inpatient Hospital Stay (HOSPITAL_BASED_OUTPATIENT_CLINIC_OR_DEPARTMENT_OTHER): Payer: Medicaid Other | Admitting: Oncology

## 2015-06-27 ENCOUNTER — Inpatient Hospital Stay: Payer: Medicaid Other

## 2015-06-27 DIAGNOSIS — C7960 Secondary malignant neoplasm of unspecified ovary: Secondary | ICD-10-CM

## 2015-06-27 DIAGNOSIS — Z170 Estrogen receptor positive status [ER+]: Secondary | ICD-10-CM

## 2015-06-27 DIAGNOSIS — D72829 Elevated white blood cell count, unspecified: Secondary | ICD-10-CM

## 2015-06-27 DIAGNOSIS — C50919 Malignant neoplasm of unspecified site of unspecified female breast: Secondary | ICD-10-CM

## 2015-06-27 DIAGNOSIS — C772 Secondary and unspecified malignant neoplasm of intra-abdominal lymph nodes: Secondary | ICD-10-CM

## 2015-06-27 DIAGNOSIS — C7981 Secondary malignant neoplasm of breast: Secondary | ICD-10-CM

## 2015-06-27 DIAGNOSIS — Z79899 Other long term (current) drug therapy: Secondary | ICD-10-CM

## 2015-06-27 DIAGNOSIS — Z5111 Encounter for antineoplastic chemotherapy: Secondary | ICD-10-CM | POA: Diagnosis not present

## 2015-06-27 DIAGNOSIS — C50911 Malignant neoplasm of unspecified site of right female breast: Secondary | ICD-10-CM

## 2015-06-27 DIAGNOSIS — C7989 Secondary malignant neoplasm of other specified sites: Secondary | ICD-10-CM

## 2015-06-27 LAB — COMPREHENSIVE METABOLIC PANEL
ALBUMIN: 2.5 g/dL — AB (ref 3.5–5.0)
ALT: 43 U/L (ref 14–54)
ANION GAP: 14 (ref 5–15)
AST: 41 U/L (ref 15–41)
Alkaline Phosphatase: 106 U/L (ref 38–126)
CHLORIDE: 99 mmol/L — AB (ref 101–111)
CO2: 23 mmol/L (ref 22–32)
Calcium: 7.9 mg/dL — ABNORMAL LOW (ref 8.9–10.3)
Creatinine, Ser: 0.54 mg/dL (ref 0.44–1.00)
GFR calc Af Amer: >60 mL/min (ref >60)
GFR calc non Af Amer: >60 mL/min (ref >60)
GLUCOSE: 107 mg/dL — AB (ref 65–99)
Potassium: 3.5 mmol/L (ref 3.5–5.1)
SODIUM: 136 mmol/L (ref 135–145)
TOTAL PROTEIN: 5.3 g/dL — AB (ref 6.5–8.1)
Total Bilirubin: 0.3 mg/dL (ref 0.3–1.2)

## 2015-06-27 LAB — CBC WITH DIFFERENTIAL/PLATELET
BASOS ABS: 0.1 10*3/uL (ref 0–0.1)
EOS ABS: 0 10*3/uL (ref 0–0.7)
HCT: 26.7 % — ABNORMAL LOW (ref 35.0–47.0)
Hemoglobin: 9 g/dL — ABNORMAL LOW (ref 12.0–16.0)
Lymphocytes Relative: 19 %
Lymphs Abs: 1 10*3/uL (ref 1.0–3.6)
MCH: 34.5 pg — ABNORMAL HIGH (ref 26.0–34.0)
MCHC: 33.8 g/dL (ref 32.0–36.0)
MCV: 102.1 fL — ABNORMAL HIGH (ref 80.0–100.0)
MONO ABS: 1.3 10*3/uL — AB (ref 0.2–0.9)
Neutro Abs: 2.6 10*3/uL (ref 1.4–6.5)
Neutrophils Relative %: 53 %
PLATELETS: 278 10*3/uL (ref 150–440)
RBC: 2.61 MIL/uL — ABNORMAL LOW (ref 3.80–5.20)
RDW: 26.5 % — AB (ref 11.5–14.5)
WBC: 5.1 10*3/uL (ref 3.6–11.0)

## 2015-06-27 MED ORDER — MORPHINE SULFATE (PF) 2 MG/ML IV SOLN
INTRAVENOUS | Status: AC
  Filled 2015-06-27: qty 1

## 2015-06-27 MED ORDER — DOCETAXEL CHEMO INJECTION 160 MG/16ML
75.0000 mg/m2 | Freq: Once | INTRAVENOUS | Status: DC
  Filled 2015-06-27: qty 11

## 2015-06-27 MED ORDER — OXYCODONE HCL 10 MG PO TABS: 10.0000 mg | ORAL_TABLET | Freq: Four times a day (QID) | ORAL | Status: DC | PRN

## 2015-06-27 MED ORDER — SODIUM CHLORIDE 0.9 % IV SOLN
1000.0000 mg/m2 | Freq: Once | INTRAVENOUS | Status: AC
  Administered 2015-06-27: 1520 mg via INTRAVENOUS
  Filled 2015-06-27: qty 40

## 2015-06-27 MED ORDER — HEPARIN SOD (PORK) LOCK FLUSH 100 UNIT/ML IV SOLN
500.0000 [IU] | Freq: Once | INTRAVENOUS | Status: AC | PRN
  Administered 2015-06-27: 500 [IU]
  Filled 2015-06-27: qty 5

## 2015-06-27 MED ORDER — SODIUM CHLORIDE 0.9 % IV SOLN
Freq: Once | INTRAVENOUS | Status: AC
  Administered 2015-06-27: 10:00:00 via INTRAVENOUS
  Filled 2015-06-27: qty 1000

## 2015-06-27 MED ORDER — SODIUM CHLORIDE 0.9 % IV SOLN
Freq: Once | INTRAVENOUS | Status: AC
  Administered 2015-06-27: 11:00:00 via INTRAVENOUS
  Filled 2015-06-27: qty 4

## 2015-06-27 MED ORDER — PEGFILGRASTIM 6 MG/0.6ML ~~LOC~~ PSKT
6.0000 mg | PREFILLED_SYRINGE | Freq: Once | SUBCUTANEOUS | Status: AC
  Administered 2015-06-27: 6 mg via SUBCUTANEOUS

## 2015-06-27 MED ORDER — SODIUM CHLORIDE 0.9 % IJ SOLN
10.0000 mL | INTRAMUSCULAR | Status: DC | PRN
  Administered 2015-06-27: 10 mL
  Filled 2015-06-27: qty 10

## 2015-06-27 MED ORDER — SODIUM CHLORIDE 0.9 % IV SOLN
75.0000 mg/m2 | Freq: Once | INTRAVENOUS | Status: AC
  Administered 2015-06-27: 110 mg via INTRAVENOUS
  Filled 2015-06-27: qty 11

## 2015-06-27 MED ORDER — MORPHINE SULFATE 4 MG/ML IJ SOLN
2.0000 mg | Freq: Once | INTRAMUSCULAR | Status: AC
Start: 2015-06-27 — End: 2015-06-27
  Administered 2015-06-27: 2 mg via INTRAVENOUS
  Filled 2015-06-27: qty 1

## 2015-06-29 NOTE — Progress Notes (Signed)
[place]  Telephone:(336) [phone] Fax:(336) [phone]  ID: [name] OB: 09-Jun-1971  MR#: [id number]  JME#:[id number]  Patient Care Team: [facility] as PCP - General  CHIEF COMPLAINT:  No chief complaint on file.   INTERVAL HISTORY: Patient returns to clinic today for further evaluation and consideration of cycle 4, day 15 of gemcitabine and Taxotere. She has increased pain this week, particularly in her legs. Her left upper extremity lymphedema has significantly improved since going to lymphedema clinic earlier this week. She currently has the lymphedema wrap on. She also has increased swelling of her left lower extremity. She is otherwise tolerating her treatments well. She otherwise feels well. She denies any fevers. She denies chest pain or shortness of breath. She denies any nausea, vomiting, constipation, or diarrhea. She has no urinary complaints. Patient offers no further specific complaints.  REVIEW OF SYSTEMS:   Review of Systems  Constitutional: Positive for malaise/fatigue. Negative for fever and weight loss.  Respiratory: Negative.   Cardiovascular: Negative.   Gastrointestinal: Negative.     As per HPI. Otherwise, a complete review of systems is negatve.  PAST MEDICAL HISTORY: Past Medical History  Diagnosis Date  . Asthma 2005  . Hypertension 2012  . Personal history of tobacco use, presenting hazards to health   . Myocardial infarction 10/2012  . Cancer 2012    breast cancer, right-treated with neoadjunvant chemotherapy followed by partial mastectomy with sn bx and radiation. Pt is currently taking Tamoxifen since January 2013   . Lump or mass in breast 2012    right  . Malignant neoplasm of upper-outer quadrant of female breast 2012    right  . Breast cancer   . CAD (coronary artery disease)   . MI (myocardial infarction)     PAST SURGICAL HISTORY: Past Surgical History  Procedure Laterality Date  .  Portacath placement    . Cesarean section  [id number]  . Tubal ligation  2004  . Breast lumpectomy Right 2012  . Finger surgery Left 1986    reconstruction, left pinky  . Wrist surgery Left 1987  . Coronary angioplasty with stent placement  20123    Myocardial infarction  . Left heart catheterization with coronary angiogram N/A 10/16/2012    Procedure: LEFT HEART CATHETERIZATION WITH CORONARY ANGIOGRAM;  Surgeon: Clent Demark, MD;  Location: [facility] CATH LAB;  Service: Cardiovascular;  Laterality: N/A;    FAMILY HISTORY Family History  Problem Relation Age of Onset  . Cancer Maternal Grandmother 69    breast       ADVANCED DIRECTIVES:    HEALTH MAINTENANCE: Social History  Substance Use Topics  . Smoking status: Current Every Day Smoker -- 1.00 packs/day for 15 years    Types: Cigarettes  . Smokeless tobacco: Not on file  . Alcohol Use: No     Colonoscopy:  PAP:  Bone density:  Lipid panel:  Allergies  Allergen Reactions  . Codeine Nausea And Vomiting  . Penicillins Rash    Current Outpatient Prescriptions  Medication Sig Dispense Refill  . albuterol (PROVENTIL HFA;VENTOLIN HFA) 108 (90 BASE) MCG/ACT inhaler Inhale 2 puffs into the lungs every 6 (six) hours as needed for wheezing or shortness of breath. 1 Inhaler 0  . aspirin EC 81 MG EC tablet Take 1 tablet (81 mg total) by mouth daily. 30 tablet 3  . bismuth subsalicylate (PEPTO BISMOL) 262 MG/15ML suspension Take 30 mLs by mouth every 6 (six) hours as needed  for indigestion.    . docusate sodium (COLACE) 100 MG capsule Take 100 mg by mouth daily as needed.     Marland Kitchen ibuprofen (ADVIL,MOTRIN) 600 MG tablet Take 600 mg by mouth every 6 (six) hours as needed.    . lidocaine-prilocaine (EMLA) cream Apply to affected area once 30 g 3  . lisinopril (PRINIVIL,ZESTRIL) 5 MG tablet Take 1 tablet (5 mg total) by mouth daily. (Patient taking differently: Take 2.5 mg by mouth daily. ) 30 tablet 0  . metoprolol tartrate (LOPRESSOR)  25 MG tablet Take 0.5 tablets (12.5 mg total) by mouth 2 (two) times daily. 30 tablet 0  . nitroGLYCERIN (NITROSTAT) 0.4 MG SL tablet Place 1 tablet (0.4 mg total) under the tongue every 5 (five) minutes x 3 doses as needed for chest pain. 30 tablet 0  . omeprazole (PRILOSEC) 20 MG capsule Take 1 capsule (20 mg total) by mouth daily. 30 capsule 3  . ondansetron (ZOFRAN) 8 MG tablet Take 1 tablet (8 mg total) by mouth 2 (two) times daily. Start the day after chemo, then as needed for nausea or vomiting. 30 tablet 1  . Oxycodone HCl 10 MG TABS Take 1 tablet (10 mg total) by mouth every 6 (six) hours as needed. 120 tablet 0  . prochlorperazine (COMPAZINE) 10 MG tablet Take 1 tablet (10 mg total) by mouth every 6 (six) hours as needed (Nausea or vomiting). 30 tablet 1   No current facility-administered medications for this visit.   Facility-Administered Medications Ordered in Other Visits  Medication Dose Route Frequency Provider Last Rate Last Dose  . sodium chloride 0.9 % injection 10 mL  10 mL Intracatheter PRN Lloyd Huger, MD   10 mL at 04/25/15 1204    OBJECTIVE: There were no vitals filed for this visit.   There is no weight on file to calculate BMI.    ECOG FS:1 - Symptomatic but completely ambulatory  General: Well-developed, well-nourished, no acute distress. Eyes: anicteric sclera. Breasts: Exam deferred today. Lungs: Clear to auscultation bilaterally. Heart: Regular rate and rhythm. No rubs, murmurs, or gallops. Abdomen: Soft, nontender, nondistended.  Musculoskeletal: Increased swelling of both left lower extremity. Lymphedema wrap in place on left arm. Neuro: Alert, answering all questions appropriately. Cranial nerves grossly intact. Skin: No rashes or petechiae noted. Psych: Normal affect.   LAB RESULTS:  Lab Results  Component Value Date   NA 136 06/27/2015   K 3.5 06/27/2015   CL 99* 06/27/2015   CO2 23 06/27/2015   GLUCOSE 107* 06/27/2015   BUN <5*  06/27/2015   CREATININE 0.54 06/27/2015   CALCIUM 7.9* 06/27/2015   PROT 5.3* 06/27/2015   ALBUMIN 2.5* 06/27/2015   AST 41 06/27/2015   ALT 43 06/27/2015   ALKPHOS 106 06/27/2015   BILITOT 0.3 06/27/2015   GFRNONAA >60 06/27/2015   GFRAA >60 06/27/2015    Lab Results  Component Value Date   WBC 5.1 06/27/2015   NEUTROABS 2.6 06/27/2015   HGB 9.0* 06/27/2015   HCT 26.7* 06/27/2015   MCV 102.1* 06/27/2015   PLT 278 06/27/2015     STUDIES: Ct Angio Chest Pe W/cm &/or Wo Cm  06/05/2015   CLINICAL DATA:  Cough and left upper extremity swelling 3 weeks. History of right-sided breast cancer and ovarian cancer.  EXAM: CT ANGIOGRAPHY CHEST WITH CONTRAST  TECHNIQUE: Multidetector CT imaging of the chest was performed using the standard protocol during bolus administration of intravenous contrast. Multiplanar CT image reconstructions and MIPs were obtained  to evaluate the vascular anatomy.  CONTRAST:  35m OMNIPAQUE IOHEXOL 350 MG/ML SOLN Contrast extravasation occurred at the end of the injection as patient evaluated by Dr. PPosey Prontoand follow-up instructions provided.  COMPARISON:  02/18/2015 and 01/23/2015  FINDINGS: Left-sided cardiac pacemaker present. Lungs are well inflated without focal consolidation or effusion. Subtle emphysematous disease over the mid to upper lungs. Subtle stable peripheral reticular density over the posterior right upper lobe likely sequelae from patient's previously noted diffuse bilateral reticulonodular process 01/23/2015 which has otherwise completely resolved. Subtle linear hazy density over the posterior left lower lobe likely atelectasis. Complete resolution of the previously noted nodule opacification over the anterior right lung base. Airways are within normal.  Heart is normal in size. There is a small amount of pericardial fluid without significant change. Stent over the left anterior descending coronary artery unchanged. No evidence of mediastinal, hilar or  axillary adenopathy. Mild ectasia of the ascending thoracic aorta measuring 3.4 cm in AP diameter. No evidence of pulmonary embolism.  Possible slightly more prominent 1.4 cm nodular opacity over the upper-outer right breast towards the axilla.  Images through the upper abdomen are unchanged. Remaining bones and soft tissues are within normal.  Review of the MIP images confirms the above findings.  IMPRESSION: Focal linear atelectasis over the posterior left lower lobe. Subtle peripheral reticular density over the posterior right upper lobe likely sequelae from patient's previously seen infectious/inflammatory process. Complete resolution of previously seen nodular opacity over the right base. Mild emphysematous disease.  No evidence pulmonary embolism.  Stable small amount of pericardial fluid.  Cardiac stent.  Possible slightly more prominent nodular opacity over the upper-outer right breast measuring 1.4 cm. Recommend mammographic correlation.  Mild ectasia of the ascending thoracic aorta measuring 3.4 cm unchanged. Recommend annual imaging followup by CTA or MRA. This recommendation follows 2010 ACCF/AHA/AATS/ACR/ASA/SCA/SCAI/SIR/STS/SVM Guidelines for the Diagnosis and Management of Patients with Thoracic Aortic Disease. Circulation.2010; 121:: S962-E366   Electronically Signed   By: DMarin OlpM.D.   On: 06/05/2015 11:18   UKoreaVenous Img Lower Unilateral Left  05/30/2015   CLINICAL DATA:  Left leg pain and swelling for 2 weeks. Partial history of breast carcinoma.  EXAM: LEFT LOWER EXTREMITY VENOUS DOPPLER ULTRASOUND  TECHNIQUE: Gray-scale sonography with graded compression, as well as color Doppler and duplex ultrasound were performed to evaluate the lower extremity deep venous systems from the level of the common femoral vein and including the common femoral, femoral, profunda femoral, popliteal and calf veins including the posterior tibial, peroneal and gastrocnemius veins when visible. The superficial  great saphenous vein was also interrogated. Spectral Doppler was utilized to evaluate flow at rest and with distal augmentation maneuvers in the common femoral, femoral and popliteal veins.  COMPARISON:  None.  FINDINGS: Contralateral Common Femoral Vein: Respiratory phasicity is normal and symmetric with the symptomatic side. No evidence of thrombus. Normal compressibility.  Common Femoral Vein: No evidence of thrombus. Normal compressibility, respiratory phasicity and response to augmentation.  Saphenofemoral Junction: No evidence of thrombus. Normal compressibility and flow on color Doppler imaging.  Profunda Femoral Vein: No evidence of thrombus. Normal compressibility and flow on color Doppler imaging.  Femoral Vein: No evidence of thrombus. Normal compressibility, respiratory phasicity and response to augmentation.  Popliteal Vein: No evidence of thrombus. Normal compressibility, respiratory phasicity and response to augmentation.  Calf Veins: No evidence of thrombus. Normal compressibility and flow on color Doppler imaging.  Superficial Great Saphenous Vein: No evidence of thrombus. Normal compressibility and  flow on color Doppler imaging.  Venous Reflux:  None.  Other Findings:  None.  IMPRESSION: No evidence of deep venous thrombosis.   Electronically Signed   By: Earle Gell M.D.   On: 05/30/2015 16:29   US Venous Img Upper Uni Left  05/30/2015   CLINICAL DATA:  Left lower extremity swelling  EXAM: LEFT LOWER EXTREMITY VENOUS DUPLEX ULTRASOUND  TECHNIQUE: Doppler venous assessment of the left lower extremity deep venous system was performed, including characterization of spectral flow, compressibility, and phasicity.  COMPARISON:  None.  FINDINGS: There is complete compressibility of the left common femoral, femoral, and popliteal veins. Doppler analysis demonstrates respiratory phasicity and augmentation of flow upon calf compression.  IMPRESSION: No evidence of left lower extremity DVT.    Electronically Signed   By: Marybelle Killings M.D.   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 ER/PR positive, HER-2/neu not overexpressing. Original primary was in left breast.  PLAN:    1. Breast cancer: Although patient has a lobular subtype, she will still benefit from palliative chemotherapy. Ca 27.29 is only mildly elevated at 78.9.  Proceed with cycle 4, day 15 of gemcitabine and Taxotere today.  Return to clinic in 4 weeks for consideration of cycle 5, day 1. She will have Neulasta support with ONPRO on day 8 with day 15 off. Will reimage prior to her next treatment. Once patient completes chemotherapy, will consider reinitiating an aromatase inhibitor. 2. Left upper and lower extremity swelling: DVT as well as CT scan are negative for DVT or other acute pathology. Likely secondary to lymphedema from prior surgeries. Continue treatment per lymphedema clinic. 3. Pain: Patient was given a prescription for oxycodone today. 4. Leukocytosis:  Resolved. Likely secondary to Neulasta. 5. Anemia: Secondary to chemotherapy, monitor.   Patient expressed understanding and was in agreement with this plan. She also understands that She can call clinic at any time with any questions, concerns, or complaints.   Lloyd Huger, MD   06/29/2015 11:58 AM

## 2015-07-02 ENCOUNTER — Ambulatory Visit: Payer: Medicaid Other | Admitting: Occupational Therapy

## 2015-07-02 DIAGNOSIS — I890 Lymphedema, not elsewhere classified: Secondary | ICD-10-CM | POA: Diagnosis not present

## 2015-07-02 NOTE — Patient Instructions (Signed)
Same as last time - but did review bandages again with daughters   HEP for ROM fisting to shoulder  Scapula retraction  In supine shoulder exercises

## 2015-07-02 NOTE — Therapy (Signed)
[facility] [address] [place], [address] Phone: [phone]   Fax:  [phone]  Occupational Therapy Treatment  Patient Details  Name: [name] MRN: [id number] Date of Birth: 05/16/1971 Referring Provider:  Lloyd Huger, MD  Encounter Date: 07/02/2015      OT End of Session - 07/02/15 1759    Visit Number 3   Number of Visits 4   Date for OT Re-Evaluation 07/17/15   OT Start Time 9702   OT Stop Time 1616   OT Time Calculation (min) 46 min   Activity Tolerance Patient tolerated treatment well   Behavior During Therapy [facility] for tasks assessed/performed      Past Medical History  Diagnosis Date  . Asthma 2005  . Hypertension 2012  . Personal history of tobacco use, presenting hazards to health   . Myocardial infarction 10/2012  . Cancer 2012    breast cancer, right-treated with neoadjunvant chemotherapy followed by partial mastectomy with sn bx and radiation. Pt is currently taking Tamoxifen since January 2013   . Lump or mass in breast 2012    right  . Malignant neoplasm of upper-outer quadrant of female breast 2012    right  . Breast cancer   . CAD (coronary artery disease)   . MI (myocardial infarction)     Past Surgical History  Procedure Laterality Date  . Portacath placement    . Cesarean section  [id number]  . Tubal ligation  2004  . Breast lumpectomy Right 2012  . Finger surgery Left 1986    reconstruction, left pinky  . Wrist surgery Left 1987  . Coronary angioplasty with stent placement  20123    Myocardial infarction  . Left heart catheterization with coronary angiogram N/A 10/16/2012    Procedure: LEFT HEART CATHETERIZATION WITH CORONARY ANGIOGRAM;  Surgeon: Clent Demark, MD;  Location: [facility] CATH LAB;  Service: Cardiovascular;  Laterality: N/A;    There were no vitals filed for this visit.  Visit Diagnosis:  Lymphedema      Subjective Assessment - 07/02/15  1755    Subjective  They stopped my chemo for month - I am getting to weak - and I had pinch nerve in my neck and could not wear my bandages for 2 days over the weekend - arm feels heavy and my shoulder sore    Patient is accompained by: Family member   Patient Stated Goals Get the swelling down in my L arm and legs - that they do not hurt so much     Currently in Pain? Yes   Pain Score 3    Pain Location Leg   Pain Orientation Left;Right   Pain Type Chronic pain             LYMPHEDEMA/ONCOLOGY QUESTIONNAIRE - 07/02/15 1732    Left Upper Extremity Lymphedema   15 cm Proximal to Olecranon Process 29 cm   10 cm Proximal to Olecranon Process 27.5 cm   Olecranon Process 30.1 cm   15 cm Proximal to Ulnar Styloid Process 28.4 cm   10 cm Proximal to Ulnar Styloid Process 24.4 cm   Just Proximal to Ulnar Styloid Process 18.5 cm   Across Hand at [employer] 20.6 cm   At [place] of 2nd Digit 6.4 cm   At [facility] of Thumb 6.4 cm                 OT Treatments/Exercises (OP) -  07/02/15 0001    Manual Therapy   Manual therapy comments Compression bandages removed and skin care done - wash and Eucerin lotion applied - new isotoner glove , 12 cm stockinette donne , Artiflex  10 cm from hand to elbow, 15 Artiflex from hand to elbow , Rosidal foam from wrist to upper arm ; 6 cm short stretch from hand to mid forearm , 8 cm from hand to elbow and 10cm from mid forearm to upper arm - E tubi grip to keep in place                 OT Education - 07/02/15 1759    Education provided Yes   Education Details HEP, bandaging    Person(s) Educated Patient;Child(ren)   Methods Explanation;Demonstration;Tactile cues;Verbal cues   Comprehension Verbal cues required;Returned demonstration;Verbalized understanding          OT Short Term Goals - 07/02/15 1804    OT SHORT TERM GOAL #1   Title L UE circumference decrease by at least  2 cm at wrist, 5 cm at forearm and elbow and 3 at upper  arm  to be measured for compression garments    Time 2   Period Weeks   Status On-going   OT SHORT TERM GOAL #2   Title Pt and caregivers to be Ind in bandaging and HEP to decrease L UE circumference    Baseline still need education    Time 2   Period Weeks   Status On-going           OT Long Term Goals - 07/02/15 1805    OT LONG TERM GOAL #1   Title Pt to be ind in donning, doffing and wearing compression garments for L UE and bilateral LE to decrease circumference and decrease risk for infection    Time 4   Period Weeks   Status New               Plan - 07/02/15 1801    Clinical Impression Statement Pt's upper arm decrease  and distal forearm - but all the others increase - pt had to take off bandages for 2 days over the weekend - also appear more fibrotic this week - family to cont with bandaging because of medicaid lmitied visits and will try and line up measurements  for garments next week   Pt will benefit from skilled therapeutic intervention in order to improve on the following deficits (Retired) Pain;Decreased skin integrity;Increased edema   Rehab Potential Good   OT Frequency 1x / week   OT Duration 2 weeks   OT Treatment/Interventions Self-care/ADL training;Manual lymph drainage;Compression bandaging;Therapeutic exercise;Patient/family education   Plan Rep plan to measure for garments next week - measure circumference   OT Home Exercise Plan see pt instruction    Consulted and Agree with Plan of Care Patient;Family member/caregiver   Family Member Consulted daughter        Problem List Patient Active Problem List   Diagnosis Date Noted  . Lobular breast cancer 04/09/2015  . Acute respiratory failure with hypoxia 01/24/2015  . Community acquired pneumonia 01/23/2015  . Cancer   . Malignant neoplasm of upper-outer quadrant of female breast   . Myocardial infarction 10/08/2012    Rosalyn Gess OTR/L,CLT 07/02/2015, 6:06 PM  [place] [facility] [address], [place], [address] Phone: [phone]   Fax:  [phone]

## 2015-07-09 ENCOUNTER — Ambulatory Visit: Payer: Medicaid Other | Admitting: Occupational Therapy

## 2015-07-09 DIAGNOSIS — I890 Lymphedema, not elsewhere classified: Secondary | ICD-10-CM

## 2015-07-09 NOTE — Therapy (Signed)
[place] [address], [place], [address] Phone: [phone]   Fax:  [phone]  Occupational Therapy Treatment  Patient Details  Name: [name] MRN: [id number] Date of Birth: 1971/08/13 Referring Provider:  Lloyd Huger, MD  Encounter Date: 07/09/2015      OT End of Session - 07/09/15 1500    Visit Number 4   Number of Visits 4   Date for OT Re-Evaluation 07/17/15   OT Start Time 1135   OT Stop Time 1240   OT Time Calculation (min) 65 min   Activity Tolerance Patient tolerated treatment well   Behavior During Therapy [facility] for tasks assessed/performed      Past Medical History  Diagnosis Date  . Asthma 2005  . Hypertension 2012  . Personal history of tobacco use, presenting hazards to health   . Myocardial infarction 10/2012  . Cancer 2012    breast cancer, right-treated with neoadjunvant chemotherapy followed by partial mastectomy with sn bx and radiation. Pt is currently taking Tamoxifen since January 2013   . Lump or mass in breast 2012    right  . Malignant neoplasm of upper-outer quadrant of female breast 2012    right  . Breast cancer   . CAD (coronary artery disease)   . MI (myocardial infarction)     Past Surgical History  Procedure Laterality Date  . Portacath placement    . Cesarean section  [id number]  . Tubal ligation  2004  . Breast lumpectomy Right 2012  . Finger surgery Left 1986    reconstruction, left pinky  . Wrist surgery Left 1987  . Coronary angioplasty with stent placement  20123    Myocardial infarction  . Left heart catheterization with coronary angiogram N/A 10/16/2012    Procedure: LEFT HEART CATHETERIZATION WITH CORONARY ANGIOGRAM;  Surgeon: Clent Demark, MD;  Location: [facility] CATH LAB;  Service: Cardiovascular;  Laterality: N/A;    There were no vitals filed for this visit.  Visit Diagnosis:  Lymphedema      Subjective Assessment - 07/09/15  1201    Subjective  I tried to keep the bandages on - it did come loose at my hand last night but then I pulled it - my R arm still numb from the pinch nerve - feel little more energy than last week when I come in w/c    Patient Stated Goals Get the swelling down in my L arm and legs - that they do not hurt so much     Currently in Pain? Yes   Pain Score 3    Pain Location Leg             LYMPHEDEMA/ONCOLOGY QUESTIONNAIRE - 07/09/15 1136    Left Upper Extremity Lymphedema   15 cm Proximal to Olecranon Process 27.5 cm   10 cm Proximal to Olecranon Process 26.5 cm   Olecranon Process 27 cm   15 cm Proximal to Ulnar Styloid Process 26.5 cm   10 cm Proximal to Ulnar Styloid Process 24 cm   Just Proximal to Ulnar Styloid Process 18.1 cm   Across Hand at [employer] 19.1 cm   At [place] of 2nd Digit 6.4 cm   At [facility] of Thumb 5.8 cm                 OT Treatments/Exercises (OP) - 07/09/15 0001    ADLs   ADL Comments Daughter ed on bandaging pt's  arm until compression garments come in - discuss with rep garments for pt - comfy Reidsleeve for night time ; medi 55 for daytime and glove - below knee bilateral dynamic     Manual Therapy   Manual therapy comments Compression bandages removed and skin care done - wash and Eucerin lotion applied - new isotoner glove , 12 cm stockinette donne , Artiflex  10 cm from hand to elbow, 15 Artiflex from hand to elbow , Rosidal foam from wrist to upper arm ; 6 cm short stretch from hand to mid forearm , 8 cm from hand to elbow and 10cm from mid forearm to upper arm - E tubi grip to keep in place                 OT Education - 07/09/15 1500    Education provided Yes   Education Details HEP, bandaging   Person(s) Educated Patient;Child(ren)   Methods Explanation;Demonstration;Tactile cues;Verbal cues   Comprehension Verbal cues required;Returned demonstration;Verbalized understanding          OT Short Term Goals - 07/09/15 1504     OT SHORT TERM GOAL #1   Title L UE circumference decrease by at least  2 cm at wrist, 5 cm at forearm and elbow and 3 at upper arm  to be measured for compression garments    Status Partially Met   OT SHORT TERM GOAL #2   Title Pt and caregivers to be Ind in bandaging and HEP to decrease L UE circumference    Status Achieved           OT Long Term Goals - 07/09/15 1505    OT LONG TERM GOAL #1   Title Pt to be ind in donning, doffing and wearing compression garments for L UE and bilateral LE to decrease circumference and decrease risk for infection    Baseline measured today   Status On-going               Plan - 07/09/15 1501    Clinical Impression Statement Pt's measurement decreased since last time and from [facility] but she is sitll increase 4-5 cm at upper arm; 5.6 at elbow ; 6-7 at forearm and 3 at wrist  - but with medicaid approving only 3 visit- pt was measured for compression garments this date - family to bandage her at home until garments come in and will assess fit - pt was increase 11.5 at elbow, 8 at upper arm and 8-10 at forearm at [facility] - pt /rep to phone when  garments arrive    Pt will benefit from skilled therapeutic intervention in order to improve on the following deficits (Retired) Pain;Decreased skin integrity;Increased edema   OT Treatment/Interventions Self-care/ADL training;Manual lymph drainage;Compression bandaging;Therapeutic exercise;Patient/family education   OT Home Exercise Plan see pt instruction    Consulted and Agree with Plan of Care Patient;Family member/caregiver   Family Member Consulted daughter        Problem List Patient Active Problem List   Diagnosis Date Noted  . Lobular breast cancer 04/09/2015  . Acute respiratory failure with hypoxia 01/24/2015  . Community acquired pneumonia 01/23/2015  . Cancer   . Malignant neoplasm of upper-outer quadrant of female breast   . Myocardial infarction 10/08/2012    Rosalyn Gess  OTR/L,CLT 07/09/2015, 3:06 PM  [place] [facility] [address] [place], [address] Phone: [phone]   Fax:  [phone]

## 2015-07-09 NOTE — Patient Instructions (Signed)
Same - cont bandaging until garments come in

## 2015-07-21 ENCOUNTER — Telehealth: Payer: Self-pay | Admitting: Oncology

## 2015-07-21 NOTE — Telephone Encounter (Signed)
Blood in her bowel movement since Saturday. On Saturday just a little bit. Then Sunday no bm and this morning a lot of blood. [phone].

## 2015-07-21 NOTE — Telephone Encounter (Signed)
Spoke to patient and advised going to urgent care or ER regarding bleeding, patient states she does not feel like it is related to hemorrhoids. Patient states she has been on chemotherapy break for one month and feels like her weakness has progressed, legs are weak and states she is having pain. Patient is scheduled for PET scan on Wed. 9/14/, follow up in [place] on Friday 9/16.

## 2015-07-22 ENCOUNTER — Other Ambulatory Visit: Payer: Self-pay | Admitting: Oncology

## 2015-07-22 DIAGNOSIS — C50919 Malignant neoplasm of unspecified site of unspecified female breast: Secondary | ICD-10-CM

## 2015-07-24 ENCOUNTER — Ambulatory Visit
Admission: RE | Admit: 2015-07-24 | Discharge: 2015-07-24 | Disposition: A | Discharge status: Home / Self Care | Payer: Medicaid Other | Source: Ambulatory Visit | Attending: Oncology | Admitting: Oncology

## 2015-07-24 DIAGNOSIS — Z90710 Acquired absence of both cervix and uterus: Secondary | ICD-10-CM | POA: Diagnosis not present

## 2015-07-24 DIAGNOSIS — C50919 Malignant neoplasm of unspecified site of unspecified female breast: Secondary | ICD-10-CM | POA: Insufficient documentation

## 2015-07-24 DIAGNOSIS — J90 Pleural effusion, not elsewhere classified: Secondary | ICD-10-CM | POA: Insufficient documentation

## 2015-07-24 DIAGNOSIS — R188 Other ascites: Secondary | ICD-10-CM | POA: Insufficient documentation

## 2015-07-24 MED ORDER — IOHEXOL 300 MG/ML  SOLN
100.0000 mL | Freq: Once | INTRAMUSCULAR | Status: AC | PRN
  Administered 2015-07-24: 100 mL via INTRAVENOUS

## 2015-07-25 ENCOUNTER — Inpatient Hospital Stay: Payer: Medicaid Other | Attending: Oncology

## 2015-07-25 ENCOUNTER — Ambulatory Visit (HOSPITAL_BASED_OUTPATIENT_CLINIC_OR_DEPARTMENT_OTHER): Payer: Medicaid Other | Admitting: Oncology

## 2015-07-25 ENCOUNTER — Ambulatory Visit: Payer: Medicaid Other

## 2015-07-25 DIAGNOSIS — C7960 Secondary malignant neoplasm of unspecified ovary: Secondary | ICD-10-CM | POA: Insufficient documentation

## 2015-07-25 DIAGNOSIS — C772 Secondary and unspecified malignant neoplasm of intra-abdominal lymph nodes: Secondary | ICD-10-CM | POA: Diagnosis not present

## 2015-07-25 DIAGNOSIS — Z170 Estrogen receptor positive status [ER+]: Secondary | ICD-10-CM | POA: Diagnosis not present

## 2015-07-25 DIAGNOSIS — C50911 Malignant neoplasm of unspecified site of right female breast: Secondary | ICD-10-CM

## 2015-07-25 DIAGNOSIS — Z79899 Other long term (current) drug therapy: Secondary | ICD-10-CM | POA: Diagnosis not present

## 2015-07-25 DIAGNOSIS — C7981 Secondary malignant neoplasm of breast: Secondary | ICD-10-CM | POA: Diagnosis not present

## 2015-07-25 DIAGNOSIS — C50919 Malignant neoplasm of unspecified site of unspecified female breast: Secondary | ICD-10-CM

## 2015-07-25 DIAGNOSIS — M7989 Other specified soft tissue disorders: Secondary | ICD-10-CM | POA: Insufficient documentation

## 2015-07-25 DIAGNOSIS — C7989 Secondary malignant neoplasm of other specified sites: Secondary | ICD-10-CM | POA: Diagnosis not present

## 2015-07-25 LAB — CBC WITH DIFFERENTIAL/PLATELET
BASOS ABS: 0 10*3/uL (ref 0–0.1)
BASOS PCT: 1 %
Eosinophils Absolute: 0 10*3/uL (ref 0–0.7)
Eosinophils Relative: 1 %
HEMATOCRIT: 31.6 % — AB (ref 35.0–47.0)
Hemoglobin: 10.6 g/dL — ABNORMAL LOW (ref 12.0–16.0)
LYMPHS PCT: 11 %
Lymphs Abs: 0.7 10*3/uL — ABNORMAL LOW (ref 1.0–3.6)
MCH: 35.5 pg — ABNORMAL HIGH (ref 26.0–34.0)
MCHC: 33.7 g/dL (ref 32.0–36.0)
MCV: 105.4 fL — ABNORMAL HIGH (ref 80.0–100.0)
MONO ABS: 0.7 10*3/uL (ref 0.2–0.9)
Monocytes Relative: 12 %
NEUTROS ABS: 4.7 10*3/uL (ref 1.4–6.5)
Neutrophils Relative %: 75 %
PLATELETS: 384 10*3/uL (ref 150–440)
RBC: 2.99 MIL/uL — AB (ref 3.80–5.20)
RDW: 20.1 % — AB (ref 11.5–14.5)
WBC: 6.2 10*3/uL (ref 3.6–11.0)

## 2015-07-25 LAB — COMPREHENSIVE METABOLIC PANEL
ALBUMIN: 2.7 g/dL — AB (ref 3.5–5.0)
ALT: 15 U/L (ref 14–54)
AST: 23 U/L (ref 15–41)
Alkaline Phosphatase: 71 U/L (ref 38–126)
Anion gap: 8 (ref 5–15)
BILIRUBIN TOTAL: 0.4 mg/dL (ref 0.3–1.2)
BUN: 6 mg/dL (ref 6–20)
CHLORIDE: 100 mmol/L — AB (ref 101–111)
CO2: 27 mmol/L (ref 22–32)
CREATININE: 0.45 mg/dL (ref 0.44–1.00)
Calcium: 7.9 mg/dL — ABNORMAL LOW (ref 8.9–10.3)
GFR calc Af Amer: >60 mL/min (ref >60)
GFR calc non Af Amer: >60 mL/min (ref >60)
GLUCOSE: 101 mg/dL — AB (ref 65–99)
POTASSIUM: 3.5 mmol/L (ref 3.5–5.1)
Sodium: 135 mmol/L (ref 135–145)
Total Protein: 5.4 g/dL — ABNORMAL LOW (ref 6.5–8.1)

## 2015-07-25 MED ORDER — HEPARIN SOD (PORK) LOCK FLUSH 100 UNIT/ML IV SOLN
500.0000 [IU] | Freq: Once | INTRAVENOUS | Status: AC
  Administered 2015-07-25: 500 [IU] via INTRAVENOUS

## 2015-07-25 MED ORDER — HEPARIN SOD (PORK) LOCK FLUSH 100 UNIT/ML IV SOLN
INTRAVENOUS | Status: AC
  Filled 2015-07-25: qty 5

## 2015-07-25 MED ORDER — SODIUM CHLORIDE 0.9 % IJ SOLN
10.0000 mL | INTRAMUSCULAR | Status: DC | PRN
  Administered 2015-07-25: 10 mL via INTRAVENOUS
  Filled 2015-07-25: qty 10

## 2015-07-26 LAB — CANCER ANTIGEN 27.29: CA 27.29: 37 U/mL (ref 0.0–38.6)

## 2015-07-28 ENCOUNTER — Other Ambulatory Visit: Payer: Self-pay | Admitting: *Deleted

## 2015-07-28 ENCOUNTER — Telehealth: Payer: Self-pay | Admitting: *Deleted

## 2015-07-28 DIAGNOSIS — C50419 Malignant neoplasm of upper-outer quadrant of unspecified female breast: Secondary | ICD-10-CM

## 2015-07-28 MED ORDER — LETROZOLE 2.5 MG PO TABS: 2.5000 mg | ORAL_TABLET | Freq: Every day | ORAL | Status: DC

## 2015-07-28 NOTE — Telephone Encounter (Signed)
Called to report that she was to have Rx sent to pharmacy for after chemo chemo treament and it was not sent in. Needs to be sent to [facility] in [place]

## 2015-07-28 NOTE — Telephone Encounter (Signed)
Letrozole rx e-scribed per Dr. Grayland Ormond.

## 2015-08-03 MED ORDER — LETROZOLE 2.5 MG PO TABS: 2.5000 mg | ORAL_TABLET | Freq: Every day | ORAL | Status: DC

## 2015-08-03 NOTE — Progress Notes (Signed)
[place]  [phone] [phone]  ID: [name] OB: 1971/02/22  MR#: [id number]  RCV#:[id number]  Patient Care Team: [facility] as PCP - General  CHIEF COMPLAINT:  No chief complaint on file.   INTERVAL HISTORY: Patient returns to clinic today for further evaluation and discussion of her imaging results. She continues to have significant lymphedema, but otherwise feels well. She does not complain of pain today. She has no neurologic complaints. She denies any fevers. She denies chest pain or shortness of breath. She denies any nausea, vomiting, constipation, or diarrhea. She has no urinary complaints. Patient offers no further specific complaints.  REVIEW OF SYSTEMS:   Review of Systems  Constitutional: Positive for malaise/fatigue. Negative for fever and weight loss.  Respiratory: Negative.   Cardiovascular: Positive for leg swelling.  Gastrointestinal: Negative.   Musculoskeletal: Negative.   Neurological: Negative.     As per HPI. Otherwise, a complete review of systems is negatve.  PAST MEDICAL HISTORY: Past Medical History  Diagnosis Date  . Asthma 2005  . Hypertension 2012  . Personal history of tobacco use, presenting hazards to health   . Myocardial infarction 10/2012  . Lump or mass in breast 2012    right  . CAD (coronary artery disease)   . MI (myocardial infarction)   . Cancer 2012    breast cancer, right-treated with neoadjunvant chemotherapy followed by partial mastectomy with sn bx and radiation. Pt is currently taking Tamoxifen since January 2013   . Malignant neoplasm of upper-outer quadrant of female breast 2012    right  . Breast cancer     PAST SURGICAL HISTORY: Past Surgical History  Procedure Laterality Date  . Portacath placement    . Cesarean section  [id number]  . Tubal ligation  2004  . Breast lumpectomy Right 2012  . Finger surgery Left 1986    reconstruction, left  pinky  . Wrist surgery Left 1987  . Coronary angioplasty with stent placement  20123    Myocardial infarction  . Left heart catheterization with coronary angiogram N/A 10/16/2012    Procedure: LEFT HEART CATHETERIZATION WITH CORONARY ANGIOGRAM;  Surgeon: Clent Demark, MD;  Location: [facility] CATH LAB;  Service: Cardiovascular;  Laterality: N/A;    FAMILY HISTORY Family History  Problem Relation Age of Onset  . Cancer Maternal Grandmother 69    breast       ADVANCED DIRECTIVES:    HEALTH MAINTENANCE: Social History  Substance Use Topics  . Smoking status: Current Every Day Smoker -- 1.00 packs/day for 15 years    Types: Cigarettes  . Smokeless tobacco: Not on file  . Alcohol Use: No     Colonoscopy:  PAP:  Bone density:  Lipid panel:  Allergies  Allergen Reactions  . Codeine Nausea And Vomiting  . Penicillins Rash    Current Outpatient Prescriptions  Medication Sig Dispense Refill  . albuterol (PROVENTIL HFA;VENTOLIN HFA) 108 (90 BASE) MCG/ACT inhaler Inhale 2 puffs into the lungs every 6 (six) hours as needed for wheezing or shortness of breath. 1 Inhaler 0  . aspirin EC 81 MG EC tablet Take 1 tablet (81 mg total) by mouth daily. 30 tablet 3  . bismuth subsalicylate (PEPTO BISMOL) 262 MG/15ML suspension Take 30 mLs by mouth every 6 (six) hours as needed for indigestion.    . docusate sodium (COLACE) 100 MG capsule Take 100 mg by mouth daily as needed.     Marland Kitchen ibuprofen (ADVIL,MOTRIN)  600 MG tablet Take 600 mg by mouth every 6 (six) hours as needed.    Marland Kitchen letrozole (FEMARA) 2.5 MG tablet Take 1 tablet (2.5 mg total) by mouth daily. 30 tablet 5  . lidocaine-prilocaine (EMLA) cream Apply to affected area once 30 g 3  . lisinopril (PRINIVIL,ZESTRIL) 5 MG tablet Take 1 tablet (5 mg total) by mouth daily. (Patient taking differently: Take 2.5 mg by mouth daily. ) 30 tablet 0  . metoprolol tartrate (LOPRESSOR) 25 MG tablet Take 0.5 tablets (12.5 mg total) by mouth 2 (two) times  daily. 30 tablet 0  . nitroGLYCERIN (NITROSTAT) 0.4 MG SL tablet Place 1 tablet (0.4 mg total) under the tongue every 5 (five) minutes x 3 doses as needed for chest pain. 30 tablet 0  . omeprazole (PRILOSEC) 20 MG capsule Take 1 capsule (20 mg total) by mouth daily. 30 capsule 3  . ondansetron (ZOFRAN) 8 MG tablet Take 1 tablet (8 mg total) by mouth 2 (two) times daily. Start the day after chemo, then as needed for nausea or vomiting. 30 tablet 1  . Oxycodone HCl 10 MG TABS Take 1 tablet (10 mg total) by mouth every 6 (six) hours as needed. 120 tablet 0  . prochlorperazine (COMPAZINE) 10 MG tablet Take 1 tablet (10 mg total) by mouth every 6 (six) hours as needed (Nausea or vomiting). 30 tablet 1   No current facility-administered medications for this visit.   Facility-Administered Medications Ordered in Other Visits  Medication Dose Route Frequency Provider Last Rate Last Dose  . sodium chloride 0.9 % injection 10 mL  10 mL Intracatheter PRN Lloyd Huger, MD   10 mL at 04/25/15 1204    OBJECTIVE: There were no vitals filed for this visit.   There is no weight on file to calculate BMI.    ECOG FS:1 - Symptomatic but completely ambulatory  General: Well-developed, well-nourished, no acute distress. Eyes: anicteric sclera. Breasts: Exam deferred today. Lungs: Clear to auscultation bilaterally. Heart: Regular rate and rhythm. No rubs, murmurs, or gallops. Abdomen: Soft, nontender, nondistended.  Musculoskeletal: Increased swelling of both left lower extremity. Lymphedema wrap in place on left arm. Neuro: Alert, answering all questions appropriately. Cranial nerves grossly intact. Skin: No rashes or petechiae noted. Psych: Normal affect.   LAB RESULTS:  Lab Results  Component Value Date   NA 135 07/25/2015   K 3.5 07/25/2015   CL 100* 07/25/2015   CO2 27 07/25/2015   GLUCOSE 101* 07/25/2015   BUN 6 07/25/2015   CREATININE 0.45 07/25/2015   CALCIUM 7.9* 07/25/2015   PROT  5.4* 07/25/2015   ALBUMIN 2.7* 07/25/2015   AST 23 07/25/2015   ALT 15 07/25/2015   ALKPHOS 71 07/25/2015   BILITOT 0.4 07/25/2015   GFRNONAA >60 07/25/2015   GFRAA >60 07/25/2015    Lab Results  Component Value Date   WBC 6.2 07/25/2015   NEUTROABS 4.7 07/25/2015   HGB 10.6* 07/25/2015   HCT 31.6* 07/25/2015   MCV 105.4* 07/25/2015   PLT 384 07/25/2015     STUDIES: Ct Chest W Contrast  07/25/2015   CLINICAL DATA:  Subsequent encounter for right-sided stage IV lobular breast cancer with ovarian and abdominal metastases.  EXAM: CT CHEST, ABDOMEN, AND PELVIS WITH CONTRAST  TECHNIQUE: Multidetector CT imaging of the chest, abdomen and pelvis was performed following the standard protocol during bolus administration of intravenous contrast.  CONTRAST:  168m OMNIPAQUE IOHEXOL 300 MG/ML  SOLN  COMPARISON:  Chest CT from 06/05/2015.  Abdomen and pelvis CT from 02/19/2015. Chest CT 02/18/2015.  FINDINGS: CT CHEST FINDINGS  Mediastinum / Lymph Nodes: Left Port-A-Cath tip is at the distal SVC level, near the junction with the RA. There is no axillary lymphadenopathy. 13 mm spiculated soft tissue nodule in the lateral aspect of the right breast (image 18 series 2) is stable in the interval. No evidence for supraclavicular or subpectoral lymphadenopathy. No mediastinal lymphadenopathy. There is no hilar lymphadenopathy. The heart size is normal. Slight interval increase in trace pericardial fluid.  Lungs / Pleura: Lung windows show changes of emphysema bilaterally. No focal airspace consolidation. No pulmonary edema. There is some compressive atelectasis in the dependent lung bases bilaterally. Tiny bilateral pleural effusions are evident and new in the interval.  Bone Windows: Bone windows reveal no worrisome lytic or sclerotic osseous lesions.  CT ABDOMEN AND PELVIS FINDINGS  Hepatobiliary: 3 mm hypodensity in the dome of the liver (image 54 series 2) is stable since 02/18/2015. Other scattered tiny  hypodensities in the liver parenchyma are also unchanged. There is no evidence for gallstones, gallbladder wall thickening, or pericholecystic fluid. No intrahepatic or extrahepatic biliary dilation.  Pancreas: No focal mass lesion. No dilatation of the main duct. No intraparenchymal cyst. No peripancreatic edema.  Spleen: 11 mm hypodensity is seen in the medial spleen with at least 2 other similar sized hypodensities elsewhere in the splenic parenchyma. These are new in the interval.  Adrenals/Urinary Tract: No adrenal nodule or mass. Kidneys are unremarkable. No evidence for hydroureter. The urinary bladder appears normal for the degree of distention.  Stomach/Bowel: Stomach is nondistended. No gastric wall thickening. No evidence of outlet obstruction. Duodenum is normally positioned as is the ligament of Treitz. No small bowel wall thickening. No small bowel dilatation. The terminal ileum is normal. The appendix is not visualized, but there is no edema or inflammation in the region of the cecum. No gross colonic mass. No colonic wall thickening. No substantial diverticular change.  Vascular/Lymphatic: There is abdominal aortic atherosclerosis without aneurysm. There is no gastrohepatic or hepatoduodenal ligament lymphadenopathy. No intraperitoneal or retroperitoneal lymphadenopy. No pelvic sidewall lymphadenopathy.  Reproductive: Interval hysterectomy. There is no adnexal mass. The adnexal lesions seen on 02/19/2015 are no longer evident.  Other: Marked interval decrease in ascites comparing back to 02/18/2015 with only trace intraperitoneal free fluid present on the current exam.  Musculoskeletal: Bone windows reveal no worrisome lytic or sclerotic osseous lesions.  IMPRESSION: 1. Interval total abdominal hysterectomy at [facility] on 02/27/2015. 2. Near complete interval resolution of ascites. 3. Spiculated nodule in the lateral aspect of the right breast is stable to slightly smaller on today's exam. 4. Interval  development of 3 hypo attenuating lesions in the splenic parenchyma measuring in the 10-15 mm size range. Metastatic disease cannot be excluded. 5. Interval development of tiny bilateral pleural effusions.   Electronically Signed   By: Misty Stanley M.D.   On: 07/25/2015 10:07   Ct Abdomen Pelvis W Contrast  07/25/2015   CLINICAL DATA:  Subsequent encounter for right-sided stage IV lobular breast cancer with ovarian and abdominal metastases.  EXAM: CT CHEST, ABDOMEN, AND PELVIS WITH CONTRAST  TECHNIQUE: Multidetector CT imaging of the chest, abdomen and pelvis was performed following the standard protocol during bolus administration of intravenous contrast.  CONTRAST:  173m OMNIPAQUE IOHEXOL 300 MG/ML  SOLN  COMPARISON:  Chest CT from 06/05/2015. Abdomen and pelvis CT from 02/19/2015. Chest CT 02/18/2015.  FINDINGS: CT CHEST FINDINGS  Mediastinum / Lymph Nodes: Left  Port-A-Cath tip is at the distal SVC level, near the junction with the RA. There is no axillary lymphadenopathy. 13 mm spiculated soft tissue nodule in the lateral aspect of the right breast (image 18 series 2) is stable in the interval. No evidence for supraclavicular or subpectoral lymphadenopathy. No mediastinal lymphadenopathy. There is no hilar lymphadenopathy. The heart size is normal. Slight interval increase in trace pericardial fluid.  Lungs / Pleura: Lung windows show changes of emphysema bilaterally. No focal airspace consolidation. No pulmonary edema. There is some compressive atelectasis in the dependent lung bases bilaterally. Tiny bilateral pleural effusions are evident and new in the interval.  Bone Windows: Bone windows reveal no worrisome lytic or sclerotic osseous lesions.  CT ABDOMEN AND PELVIS FINDINGS  Hepatobiliary: 3 mm hypodensity in the dome of the liver (image 54 series 2) is stable since 02/18/2015. Other scattered tiny hypodensities in the liver parenchyma are also unchanged. There is no evidence for gallstones,  gallbladder wall thickening, or pericholecystic fluid. No intrahepatic or extrahepatic biliary dilation.  Pancreas: No focal mass lesion. No dilatation of the main duct. No intraparenchymal cyst. No peripancreatic edema.  Spleen: 11 mm hypodensity is seen in the medial spleen with at least 2 other similar sized hypodensities elsewhere in the splenic parenchyma. These are new in the interval.  Adrenals/Urinary Tract: No adrenal nodule or mass. Kidneys are unremarkable. No evidence for hydroureter. The urinary bladder appears normal for the degree of distention.  Stomach/Bowel: Stomach is nondistended. No gastric wall thickening. No evidence of outlet obstruction. Duodenum is normally positioned as is the ligament of Treitz. No small bowel wall thickening. No small bowel dilatation. The terminal ileum is normal. The appendix is not visualized, but there is no edema or inflammation in the region of the cecum. No gross colonic mass. No colonic wall thickening. No substantial diverticular change.  Vascular/Lymphatic: There is abdominal aortic atherosclerosis without aneurysm. There is no gastrohepatic or hepatoduodenal ligament lymphadenopathy. No intraperitoneal or retroperitoneal lymphadenopy. No pelvic sidewall lymphadenopathy.  Reproductive: Interval hysterectomy. There is no adnexal mass. The adnexal lesions seen on 02/19/2015 are no longer evident.  Other: Marked interval decrease in ascites comparing back to 02/18/2015 with only trace intraperitoneal free fluid present on the current exam.  Musculoskeletal: Bone windows reveal no worrisome lytic or sclerotic osseous lesions.  IMPRESSION: 1. Interval total abdominal hysterectomy at [facility] on 02/27/2015. 2. Near complete interval resolution of ascites. 3. Spiculated nodule in the lateral aspect of the right breast is stable to slightly smaller on today's exam. 4. Interval development of 3 hypo attenuating lesions in the splenic parenchyma measuring in the 10-15 mm size  range. Metastatic disease cannot be excluded. 5. Interval development of tiny bilateral pleural effusions.   Electronically Signed   By: Misty Stanley M.D.   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 ER/PR positive, HER-2/neu not overexpressing. Original primary was in left breast.  PLAN:    1. Breast cancer: CT scan results reviewed independently and reported as above with significant improvement of disease burden. Patient's CA-27-29 is also within normal limits at 37.0. Will discontinue chemotherapy at this time and initiate letrozole which patient will require lifelong or until progression of disease. Return to clinic in 6 weeks with repeat laboratory work and further evaluation. 2. Left upper and lower extremity swelling: DVT as well as CT scan are negative for DVT or other acute pathology. Likely secondary to lymphedema from prior surgeries.  Continue treatment per lymphedema clinic. 3. Pain: Continue oxycodone as prescribed.  4. Leukocytosis:  Resolved. 5. Anemia: Patient's hemoglobin is stable.  Secondary to chemotherapy, monitor.  Approximately 30 minutes was spent in discussion and consultation.   Patient expressed understanding and was in agreement with this plan. She also understands that She can call clinic at any time with any questions, concerns, or complaints.   Lloyd Huger, MD   08/03/2015 8:55 AM

## 2015-08-05 ENCOUNTER — Encounter: Payer: Self-pay | Admitting: Occupational Therapy

## 2015-08-05 NOTE — Therapy (Signed)
[place] [facility] [address] [place], [address] Phone: [phone]   Fax:  [phone]  Patient Details  Name: [name] MRN: [id number] Date of Birth: 22-Feb-1971 Referring Provider:  Lloyd Huger, MD  Encounter Date: 08/05/2015  Pt arrive this date with daughters to be fitted for compression garments by Rep - she arrive with her bandages on L UE - after removal appear arm is much smaller than whem measured month ago.  Upon fitting her compression daytime sleeve and glove - rep remeasured her and will order correct size - but pt can use this sleeve and glove in meantime. Night time compression sleeve - optiflow with 2 power sleeves was fitted and left on while rep remeasured her for below knee compression sleeves. When removed pt had great response to compression for night time and can wear that - with one powersleeve for 4-6 months , then the other and then the 2 together. Pt's ankle on the L still increase in circumference. Rep will send this OT the correct size daytime compression sleeve and glove. As well as below knee compression for bilateral LE.  Will contact pt when arrive - or pt to phone me if she has any questions or issues.   Rosalyn Gess OTR/L, CLT 08/05/2015, 12:28 PM  [facility] [place] [facility] [address] [place], [address] Phone: [phone]   Fax:  [phone]

## 2015-09-05 ENCOUNTER — Inpatient Hospital Stay: Payer: Medicaid Other | Attending: Oncology

## 2015-09-05 ENCOUNTER — Inpatient Hospital Stay (HOSPITAL_BASED_OUTPATIENT_CLINIC_OR_DEPARTMENT_OTHER): Payer: Medicaid Other | Admitting: Oncology

## 2015-09-05 VITALS — BP 122/95 | HR 89 | Temp 97.7°F | Resp 16 | Wt 117.9 lb

## 2015-09-05 DIAGNOSIS — C772 Secondary and unspecified malignant neoplasm of intra-abdominal lymph nodes: Secondary | ICD-10-CM | POA: Diagnosis not present

## 2015-09-05 DIAGNOSIS — C7981 Secondary malignant neoplasm of breast: Secondary | ICD-10-CM | POA: Insufficient documentation

## 2015-09-05 DIAGNOSIS — I2510 Atherosclerotic heart disease of native coronary artery without angina pectoris: Secondary | ICD-10-CM | POA: Insufficient documentation

## 2015-09-05 DIAGNOSIS — F17210 Nicotine dependence, cigarettes, uncomplicated: Secondary | ICD-10-CM | POA: Insufficient documentation

## 2015-09-05 DIAGNOSIS — Z170 Estrogen receptor positive status [ER+]: Secondary | ICD-10-CM | POA: Insufficient documentation

## 2015-09-05 DIAGNOSIS — J45909 Unspecified asthma, uncomplicated: Secondary | ICD-10-CM | POA: Insufficient documentation

## 2015-09-05 DIAGNOSIS — C50911 Malignant neoplasm of unspecified site of right female breast: Secondary | ICD-10-CM | POA: Insufficient documentation

## 2015-09-05 DIAGNOSIS — C50919 Malignant neoplasm of unspecified site of unspecified female breast: Secondary | ICD-10-CM

## 2015-09-05 DIAGNOSIS — I10 Essential (primary) hypertension: Secondary | ICD-10-CM | POA: Insufficient documentation

## 2015-09-05 DIAGNOSIS — I890 Lymphedema, not elsewhere classified: Secondary | ICD-10-CM | POA: Diagnosis not present

## 2015-09-05 DIAGNOSIS — M7989 Other specified soft tissue disorders: Secondary | ICD-10-CM | POA: Diagnosis not present

## 2015-09-05 DIAGNOSIS — Z79899 Other long term (current) drug therapy: Secondary | ICD-10-CM | POA: Diagnosis not present

## 2015-09-05 DIAGNOSIS — C7960 Secondary malignant neoplasm of unspecified ovary: Secondary | ICD-10-CM

## 2015-09-05 DIAGNOSIS — C7989 Secondary malignant neoplasm of other specified sites: Secondary | ICD-10-CM | POA: Insufficient documentation

## 2015-09-05 DIAGNOSIS — Z7982 Long term (current) use of aspirin: Secondary | ICD-10-CM | POA: Diagnosis not present

## 2015-09-05 LAB — CBC WITH DIFFERENTIAL/PLATELET
BASOS ABS: 0.1 10*3/uL (ref 0–0.1)
BASOS PCT: 1 %
EOS ABS: 0.1 10*3/uL (ref 0–0.7)
Eosinophils Relative: 1 %
HCT: 40.1 % (ref 35.0–47.0)
HEMOGLOBIN: 13.6 g/dL (ref 12.0–16.0)
LYMPHS ABS: 1.3 10*3/uL (ref 1.0–3.6)
Lymphocytes Relative: 22 %
MCH: 33.2 pg (ref 26.0–34.0)
MCHC: 33.8 g/dL (ref 32.0–36.0)
MCV: 98.2 fL (ref 80.0–100.0)
Monocytes Absolute: 0.4 10*3/uL (ref 0.2–0.9)
Monocytes Relative: 6 %
NEUTROS PCT: 70 %
Neutro Abs: 4.1 10*3/uL (ref 1.4–6.5)
Platelets: 249 10*3/uL (ref 150–440)
RBC: 4.09 MIL/uL (ref 3.80–5.20)
RDW: 14.7 % — ABNORMAL HIGH (ref 11.5–14.5)
WBC: 5.9 10*3/uL (ref 3.6–11.0)

## 2015-09-05 LAB — COMPREHENSIVE METABOLIC PANEL
ALBUMIN: 3.5 g/dL (ref 3.5–5.0)
ALK PHOS: 57 U/L (ref 38–126)
ALT: 14 U/L (ref 14–54)
AST: 26 U/L (ref 15–41)
Anion gap: 10 (ref 5–15)
BUN: 8 mg/dL (ref 6–20)
CALCIUM: 8.6 mg/dL — AB (ref 8.9–10.3)
CHLORIDE: 103 mmol/L (ref 101–111)
CO2: 25 mmol/L (ref 22–32)
CREATININE: 0.56 mg/dL (ref 0.44–1.00)
GFR calc Af Amer: >60 mL/min (ref >60)
GFR calc non Af Amer: >60 mL/min (ref >60)
GLUCOSE: 118 mg/dL — AB (ref 65–99)
Potassium: 3.6 mmol/L (ref 3.5–5.1)
SODIUM: 138 mmol/L (ref 135–145)
Total Bilirubin: 0.3 mg/dL (ref 0.3–1.2)
Total Protein: 6.3 g/dL — ABNORMAL LOW (ref 6.5–8.1)

## 2015-09-05 MED ORDER — SODIUM CHLORIDE 0.9 % IJ SOLN
10.0000 mL | INTRAMUSCULAR | Status: DC | PRN
  Administered 2015-09-05: 10 mL via INTRAVENOUS
  Filled 2015-09-05: qty 10

## 2015-09-05 MED ORDER — HEPARIN SOD (PORK) LOCK FLUSH 100 UNIT/ML IV SOLN
500.0000 [IU] | Freq: Once | INTRAVENOUS | Status: AC
  Administered 2015-09-05: 500 [IU] via INTRAVENOUS

## 2015-09-05 MED ORDER — HEPARIN SOD (PORK) LOCK FLUSH 100 UNIT/ML IV SOLN
INTRAVENOUS | Status: AC
  Filled 2015-09-05: qty 5

## 2015-09-05 NOTE — Progress Notes (Signed)
Patient is feeling much better since finishing chemotherapy and her leg edema has improved.  Still has left arm lymphodema that she does wear a sleeve.

## 2015-09-06 LAB — CA 125: CA 125: 9.3 U/mL (ref 0.0–38.1)

## 2015-09-06 LAB — CANCER ANTIGEN 27.29: CA 27.29: 27.3 U/mL (ref 0.0–38.6)

## 2015-09-14 NOTE — Progress Notes (Signed)
[name]  [phone] Fax:(336) [phone]  ID: [name] OB: Jan 09, 1971  MR#: [id number]  AOZ#:[id number]  Patient Care Team: [facility] as PCP - General  CHIEF COMPLAINT:  Chief Complaint  Patient presents with  . Breast Cancer    INTERVAL HISTORY: Patient returns to clinic today for repeat laboratory work and further evaluation. She feels significantly improved since discontinuing chemotherapy. Her leg edema has also improved, but she still has left arm lymphedema. She does not complain of pain today. She has no neurologic complaints. She denies any fevers. She denies chest pain or shortness of breath. She denies any nausea, vomiting, constipation, or diarrhea. She has no urinary complaints. Patient offers no further specific complaints.  REVIEW OF SYSTEMS:   Review of Systems  Constitutional: Positive for malaise/fatigue. Negative for fever and weight loss.  Respiratory: Negative.   Cardiovascular: Negative.  Negative for leg swelling.  Gastrointestinal: Negative.   Genitourinary: Negative.   Musculoskeletal: Negative.   Neurological: Positive for weakness.    As per HPI. Otherwise, a complete review of systems is negatve.  PAST MEDICAL HISTORY: Past Medical History  Diagnosis Date  . Asthma 2005  . Hypertension 2012  . Personal history of tobacco use, presenting hazards to health   . Myocardial infarction ([name]) 10/2012  . Lump or mass in breast 2012    right  . CAD (coronary artery disease)   . MI (myocardial infarction)   . Cancer 2012    breast cancer, right-treated with neoadjunvant chemotherapy followed by partial mastectomy with sn bx and radiation. Pt is currently taking Tamoxifen since January 2013   . Malignant neoplasm of upper-outer quadrant of female breast 2012    right  . Breast cancer     PAST SURGICAL HISTORY: Past Surgical History  Procedure Laterality Date  . Portacath placement      . Cesarean section  [id number]  . Tubal ligation  2004  . Breast lumpectomy Right 2012  . Finger surgery Left 1986    reconstruction, left pinky  . Wrist surgery Left 1987  . Coronary angioplasty with stent placement  20123    Myocardial infarction  . Left heart catheterization with coronary angiogram N/A 10/16/2012    Procedure: LEFT HEART CATHETERIZATION WITH CORONARY ANGIOGRAM;  Surgeon: Clent Demark, MD;  Location: [facility] CATH LAB;  Service: Cardiovascular;  Laterality: N/A;    FAMILY HISTORY Family History  Problem Relation Age of Onset  . Cancer Maternal Grandmother 69    breast       ADVANCED DIRECTIVES:    HEALTH MAINTENANCE: Social History  Substance Use Topics  . Smoking status: Current Every Day Smoker -- 1.00 packs/day for 15 years    Types: Cigarettes  . Smokeless tobacco: Not on file  . Alcohol Use: No     Colonoscopy:  PAP:  Bone density:  Lipid panel:  Allergies  Allergen Reactions  . Codeine Nausea And Vomiting  . Penicillins Rash    Current Outpatient Prescriptions  Medication Sig Dispense Refill  . albuterol (PROVENTIL HFA;VENTOLIN HFA) 108 (90 BASE) MCG/ACT inhaler Inhale 2 puffs into the lungs every 6 (six) hours as needed for wheezing or shortness of breath. 1 Inhaler 0  . aspirin EC 81 MG EC tablet Take 1 tablet (81 mg total) by mouth daily. 30 tablet 3  . bismuth subsalicylate (PEPTO BISMOL) 262 MG/15ML suspension Take 30 mLs by mouth every 6 (six) hours as needed for indigestion.    Marland Kitchen  docusate sodium (COLACE) 100 MG capsule Take 100 mg by mouth daily as needed.     Marland Kitchen ibuprofen (ADVIL,MOTRIN) 600 MG tablet Take 600 mg by mouth every 6 (six) hours as needed.    Marland Kitchen letrozole (FEMARA) 2.5 MG tablet Take 1 tablet (2.5 mg total) by mouth daily. 30 tablet 5  . lidocaine-prilocaine (EMLA) cream Apply to affected area once 30 g 3  . lisinopril (PRINIVIL,ZESTRIL) 5 MG tablet Take 1 tablet (5 mg total) by mouth daily. (Patient taking differently: Take  2.5 mg by mouth daily. ) 30 tablet 0  . metoprolol tartrate (LOPRESSOR) 25 MG tablet Take 0.5 tablets (12.5 mg total) by mouth 2 (two) times daily. 30 tablet 0  . nitroGLYCERIN (NITROSTAT) 0.4 MG SL tablet Place 1 tablet (0.4 mg total) under the tongue every 5 (five) minutes x 3 doses as needed for chest pain. 30 tablet 0  . omeprazole (PRILOSEC) 20 MG capsule Take 1 capsule (20 mg total) by mouth daily. 30 capsule 3  . ondansetron (ZOFRAN) 8 MG tablet Take 1 tablet (8 mg total) by mouth 2 (two) times daily. Start the day after chemo, then as needed for nausea or vomiting. 30 tablet 1  . prochlorperazine (COMPAZINE) 10 MG tablet Take 1 tablet (10 mg total) by mouth every 6 (six) hours as needed (Nausea or vomiting). 30 tablet 1   No current facility-administered medications for this visit.   Facility-Administered Medications Ordered in Other Visits  Medication Dose Route Frequency Provider Last Rate Last Dose  . sodium chloride 0.9 % injection 10 mL  10 mL Intracatheter PRN Lloyd Huger, MD   10 mL at 04/25/15 1204    OBJECTIVE: Filed Vitals:   09/05/15 1148  BP: 122/95  Pulse: 89  Temp: 97.7 F (36.5 C)  Resp: 16     Body mass index is 21.7 kg/(m^2).    ECOG FS:1 - Symptomatic but completely ambulatory  General: Well-developed, well-nourished, no acute distress. Eyes: anicteric sclera. Breasts: Exam deferred today. Lungs: Clear to auscultation bilaterally. Heart: Regular rate and rhythm. No rubs, murmurs, or gallops. Abdomen: Soft, nontender, nondistended.  Musculoskeletal: Minimal lower extremity edema. Lymphedema wrap in place on left arm. Neuro: Alert, answering all questions appropriately. Cranial nerves grossly intact. Skin: No rashes or petechiae noted. Psych: Normal affect.   LAB RESULTS:  Lab Results  Component Value Date   NA 138 09/05/2015   K 3.6 09/05/2015   CL 103 09/05/2015   CO2 25 09/05/2015   GLUCOSE 118* 09/05/2015   BUN 8 09/05/2015   CREATININE  0.56 09/05/2015   CALCIUM 8.6* 09/05/2015   PROT 6.3* 09/05/2015   ALBUMIN 3.5 09/05/2015   AST 26 09/05/2015   ALT 14 09/05/2015   ALKPHOS 57 09/05/2015   BILITOT 0.3 09/05/2015   GFRNONAA >60 09/05/2015   GFRAA >60 09/05/2015    Lab Results  Component Value Date   WBC 5.9 09/05/2015   NEUTROABS 4.1 09/05/2015   HGB 13.6 09/05/2015   HCT 40.1 09/05/2015   MCV 98.2 09/05/2015   PLT 249 09/05/2015     STUDIES: No results found.  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 ER/PR positive, HER-2/neu not overexpressing. Original primary was in left breast.  PLAN:    1. Breast cancer: CT scan results from July 24, 2015 reviewed independently with significant improvement of disease burden. Patient's CA-27-29 and CA 125 are within normal limits. Continue letrozole which patient will require lifelong or until progression  of disease. Return to clinic in 6 weeks with repeat laboratory work, additional imaging, and further evaluation. 2. Left upper extremity swelling: Ultrasound as well as CT scan were negative for DVT or other acute pathology. Likely secondary to lymphedema from prior surgeries. Continue treatment per lymphedema clinic. 3. Pain: Continue oxycodone as prescribed.  4. Leukocytosis:  Resolved. 5. Anemia: Patient's hemoglobin is now within normal limits.  Patient expressed understanding and was in agreement with this plan. She also understands that She can call clinic at any time with any questions, concerns, or complaints.   Lloyd Huger, MD   09/14/2015 9:39 AM

## 2015-10-13 ENCOUNTER — Other Ambulatory Visit: Payer: Self-pay | Admitting: *Deleted

## 2015-10-13 ENCOUNTER — Inpatient Hospital Stay: Payer: Medicaid Other

## 2015-10-13 ENCOUNTER — Ambulatory Visit: Payer: Medicaid Other

## 2015-10-13 ENCOUNTER — Telehealth: Payer: Self-pay | Admitting: Oncology

## 2015-10-13 DIAGNOSIS — C50419 Malignant neoplasm of upper-outer quadrant of unspecified female breast: Secondary | ICD-10-CM

## 2015-10-13 MED ORDER — CYCLOBENZAPRINE HCL 10 MG PO TABS: 10.0000 mg | ORAL_TABLET | Freq: Three times a day (TID) | ORAL | Status: DC | PRN

## 2015-10-13 MED ORDER — LETROZOLE 2.5 MG PO TABS: 2.5000 mg | ORAL_TABLET | Freq: Every day | ORAL | Status: DC

## 2015-10-13 NOTE — Telephone Encounter (Signed)
She said she needs a refill on her cancer pills and some muscle relaxant called in to the [place].  Thanks.

## 2015-10-13 NOTE — Telephone Encounter (Signed)
I spoke with patient and she has taken Flexeril in the past with relief. RX for Flexeril 10 mg PO TID PRN sent to patients pharmacy.

## 2015-10-13 NOTE — Telephone Encounter (Signed)
If she needs one, i'm ok with that.

## 2015-10-13 NOTE — Telephone Encounter (Signed)
Refill sent for Letrozole, I do not see that patient is on muscle relaxer. Would you like to prescribe something? Thanks.

## 2015-10-17 ENCOUNTER — Inpatient Hospital Stay: Payer: Medicaid Other

## 2015-10-17 ENCOUNTER — Ambulatory Visit: Payer: Medicaid Other | Admitting: Oncology

## 2015-10-20 ENCOUNTER — Inpatient Hospital Stay: Payer: Medicaid Other | Attending: Oncology

## 2015-10-20 ENCOUNTER — Inpatient Hospital Stay: Payer: Medicaid Other

## 2015-10-20 ENCOUNTER — Ambulatory Visit
Admission: RE | Admit: 2015-10-20 | Discharge: 2015-10-20 | Disposition: A | Discharge status: Home / Self Care | Payer: Medicaid Other | Source: Ambulatory Visit | Attending: Oncology | Admitting: Oncology

## 2015-10-20 DIAGNOSIS — F17210 Nicotine dependence, cigarettes, uncomplicated: Secondary | ICD-10-CM | POA: Diagnosis not present

## 2015-10-20 DIAGNOSIS — Z79810 Long term (current) use of selective estrogen receptor modulators (SERMs): Secondary | ICD-10-CM | POA: Diagnosis not present

## 2015-10-20 DIAGNOSIS — I10 Essential (primary) hypertension: Secondary | ICD-10-CM | POA: Insufficient documentation

## 2015-10-20 DIAGNOSIS — Z79899 Other long term (current) drug therapy: Secondary | ICD-10-CM | POA: Insufficient documentation

## 2015-10-20 DIAGNOSIS — Z923 Personal history of irradiation: Secondary | ICD-10-CM | POA: Diagnosis not present

## 2015-10-20 DIAGNOSIS — Z7982 Long term (current) use of aspirin: Secondary | ICD-10-CM | POA: Insufficient documentation

## 2015-10-20 DIAGNOSIS — Z170 Estrogen receptor positive status [ER+]: Secondary | ICD-10-CM | POA: Diagnosis not present

## 2015-10-20 DIAGNOSIS — C50411 Malignant neoplasm of upper-outer quadrant of right female breast: Secondary | ICD-10-CM | POA: Insufficient documentation

## 2015-10-20 DIAGNOSIS — I890 Lymphedema, not elsewhere classified: Secondary | ICD-10-CM | POA: Insufficient documentation

## 2015-10-20 DIAGNOSIS — C50919 Malignant neoplasm of unspecified site of unspecified female breast: Secondary | ICD-10-CM | POA: Diagnosis present

## 2015-10-20 DIAGNOSIS — I252 Old myocardial infarction: Secondary | ICD-10-CM | POA: Diagnosis not present

## 2015-10-20 DIAGNOSIS — Z9011 Acquired absence of right breast and nipple: Secondary | ICD-10-CM | POA: Diagnosis not present

## 2015-10-20 DIAGNOSIS — J45909 Unspecified asthma, uncomplicated: Secondary | ICD-10-CM | POA: Insufficient documentation

## 2015-10-20 DIAGNOSIS — M7989 Other specified soft tissue disorders: Secondary | ICD-10-CM | POA: Insufficient documentation

## 2015-10-20 DIAGNOSIS — I2510 Atherosclerotic heart disease of native coronary artery without angina pectoris: Secondary | ICD-10-CM | POA: Diagnosis not present

## 2015-10-20 LAB — CBC WITH DIFFERENTIAL/PLATELET
BASOS ABS: 0.1 10*3/uL (ref 0–0.1)
Basophils Relative: 1 %
Eosinophils Absolute: 0.1 10*3/uL (ref 0–0.7)
Eosinophils Relative: 1 %
HEMATOCRIT: 42 % (ref 35.0–47.0)
Hemoglobin: 14.6 g/dL (ref 12.0–16.0)
LYMPHS PCT: 22 %
Lymphs Abs: 1.4 10*3/uL (ref 1.0–3.6)
MCH: 31.9 pg (ref 26.0–34.0)
MCHC: 34.7 g/dL (ref 32.0–36.0)
MCV: 91.9 fL (ref 80.0–100.0)
MONO ABS: 0.4 10*3/uL (ref 0.2–0.9)
Monocytes Relative: 7 %
NEUTROS ABS: 4.4 10*3/uL (ref 1.4–6.5)
Neutrophils Relative %: 69 %
Platelets: 256 10*3/uL (ref 150–440)
RBC: 4.57 MIL/uL (ref 3.80–5.20)
RDW: 14.4 % (ref 11.5–14.5)
WBC: 6.5 10*3/uL (ref 3.6–11.0)

## 2015-10-20 LAB — COMPREHENSIVE METABOLIC PANEL
ALT: 14 U/L (ref 14–54)
AST: 18 U/L (ref 15–41)
Albumin: 3.9 g/dL (ref 3.5–5.0)
Alkaline Phosphatase: 69 U/L (ref 38–126)
Anion gap: 8 (ref 5–15)
BILIRUBIN TOTAL: 0.5 mg/dL (ref 0.3–1.2)
BUN: 11 mg/dL (ref 6–20)
CO2: 26 mmol/L (ref 22–32)
CREATININE: 0.57 mg/dL (ref 0.44–1.00)
Calcium: 8.7 mg/dL — ABNORMAL LOW (ref 8.9–10.3)
Chloride: 100 mmol/L — ABNORMAL LOW (ref 101–111)
GFR calc Af Amer: >60 mL/min (ref >60)
Glucose, Bld: 90 mg/dL (ref 65–99)
POTASSIUM: 4.3 mmol/L (ref 3.5–5.1)
Sodium: 134 mmol/L — ABNORMAL LOW (ref 135–145)
TOTAL PROTEIN: 7.1 g/dL (ref 6.5–8.1)

## 2015-10-20 LAB — MAGNESIUM: MAGNESIUM: 1.9 mg/dL (ref 1.7–2.4)

## 2015-10-20 MED ORDER — HEPARIN SOD (PORK) LOCK FLUSH 100 UNIT/ML IV SOLN
500.0000 [IU] | Freq: Once | INTRAVENOUS | Status: AC
  Administered 2015-10-20: 500 [IU] via INTRAVENOUS

## 2015-10-20 MED ORDER — HEPARIN SOD (PORK) LOCK FLUSH 100 UNIT/ML IV SOLN
INTRAVENOUS | Status: AC
  Filled 2015-10-20: qty 5

## 2015-10-20 MED ORDER — SODIUM CHLORIDE 0.9 % IJ SOLN
10.0000 mL | INTRAMUSCULAR | Status: DC | PRN
  Administered 2015-10-20: 10 mL via INTRAVENOUS
  Filled 2015-10-20: qty 10

## 2015-10-20 MED ORDER — IOHEXOL 300 MG/ML  SOLN
125.0000 mL | Freq: Once | INTRAMUSCULAR | Status: AC | PRN
  Administered 2015-10-20: 125 mL via INTRAVENOUS

## 2015-10-21 LAB — CANCER ANTIGEN 27.29: CA 27.29: 35 U/mL (ref 0.0–38.6)

## 2015-10-24 ENCOUNTER — Inpatient Hospital Stay (HOSPITAL_BASED_OUTPATIENT_CLINIC_OR_DEPARTMENT_OTHER): Payer: Medicaid Other | Admitting: Oncology

## 2015-10-24 VITALS — BP 136/86 | HR 85 | Temp 99.2°F | Resp 16 | Wt 119.0 lb

## 2015-10-24 DIAGNOSIS — Z79810 Long term (current) use of selective estrogen receptor modulators (SERMs): Secondary | ICD-10-CM

## 2015-10-24 DIAGNOSIS — M7989 Other specified soft tissue disorders: Secondary | ICD-10-CM

## 2015-10-24 DIAGNOSIS — I890 Lymphedema, not elsewhere classified: Secondary | ICD-10-CM | POA: Diagnosis not present

## 2015-10-24 DIAGNOSIS — Z170 Estrogen receptor positive status [ER+]: Secondary | ICD-10-CM | POA: Diagnosis not present

## 2015-10-24 DIAGNOSIS — C50411 Malignant neoplasm of upper-outer quadrant of right female breast: Secondary | ICD-10-CM

## 2015-10-24 DIAGNOSIS — Z9011 Acquired absence of right breast and nipple: Secondary | ICD-10-CM

## 2015-10-24 DIAGNOSIS — Z79899 Other long term (current) drug therapy: Secondary | ICD-10-CM

## 2015-10-24 DIAGNOSIS — Z923 Personal history of irradiation: Secondary | ICD-10-CM

## 2015-10-24 NOTE — Progress Notes (Signed)
[facility]  Telephone:(336) [phone] Fax:(336) [phone]  ID: SWARA DONZE OB: 1971-03-22  MR#: [id number]  WUX#:[id number]  Patient Care Team: [facility] as PCP - General  CHIEF COMPLAINT:  Chief Complaint  Patient presents with  . Breast Cancer    INTERVAL HISTORY: Patient returns to clinic today for further evaluation and discussion of her imaging results. She currently feels well and is asymptomatic. Her leg edema has also improved, but she still has left arm lymphedema. She does not complain of pain today. She has no neurologic complaints. She denies any fevers. She denies chest pain or shortness of breath. She denies any nausea, vomiting, constipation, or diarrhea. She has no urinary complaints. Patient offers no further specific complaints.  REVIEW OF SYSTEMS:   Review of Systems  Constitutional: Positive for malaise/fatigue. Negative for fever and weight loss.  Respiratory: Negative.   Cardiovascular: Negative.  Negative for leg swelling.  Gastrointestinal: Negative.   Genitourinary: Negative.   Musculoskeletal: Negative.   Neurological: Positive for weakness.    As per HPI. Otherwise, a complete review of systems is negatve.  PAST MEDICAL HISTORY: Past Medical History  Diagnosis Date  . Asthma 2005  . Hypertension 2012  . Personal history of tobacco use, presenting hazards to health   . Myocardial infarction ([place]) 10/2012  . Lump or mass in breast 2012    right  . CAD (coronary artery disease)   . MI (myocardial infarction)   . Cancer 2012    breast cancer, right-treated with neoadjunvant chemotherapy followed by partial mastectomy with sn bx and radiation. Pt is currently taking Tamoxifen since January 2013   . Malignant neoplasm of upper-outer quadrant of female breast 2012    right  . Breast cancer     PAST SURGICAL HISTORY: Past Surgical History  Procedure Laterality Date  . Portacath placement    .  Cesarean section  [id number]  . Tubal ligation  2004  . Breast lumpectomy Right 2012  . Finger surgery Left 1986    reconstruction, left pinky  . Wrist surgery Left 1987  . Coronary angioplasty with stent placement  20123    Myocardial infarction  . Left heart catheterization with coronary angiogram N/A 10/16/2012    Procedure: LEFT HEART CATHETERIZATION WITH CORONARY ANGIOGRAM;  Surgeon: Clent Demark, MD;  Location: [facility] CATH LAB;  Service: Cardiovascular;  Laterality: N/A;    FAMILY HISTORY Family History  Problem Relation Age of Onset  . Cancer Maternal Grandmother 69    breast       ADVANCED DIRECTIVES:    HEALTH MAINTENANCE: Social History  Substance Use Topics  . Smoking status: Current Every Day Smoker -- 1.00 packs/day for 15 years    Types: Cigarettes  . Smokeless tobacco: Not on file  . Alcohol Use: No     Colonoscopy:  PAP:  Bone density:  Lipid panel:  Allergies  Allergen Reactions  . Codeine Nausea And Vomiting  . Penicillins Rash    Current Outpatient Prescriptions  Medication Sig Dispense Refill  . albuterol (PROVENTIL HFA;VENTOLIN HFA) 108 (90 BASE) MCG/ACT inhaler Inhale 2 puffs into the lungs every 6 (six) hours as needed for wheezing or shortness of breath. 1 Inhaler 0  . aspirin EC 81 MG EC tablet Take 1 tablet (81 mg total) by mouth daily. 30 tablet 3  . bismuth subsalicylate (PEPTO BISMOL) 262 MG/15ML suspension Take 30 mLs by mouth every 6 (six) hours as needed for indigestion.    Marland Kitchen  cyclobenzaprine (FLEXERIL) 10 MG tablet Take 1 tablet (10 mg total) by mouth 3 (three) times daily as needed for muscle spasms. 30 tablet 0  . docusate sodium (COLACE) 100 MG capsule Take 100 mg by mouth daily as needed.     Marland Kitchen ibuprofen (ADVIL,MOTRIN) 600 MG tablet Take 600 mg by mouth every 6 (six) hours as needed.    Marland Kitchen letrozole (FEMARA) 2.5 MG tablet Take 1 tablet (2.5 mg total) by mouth daily. 30 tablet 5  . lisinopril (PRINIVIL,ZESTRIL) 5 MG tablet Take 1  tablet (5 mg total) by mouth daily. (Patient taking differently: Take 2.5 mg by mouth daily. ) 30 tablet 0  . metoprolol tartrate (LOPRESSOR) 25 MG tablet Take 0.5 tablets (12.5 mg total) by mouth 2 (two) times daily. 30 tablet 0  . nitroGLYCERIN (NITROSTAT) 0.4 MG SL tablet Place 1 tablet (0.4 mg total) under the tongue every 5 (five) minutes x 3 doses as needed for chest pain. 30 tablet 0  . omeprazole (PRILOSEC) 20 MG capsule Take 1 capsule (20 mg total) by mouth daily. 30 capsule 3  . ondansetron (ZOFRAN) 8 MG tablet Take 1 tablet (8 mg total) by mouth 2 (two) times daily. Start the day after chemo, then as needed for nausea or vomiting. 30 tablet 1  . prochlorperazine (COMPAZINE) 10 MG tablet Take 1 tablet (10 mg total) by mouth every 6 (six) hours as needed (Nausea or vomiting). 30 tablet 1  . lidocaine-prilocaine (EMLA) cream Apply to affected area once (Patient not taking: Reported on 10/24/2015) 30 g 3   No current facility-administered medications for this visit.   Facility-Administered Medications Ordered in Other Visits  Medication Dose Route Frequency Provider Last Rate Last Dose  . sodium chloride 0.9 % injection 10 mL  10 mL Intracatheter PRN Lloyd Huger, MD   10 mL at 04/25/15 1204    OBJECTIVE: Filed Vitals:   10/24/15 1050  BP: 136/86  Pulse: 85  Temp: 99.2 F (37.3 C)  Resp: 16     Body mass index is 21.91 kg/(m^2).    ECOG FS:1 - Symptomatic but completely ambulatory  General: Well-developed, well-nourished, no acute distress. Eyes: anicteric sclera. Breasts: Exam deferred today. Lungs: Clear to auscultation bilaterally. Heart: Regular rate and rhythm. No rubs, murmurs, or gallops. Abdomen: Soft, nontender, nondistended.  Musculoskeletal: Minimal lower extremity edema. Lymphedema wrap in place on left arm. Neuro: Alert, answering all questions appropriately. Cranial nerves grossly intact. Skin: No rashes or petechiae noted. Psych: Normal affect.   LAB  RESULTS:  Lab Results  Component Value Date   NA 134* 10/20/2015   K 4.3 10/20/2015   CL 100* 10/20/2015   CO2 26 10/20/2015   GLUCOSE 90 10/20/2015   BUN 11 10/20/2015   CREATININE 0.57 10/20/2015   CALCIUM 8.7* 10/20/2015   PROT 7.1 10/20/2015   ALBUMIN 3.9 10/20/2015   AST 18 10/20/2015   ALT 14 10/20/2015   ALKPHOS 69 10/20/2015   BILITOT 0.5 10/20/2015   GFRNONAA >60 10/20/2015   GFRAA >60 10/20/2015    Lab Results  Component Value Date   WBC 6.5 10/20/2015   NEUTROABS 4.4 10/20/2015   HGB 14.6 10/20/2015   HCT 42.0 10/20/2015   MCV 91.9 10/20/2015   PLT 256 10/20/2015    STUDIES: Ct Chest W Contrast  10/20/2015  CLINICAL DATA:  Followup metastatic breast carcinoma.  Restaging. EXAM: CT CHEST, ABDOMEN, AND PELVIS WITH CONTRAST TECHNIQUE: Multidetector CT imaging of the chest, abdomen and pelvis was performed  following the standard protocol during bolus administration of intravenous contrast. CONTRAST:  177m OMNIPAQUE IOHEXOL 300 MG/ML  SOLN COMPARISON:  07/24/2015 FINDINGS: CT CHEST FINDINGS Mediastinum/Lymph Nodes: No masses, pathologically enlarged lymph nodes, or other significant abnormality. Lungs/Pleura: No pulmonary mass, infiltrate, or effusion. Previously seen small bilateral pleural effusions have resolved. Moderate emphysema again noted. Musculoskeletal: 1.3 cm irregular soft tissue nodule in lateral right breast on image 21/series 2 remains stable. No other chest wall mass identified. No suspicious bone lesions identified. CT ABDOMEN PELVIS FINDINGS Hepatobiliary: Probable tiny sub-cm hepatic cyst remains stable. No masses or other significant abnormality. Pancreas: No mass, inflammatory changes, or other significant abnormality. Spleen: No evidence of splenomegaly. Several small low-attenuation splenic lesions measuring approximately 1 cm remains stable. Adrenals/Urinary Tract: No masses identified. No evidence of hydronephrosis. Stomach/Bowel: No evidence of  obstruction, inflammatory process, or abnormal fluid collections. Vascular/Lymphatic: No pathologically enlarged lymph nodes. No evidence of abdominal aortic aneursym. Reproductive: Prior hysterectomy noted. Adnexal regions are unremarkable in appearance. Other: There has been near complete resolution of ascites and diffuse body wall edema since previous study. Musculoskeletal:  No suspicious bone lesions identified. IMPRESSION: Stable 1.3 cm irregular soft tissue nodule in the lateral right breast. Stable tiny indeterminate low-attenuation splenic lesions measuring up to 1 cm. No new or progressive disease identified within the chest, abdomen, or pelvis. Electronically Signed   By: JEarle GellM.D.   On: 10/20/2015 12:15   Ct Abdomen Pelvis W Contrast  10/20/2015  CLINICAL DATA:  Followup metastatic breast carcinoma.  Restaging. EXAM: CT CHEST, ABDOMEN, AND PELVIS WITH CONTRAST TECHNIQUE: Multidetector CT imaging of the chest, abdomen and pelvis was performed following the standard protocol during bolus administration of intravenous contrast. CONTRAST:  1293mOMNIPAQUE IOHEXOL 300 MG/ML  SOLN COMPARISON:  07/24/2015 FINDINGS: CT CHEST FINDINGS Mediastinum/Lymph Nodes: No masses, pathologically enlarged lymph nodes, or other significant abnormality. Lungs/Pleura: No pulmonary mass, infiltrate, or effusion. Previously seen small bilateral pleural effusions have resolved. Moderate emphysema again noted. Musculoskeletal: 1.3 cm irregular soft tissue nodule in lateral right breast on image 21/series 2 remains stable. No other chest wall mass identified. No suspicious bone lesions identified. CT ABDOMEN PELVIS FINDINGS Hepatobiliary: Probable tiny sub-cm hepatic cyst remains stable. No masses or other significant abnormality. Pancreas: No mass, inflammatory changes, or other significant abnormality. Spleen: No evidence of splenomegaly. Several small low-attenuation splenic lesions measuring approximately 1 cm remains  stable. Adrenals/Urinary Tract: No masses identified. No evidence of hydronephrosis. Stomach/Bowel: No evidence of obstruction, inflammatory process, or abnormal fluid collections. Vascular/Lymphatic: No pathologically enlarged lymph nodes. No evidence of abdominal aortic aneursym. Reproductive: Prior hysterectomy noted. Adnexal regions are unremarkable in appearance. Other: There has been near complete resolution of ascites and diffuse body wall edema since previous study. Musculoskeletal:  No suspicious bone lesions identified. IMPRESSION: Stable 1.3 cm irregular soft tissue nodule in the lateral right breast. Stable tiny indeterminate low-attenuation splenic lesions measuring up to 1 cm. No new or progressive disease identified within the chest, abdomen, or pelvis. Electronically Signed   By: JoEarle Gell.D.   On: 10/20/2015 12:15    ASSESSMENT: Stage IV lobular breast cancer with ovarian and abdominal metastasis. ER/PR positive, HER-2/neu not overexpressing. Original primary was in left breast.  PLAN:    1. Breast cancer: CT scan results from October 20, 2015 reviewed independently and reported as above with no obvious evidence of recurrence. Patient's CA-27-29 and CA 125 continue to be within normal limits. Continue letrozole which patient will require lifelong or  until progression of disease. Return to clinic in 3 months with repeat laboratory work and further evaluation. Will reimage in 6 months or sooner if there is suspicion of progression of disease. 2. Left upper extremity swelling: Previously, ultrasound as well as CT scan were negative for DVT or other acute pathology. Likely secondary to lymphedema from prior surgeries. Continue treatment per lymphedema clinic. 3. Pain: Continue oxycodone as prescribed.  4. Leukocytosis:  Resolved. 5. Anemia: Patient's hemoglobin is now within normal limits.  Patient expressed understanding and was in agreement with this plan. She also understands that  She can call clinic at any time with any questions, concerns, or complaints.   Lloyd Huger, MD   10/24/2015 12:31 PM

## 2015-10-24 NOTE — Progress Notes (Signed)
Patient here to discuss test results and denies any problems today.

## 2015-11-28 ENCOUNTER — Inpatient Hospital Stay: Payer: Medicaid Other

## 2016-01-09 ENCOUNTER — Inpatient Hospital Stay: Payer: Medicaid Other

## 2016-01-26 ENCOUNTER — Inpatient Hospital Stay: Payer: Medicaid Other

## 2016-01-28 ENCOUNTER — Other Ambulatory Visit: Payer: Medicaid Other

## 2016-01-30 ENCOUNTER — Inpatient Hospital Stay: Payer: Medicaid Other | Attending: Oncology

## 2016-01-30 ENCOUNTER — Ambulatory Visit: Payer: Medicaid Other

## 2016-01-30 ENCOUNTER — Ambulatory Visit (HOSPITAL_BASED_OUTPATIENT_CLINIC_OR_DEPARTMENT_OTHER): Payer: Medicaid Other | Admitting: Oncology

## 2016-01-30 VITALS — BP 146/97 | HR 89 | Temp 97.9°F | Resp 18 | Wt 121.5 lb

## 2016-01-30 DIAGNOSIS — I10 Essential (primary) hypertension: Secondary | ICD-10-CM | POA: Diagnosis not present

## 2016-01-30 DIAGNOSIS — Z79899 Other long term (current) drug therapy: Secondary | ICD-10-CM | POA: Insufficient documentation

## 2016-01-30 DIAGNOSIS — Z853 Personal history of malignant neoplasm of breast: Secondary | ICD-10-CM | POA: Insufficient documentation

## 2016-01-30 DIAGNOSIS — I2510 Atherosclerotic heart disease of native coronary artery without angina pectoris: Secondary | ICD-10-CM | POA: Diagnosis not present

## 2016-01-30 DIAGNOSIS — Z923 Personal history of irradiation: Secondary | ICD-10-CM | POA: Insufficient documentation

## 2016-01-30 DIAGNOSIS — Z170 Estrogen receptor positive status [ER+]: Secondary | ICD-10-CM

## 2016-01-30 DIAGNOSIS — Z9221 Personal history of antineoplastic chemotherapy: Secondary | ICD-10-CM

## 2016-01-30 DIAGNOSIS — C50419 Malignant neoplasm of upper-outer quadrant of unspecified female breast: Secondary | ICD-10-CM

## 2016-01-30 DIAGNOSIS — C50411 Malignant neoplasm of upper-outer quadrant of right female breast: Secondary | ICD-10-CM | POA: Diagnosis present

## 2016-01-30 DIAGNOSIS — Z79811 Long term (current) use of aromatase inhibitors: Secondary | ICD-10-CM | POA: Insufficient documentation

## 2016-01-30 DIAGNOSIS — Z9011 Acquired absence of right breast and nipple: Secondary | ICD-10-CM | POA: Diagnosis not present

## 2016-01-30 DIAGNOSIS — Z87891 Personal history of nicotine dependence: Secondary | ICD-10-CM | POA: Insufficient documentation

## 2016-01-30 DIAGNOSIS — R109 Unspecified abdominal pain: Secondary | ICD-10-CM

## 2016-01-30 DIAGNOSIS — I252 Old myocardial infarction: Secondary | ICD-10-CM | POA: Insufficient documentation

## 2016-01-30 DIAGNOSIS — C50919 Malignant neoplasm of unspecified site of unspecified female breast: Secondary | ICD-10-CM

## 2016-01-30 LAB — CBC WITH DIFFERENTIAL/PLATELET
BASOS ABS: 0.1 10*3/uL (ref 0–0.1)
Basophils Relative: 1 %
Eosinophils Absolute: 0.4 10*3/uL (ref 0–0.7)
Eosinophils Relative: 5 %
HEMATOCRIT: 39.9 % (ref 35.0–47.0)
Hemoglobin: 13.7 g/dL (ref 12.0–16.0)
LYMPHS PCT: 18 %
Lymphs Abs: 1.4 10*3/uL (ref 1.0–3.6)
MCH: 32.1 pg (ref 26.0–34.0)
MCHC: 34.2 g/dL (ref 32.0–36.0)
MCV: 93.8 fL (ref 80.0–100.0)
Monocytes Absolute: 0.5 10*3/uL (ref 0.2–0.9)
Monocytes Relative: 7 %
NEUTROS ABS: 5.6 10*3/uL (ref 1.4–6.5)
Neutrophils Relative %: 69 %
Platelets: 277 10*3/uL (ref 150–440)
RBC: 4.26 MIL/uL (ref 3.80–5.20)
RDW: 14.1 % (ref 11.5–14.5)
WBC: 8 10*3/uL (ref 3.6–11.0)

## 2016-01-30 LAB — COMPREHENSIVE METABOLIC PANEL
ALT: 17 U/L (ref 14–54)
AST: 21 U/L (ref 15–41)
Albumin: 3.7 g/dL (ref 3.5–5.0)
Alkaline Phosphatase: 65 U/L (ref 38–126)
Anion gap: 8 (ref 5–15)
BILIRUBIN TOTAL: 0.3 mg/dL (ref 0.3–1.2)
BUN: 8 mg/dL (ref 6–20)
CO2: 25 mmol/L (ref 22–32)
CREATININE: 0.55 mg/dL (ref 0.44–1.00)
Calcium: 8.4 mg/dL — ABNORMAL LOW (ref 8.9–10.3)
Chloride: 103 mmol/L (ref 101–111)
GFR calc Af Amer: >60 mL/min (ref >60)
Glucose, Bld: 114 mg/dL — ABNORMAL HIGH (ref 65–99)
Potassium: 3.7 mmol/L (ref 3.5–5.1)
Sodium: 136 mmol/L (ref 135–145)
TOTAL PROTEIN: 6.5 g/dL (ref 6.5–8.1)

## 2016-01-30 MED ORDER — HEPARIN SOD (PORK) LOCK FLUSH 100 UNIT/ML IV SOLN
500.0000 [IU] | Freq: Once | INTRAVENOUS | Status: AC
  Administered 2016-01-30: 500 [IU] via INTRAVENOUS

## 2016-01-30 MED ORDER — SODIUM CHLORIDE 0.9% FLUSH
10.0000 mL | INTRAVENOUS | Status: DC | PRN
  Administered 2016-01-30: 10 mL via INTRAVENOUS
  Filled 2016-01-30: qty 10

## 2016-01-30 MED ORDER — HEPARIN SOD (PORK) LOCK FLUSH 100 UNIT/ML IV SOLN
INTRAVENOUS | Status: AC
  Filled 2016-01-30: qty 5

## 2016-01-30 NOTE — Progress Notes (Signed)
[place]  Telephone:(336) [phone] Fax:(336) [phone]  ID: [name] OB: Sep 08, 1971  MR#: [id number]  [id number]#:[id number]  Patient Care Team: [facility] as PCP - General  CHIEF COMPLAINT:  Chief Complaint  Patient presents with  . Breast Cancer    INTERVAL HISTORY: Patient returns to clinic today for further evaluation and lab work. She has complaints of abdominal pain that started about a month ago. Otherwise she is doing well with no other complaints.  She has no neurologic complaints. She denies any fevers. She denies chest pain or shortness of breath. She denies any nausea, vomiting, constipation, or diarrhea. She has no urinary complaints. Patient offers no further specific complaints.  REVIEW OF SYSTEMS:   Review of Systems  Constitutional: Negative.  Negative for fever and weight loss.  Respiratory: Negative.   Cardiovascular: Negative.  Negative for leg swelling.  Gastrointestinal: Positive for abdominal pain.  Genitourinary: Negative.   Musculoskeletal: Negative.     As per HPI. Otherwise, a complete review of systems is negatve.  PAST MEDICAL HISTORY: Past Medical History  Diagnosis Date  . Asthma 2005  . Hypertension 2012  . Personal history of tobacco use, presenting hazards to health   . Myocardial infarction ([place]) 10/2012  . Lump or mass in breast 2012    right  . CAD (coronary artery disease)   . MI (myocardial infarction)   . Cancer 2012    breast cancer, right-treated with neoadjunvant chemotherapy followed by partial mastectomy with sn bx and radiation. Pt is currently taking Tamoxifen since January 2013   . Malignant neoplasm of upper-outer quadrant of female breast 2012    right  . Breast cancer     PAST SURGICAL HISTORY: Past Surgical History  Procedure Laterality Date  . Portacath placement    . Cesarean section  [id number]  . Tubal ligation  2004  . Breast lumpectomy Right 2012  . Finger  surgery Left 1986    reconstruction, left pinky  . Wrist surgery Left 1987  . Coronary angioplasty with stent placement  20123    Myocardial infarction  . Left heart catheterization with coronary angiogram N/A 10/16/2012    Procedure: LEFT HEART CATHETERIZATION WITH CORONARY ANGIOGRAM;  Surgeon: Clent Demark, MD;  Location: [facility] CATH LAB;  Service: Cardiovascular;  Laterality: N/A;    FAMILY HISTORY Family History  Problem Relation Age of Onset  . Cancer Maternal Grandmother 69    breast       ADVANCED DIRECTIVES:    HEALTH MAINTENANCE: Social History  Substance Use Topics  . Smoking status: Current Every Day Smoker -- 1.00 packs/day for 15 years    Types: Cigarettes  . Smokeless tobacco: Not on file  . Alcohol Use: No     Allergies  Allergen Reactions  . Codeine Nausea And Vomiting  . Penicillins Rash    Current Outpatient Prescriptions  Medication Sig Dispense Refill  . albuterol (PROVENTIL HFA;VENTOLIN HFA) 108 (90 BASE) MCG/ACT inhaler Inhale 2 puffs into the lungs every 6 (six) hours as needed for wheezing or shortness of breath. 1 Inhaler 0  . aspirin EC 81 MG EC tablet Take 1 tablet (81 mg total) by mouth daily. 30 tablet 3  . bismuth subsalicylate (PEPTO BISMOL) 262 MG/15ML suspension Take 30 mLs by mouth every 6 (six) hours as needed for indigestion.    . cyclobenzaprine (FLEXERIL) 10 MG tablet Take 1 tablet (10 mg total) by mouth 3 (three) times daily as needed  for muscle spasms. 30 tablet 0  . docusate sodium (COLACE) 100 MG capsule Take 100 mg by mouth daily as needed.     Marland Kitchen letrozole (FEMARA) 2.5 MG tablet Take 1 tablet (2.5 mg total) by mouth daily. 30 tablet 5  . metoprolol tartrate (LOPRESSOR) 25 MG tablet Take 0.5 tablets (12.5 mg total) by mouth 2 (two) times daily. 30 tablet 0  . nitroGLYCERIN (NITROSTAT) 0.4 MG SL tablet Place 1 tablet (0.4 mg total) under the tongue every 5 (five) minutes x 3 doses as needed for chest pain. 30 tablet 0  . omeprazole  (PRILOSEC) 20 MG capsule Take 1 capsule (20 mg total) by mouth daily. 30 capsule 3   No current facility-administered medications for this visit.   Facility-Administered Medications Ordered in Other Visits  Medication Dose Route Frequency Provider Last Rate Last Dose  . sodium chloride 0.9 % injection 10 mL  10 mL Intracatheter PRN Lloyd Huger, MD   10 mL at 04/25/15 1204  . sodium chloride flush (NS) 0.9 % injection 10 mL  10 mL Intravenous PRN Lloyd Huger, MD   10 mL at 01/30/16 1020    OBJECTIVE: Filed Vitals:   01/30/16 1112  BP: 146/97  Pulse: 89  Temp: 97.9 F (36.6 C)  Resp: 18     Body mass index is 22.35 kg/(m^2).    ECOG FS:1 - Symptomatic but completely ambulatory  General: Well-developed, well-nourished, no acute distress. Eyes: anicteric sclera. Breasts: Exam deferred today. Lungs: Clear to auscultation bilaterally. Heart: Regular rate and rhythm. No rubs, murmurs, or gallops. Abdomen: Soft, tender to palpation, nondistended.  Musculoskeletal: Minimal lower extremity edema. Lymphedema wrap in place on left arm. Neuro: Alert, answering all questions appropriately. Cranial nerves grossly intact. Skin: No rashes or petechiae noted. Psych: Normal affect.   LAB RESULTS:  Lab Results  Component Value Date   NA 136 01/30/2016   K 3.7 01/30/2016   CL 103 01/30/2016   CO2 25 01/30/2016   GLUCOSE 114* 01/30/2016   BUN 8 01/30/2016   CREATININE 0.55 01/30/2016   CALCIUM 8.4* 01/30/2016   PROT 6.5 01/30/2016   ALBUMIN 3.7 01/30/2016   AST 21 01/30/2016   ALT 17 01/30/2016   ALKPHOS 65 01/30/2016   BILITOT 0.3 01/30/2016   GFRNONAA >60 01/30/2016   GFRAA >60 01/30/2016    Lab Results  Component Value Date   WBC 8.0 01/30/2016   NEUTROABS 5.6 01/30/2016   HGB 13.7 01/30/2016   HCT 39.9 01/30/2016   MCV 93.8 01/30/2016   PLT 277 01/30/2016   Lab Results  Component Value Date   LABCA2 40.9* 01/30/2016    STUDIES: No results  found.  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 ER/PR positive, HER-2/neu not overexpressing. Original primary was in left breast.  PLAN:    1. Breast cancer: Patient's CA-27-29 and CA 125 continue to be within normal limits. CA 27-29 results Are only mildly elevated. Continue letrozole which patient will require lifelong or until progression of disease.  Return to clinic in 3 months with repeat laboratory work and further evaluation. If CT scan or CA 27.29 suggests recurrence will call the patient to move appointment up. 2. Abdominal pain: CT scan in the next 1-2 weeks. Call patient if appointment needs made to go over results.  3. Hypocalcemia: 8.4 today. Patient encouraged to start calcium and vitamin D twice a day.   Patient expressed understanding and was in agreement with this plan.  She also understands that She can call clinic at any time with any questions, concerns, or complaints.   Mayra Reel, NP   01/30/2016 11:36 AM  Patient was seen and evaluated independently and I agree with the assessment and plan as dictated above.  Lloyd Huger, MD 02/06/2016 3:47 PM

## 2016-01-30 NOTE — Progress Notes (Signed)
Patient has abdominal pain for the past 4-5 weeks.  The pain will get to 6/10 on pain scale with activity.

## 2016-01-31 LAB — CA 27.29 (SERIAL MONITOR): CA 27.29: 40.9 U/mL — AB (ref 0.0–38.6)

## 2016-02-03 ENCOUNTER — Ambulatory Visit
Admission: RE | Admit: 2016-02-03 | Discharge: 2016-02-03 | Disposition: A | Discharge status: Home / Self Care | Payer: Medicaid Other | Source: Ambulatory Visit | Attending: Oncology | Admitting: Oncology

## 2016-02-03 DIAGNOSIS — I700 Atherosclerosis of aorta: Secondary | ICD-10-CM | POA: Diagnosis not present

## 2016-02-03 DIAGNOSIS — C50419 Malignant neoplasm of upper-outer quadrant of unspecified female breast: Secondary | ICD-10-CM

## 2016-02-03 MED ORDER — IOHEXOL 300 MG/ML  SOLN
75.0000 mL | Freq: Once | INTRAMUSCULAR | Status: AC | PRN
  Administered 2016-02-03: 75 mL via INTRAVENOUS

## 2016-02-20 ENCOUNTER — Inpatient Hospital Stay: Payer: Medicaid Other

## 2016-02-24 ENCOUNTER — Inpatient Hospital Stay: Payer: Medicaid Other | Attending: Oncology

## 2016-03-10 ENCOUNTER — Other Ambulatory Visit: Payer: Self-pay | Admitting: Emergency Medicine

## 2016-03-10 DIAGNOSIS — M50021 Cervical disc disorder at C4-C5 level with myelopathy: Secondary | ICD-10-CM

## 2016-03-18 ENCOUNTER — Ambulatory Visit: Payer: Medicaid Other

## 2016-03-24 ENCOUNTER — Other Ambulatory Visit: Payer: Self-pay | Admitting: Emergency Medicine

## 2016-03-24 DIAGNOSIS — M546 Pain in thoracic spine: Secondary | ICD-10-CM

## 2016-03-24 DIAGNOSIS — C799 Secondary malignant neoplasm of unspecified site: Secondary | ICD-10-CM

## 2016-03-25 ENCOUNTER — Ambulatory Visit: Payer: Medicaid Other

## 2016-04-02 ENCOUNTER — Inpatient Hospital Stay: Payer: Medicaid Other | Attending: Oncology

## 2016-04-02 ENCOUNTER — Other Ambulatory Visit: Payer: Self-pay | Admitting: *Deleted

## 2016-04-02 VITALS — BP 158/90 | HR 80 | Temp 98.2°F

## 2016-04-02 DIAGNOSIS — Z95828 Presence of other vascular implants and grafts: Secondary | ICD-10-CM

## 2016-04-02 DIAGNOSIS — R0602 Shortness of breath: Secondary | ICD-10-CM

## 2016-04-02 DIAGNOSIS — Z452 Encounter for adjustment and management of vascular access device: Secondary | ICD-10-CM | POA: Diagnosis not present

## 2016-04-02 DIAGNOSIS — C50411 Malignant neoplasm of upper-outer quadrant of right female breast: Secondary | ICD-10-CM | POA: Diagnosis not present

## 2016-04-02 DIAGNOSIS — Z170 Estrogen receptor positive status [ER+]: Secondary | ICD-10-CM | POA: Insufficient documentation

## 2016-04-02 MED ORDER — SODIUM CHLORIDE 0.9% FLUSH
10.0000 mL | INTRAVENOUS | Status: DC | PRN
Start: 2016-04-02 — End: 2016-04-02
  Administered 2016-04-02: 10 mL via INTRAVENOUS
  Filled 2016-04-02: qty 10

## 2016-04-02 MED ORDER — HEPARIN SOD (PORK) LOCK FLUSH 100 UNIT/ML IV SOLN
500.0000 [IU] | Freq: Once | INTRAVENOUS | Status: AC
  Administered 2016-04-02: 500 [IU] via INTRAVENOUS

## 2016-04-14 ENCOUNTER — Ambulatory Visit
Admission: RE | Admit: 2016-04-14 | Discharge: 2016-04-14 | Disposition: A | Discharge status: Home / Self Care | Payer: Medicaid Other | Source: Ambulatory Visit | Attending: Emergency Medicine | Admitting: Emergency Medicine

## 2016-04-14 DIAGNOSIS — M4712 Other spondylosis with myelopathy, cervical region: Secondary | ICD-10-CM | POA: Diagnosis not present

## 2016-04-14 DIAGNOSIS — M50021 Cervical disc disorder at C4-C5 level with myelopathy: Secondary | ICD-10-CM | POA: Insufficient documentation

## 2016-04-16 ENCOUNTER — Telehealth: Payer: Self-pay | Admitting: Oncology

## 2016-04-16 ENCOUNTER — Other Ambulatory Visit: Payer: Self-pay | Admitting: *Deleted

## 2016-04-16 ENCOUNTER — Telehealth: Payer: Self-pay | Admitting: *Deleted

## 2016-04-16 DIAGNOSIS — C801 Malignant (primary) neoplasm, unspecified: Secondary | ICD-10-CM

## 2016-04-16 MED ORDER — PROCHLORPERAZINE MALEATE 10 MG PO TABS: 10.0000 mg | ORAL_TABLET | Freq: Four times a day (QID) | ORAL | Status: AC | PRN

## 2016-04-16 NOTE — Telephone Encounter (Signed)
Patient has been vommitting all night and having chills and having a lot of shoulder pain. She had an MRI or some kind of scan on her shoulder. She is concerned her symptoms may be related to her cancer and wants to talk to you about it. Please call: [phone]

## 2016-04-16 NOTE — Telephone Encounter (Signed)
Returned call to patient, patient states she has been vomiting for about 48 hours. She is unable to tolerate anything to eat or drink. Patient reports weakness, shoulder pain. I advised patient that given the fact that its Friday afternoon she should be evaluated in the ER if symptoms persist and she is unable to tolerated food/drink. Patient verbalized understanding and agrees to to the ER if symptoms are not better in the morning. I also told patient I would speak with Dr. Grayland Ormond regarding refilling her compazine.

## 2016-04-29 ENCOUNTER — Other Ambulatory Visit: Payer: Self-pay | Admitting: *Deleted

## 2016-04-29 DIAGNOSIS — C50419 Malignant neoplasm of upper-outer quadrant of unspecified female breast: Secondary | ICD-10-CM

## 2016-04-30 ENCOUNTER — Inpatient Hospital Stay (HOSPITAL_BASED_OUTPATIENT_CLINIC_OR_DEPARTMENT_OTHER): Payer: Medicaid Other | Admitting: Oncology

## 2016-04-30 ENCOUNTER — Inpatient Hospital Stay: Payer: Medicaid Other | Attending: Oncology

## 2016-04-30 ENCOUNTER — Telehealth: Payer: Self-pay | Admitting: Oncology

## 2016-04-30 VITALS — BP 168/110 | HR 96 | Temp 98.6°F | Resp 18 | Wt 124.1 lb

## 2016-04-30 DIAGNOSIS — Z7982 Long term (current) use of aspirin: Secondary | ICD-10-CM | POA: Insufficient documentation

## 2016-04-30 DIAGNOSIS — J45909 Unspecified asthma, uncomplicated: Secondary | ICD-10-CM | POA: Diagnosis not present

## 2016-04-30 DIAGNOSIS — Z170 Estrogen receptor positive status [ER+]: Secondary | ICD-10-CM | POA: Diagnosis not present

## 2016-04-30 DIAGNOSIS — C50411 Malignant neoplasm of upper-outer quadrant of right female breast: Secondary | ICD-10-CM | POA: Diagnosis not present

## 2016-04-30 DIAGNOSIS — M25511 Pain in right shoulder: Secondary | ICD-10-CM | POA: Insufficient documentation

## 2016-04-30 DIAGNOSIS — Z79810 Long term (current) use of selective estrogen receptor modulators (SERMs): Secondary | ICD-10-CM

## 2016-04-30 DIAGNOSIS — Z9221 Personal history of antineoplastic chemotherapy: Secondary | ICD-10-CM

## 2016-04-30 DIAGNOSIS — I2510 Atherosclerotic heart disease of native coronary artery without angina pectoris: Secondary | ICD-10-CM | POA: Diagnosis not present

## 2016-04-30 DIAGNOSIS — Z79899 Other long term (current) drug therapy: Secondary | ICD-10-CM | POA: Diagnosis not present

## 2016-04-30 DIAGNOSIS — I10 Essential (primary) hypertension: Secondary | ICD-10-CM | POA: Insufficient documentation

## 2016-04-30 DIAGNOSIS — C50419 Malignant neoplasm of upper-outer quadrant of unspecified female breast: Secondary | ICD-10-CM

## 2016-04-30 DIAGNOSIS — I252 Old myocardial infarction: Secondary | ICD-10-CM | POA: Diagnosis not present

## 2016-04-30 DIAGNOSIS — Z923 Personal history of irradiation: Secondary | ICD-10-CM

## 2016-04-30 DIAGNOSIS — Z9011 Acquired absence of right breast and nipple: Secondary | ICD-10-CM

## 2016-04-30 DIAGNOSIS — Z955 Presence of coronary angioplasty implant and graft: Secondary | ICD-10-CM | POA: Diagnosis not present

## 2016-04-30 DIAGNOSIS — F17210 Nicotine dependence, cigarettes, uncomplicated: Secondary | ICD-10-CM | POA: Insufficient documentation

## 2016-04-30 LAB — CBC WITH DIFFERENTIAL/PLATELET
BASOS ABS: 0.1 10*3/uL (ref 0–0.1)
BASOS PCT: 1 %
Eosinophils Absolute: 0.1 10*3/uL (ref 0–0.7)
Eosinophils Relative: 1 %
HEMATOCRIT: 44.5 % (ref 35.0–47.0)
Hemoglobin: 15.3 g/dL (ref 12.0–16.0)
LYMPHS PCT: 17 %
Lymphs Abs: 1.6 10*3/uL (ref 1.0–3.6)
MCH: 31.8 pg (ref 26.0–34.0)
MCHC: 34.3 g/dL (ref 32.0–36.0)
MCV: 92.5 fL (ref 80.0–100.0)
MONOS PCT: 6 %
Monocytes Absolute: 0.6 10*3/uL (ref 0.2–0.9)
NEUTROS ABS: 6.9 10*3/uL — AB (ref 1.4–6.5)
Neutrophils Relative %: 75 %
Platelets: 308 10*3/uL (ref 150–440)
RBC: 4.81 MIL/uL (ref 3.80–5.20)
RDW: 14.7 % — AB (ref 11.5–14.5)
WBC: 9.3 10*3/uL (ref 3.6–11.0)

## 2016-04-30 LAB — COMPREHENSIVE METABOLIC PANEL
ALT: 14 U/L (ref 14–54)
AST: 15 U/L (ref 15–41)
Albumin: 4.4 g/dL (ref 3.5–5.0)
Alkaline Phosphatase: 68 U/L (ref 38–126)
Anion gap: 8 (ref 5–15)
BILIRUBIN TOTAL: 0.7 mg/dL (ref 0.3–1.2)
BUN: 7 mg/dL (ref 6–20)
CO2: 30 mmol/L (ref 22–32)
CREATININE: 0.77 mg/dL (ref 0.44–1.00)
Calcium: 8.9 mg/dL (ref 8.9–10.3)
Chloride: 100 mmol/L — ABNORMAL LOW (ref 101–111)
GFR calc Af Amer: >60 mL/min (ref >60)
Glucose, Bld: 89 mg/dL (ref 65–99)
POTASSIUM: 3.7 mmol/L (ref 3.5–5.1)
Sodium: 138 mmol/L (ref 135–145)
TOTAL PROTEIN: 7.7 g/dL (ref 6.5–8.1)

## 2016-04-30 NOTE — Progress Notes (Signed)
[place]  Telephone:(336) [phone] Fax:(336) [phone]  ID: [name] OB: [birth year]  MR#: [id number]  VQQ#:[id number]  Patient Care Team: [facility] as PCP - General  CHIEF COMPLAINT:  Chief Complaint  Patient presents with  . Breast Cancer    INTERVAL HISTORY: Patient returns to clinic today for further evaluation and lab work. She no longer complains of abdominal pain but now she has right shoulder pain. Her pcp told her she could have bone cancer. She also has some lumps on the palms of her hands that she doesn't know what they are. They are tender to touch.  Otherwise she is doing well with no other complaints.  She has no neurologic complaints. She denies any fevers. She denies chest pain or shortness of breath. She denies any nausea, vomiting, constipation, or diarrhea. She has no urinary complaints. Patient offers no further specific complaints.  REVIEW OF SYSTEMS:   Review of Systems  Constitutional: Negative.  Negative for fever and weight loss.  Eyes: Negative.   Respiratory: Negative.   Cardiovascular: Negative.  Negative for leg swelling.  Gastrointestinal: Negative.  Negative for abdominal pain.  Genitourinary: Negative.   Musculoskeletal: Positive for joint pain.    As per HPI. Otherwise, a complete review of systems is negatve.  PAST MEDICAL HISTORY: Past Medical History  Diagnosis Date  . Asthma 2005  . Hypertension 2012  . Personal history of tobacco use, presenting hazards to health   . Myocardial infarction ([place]) 10/2012  . Lump or mass in breast 2012    right  . CAD (coronary artery disease)   . MI (myocardial infarction) ([place])   . Cancer [facility]) 2012    breast cancer, right-treated with neoadjunvant chemotherapy followed by partial mastectomy with sn bx and radiation. Pt is currently taking Tamoxifen since January 2013   . Malignant neoplasm of upper-outer quadrant of female breast ([place]) 2012    right    . Breast cancer ([place])     PAST SURGICAL HISTORY: Past Surgical History  Procedure Laterality Date  . Portacath placement    . Cesarean section  [id number]  . Tubal ligation  2004  . Breast lumpectomy Right 2012  . Finger surgery Left 1986    reconstruction, left pinky  . Wrist surgery Left 1987  . Coronary angioplasty with stent placement  20123    Myocardial infarction  . Left heart catheterization with coronary angiogram N/A 10/16/2012    Procedure: LEFT HEART CATHETERIZATION WITH CORONARY ANGIOGRAM;  Surgeon: Clent Demark, MD;  Location: [facility] CATH LAB;  Service: Cardiovascular;  Laterality: N/A;    FAMILY HISTORY Family History  Problem Relation Age of Onset  . Cancer Maternal Grandmother 69    breast       ADVANCED DIRECTIVES:    HEALTH MAINTENANCE: Social History  Substance Use Topics  . Smoking status: Current Every Day Smoker -- 1.00 packs/day for 15 years    Types: Cigarettes  . Smokeless tobacco: Not on file  . Alcohol Use: No     Allergies  Allergen Reactions  . Codeine Nausea And Vomiting  . Penicillins Rash    Current Outpatient Prescriptions  Medication Sig Dispense Refill  . albuterol (PROVENTIL HFA;VENTOLIN HFA) 108 (90 BASE) MCG/ACT inhaler Inhale 2 puffs into the lungs every 6 (six) hours as needed for wheezing or shortness of breath. 1 Inhaler 0  . aspirin EC 81 MG EC tablet Take 1 tablet (81 mg total) by mouth daily.  30 tablet 3  . letrozole (FEMARA) 2.5 MG tablet Take 1 tablet (2.5 mg total) by mouth daily. 30 tablet 5  . nitroGLYCERIN (NITROSTAT) 0.4 MG SL tablet Place 1 tablet (0.4 mg total) under the tongue every 5 (five) minutes x 3 doses as needed for chest pain. 30 tablet 0  . omeprazole (PRILOSEC) 20 MG capsule Take 1 capsule (20 mg total) by mouth daily. 30 capsule 3  . oxyCODONE-acetaminophen (PERCOCET/ROXICET) 5-325 MG tablet Take 1 tablet by mouth at bedtime as needed for severe pain.    Marland Kitchen prochlorperazine (COMPAZINE) 10 MG tablet  Take 1 tablet (10 mg total) by mouth every 6 (six) hours as needed for nausea or vomiting. 30 tablet 0   No current facility-administered medications for this visit.   Facility-Administered Medications Ordered in Other Visits  Medication Dose Route Frequency Provider Last Rate Last Dose  . sodium chloride 0.9 % injection 10 mL  10 mL Intracatheter PRN Lloyd Huger, MD   10 mL at 04/25/15 1204    OBJECTIVE: Filed Vitals:   04/30/16 1024  BP: 168/110  Pulse: 96  Temp: 98.6 F (37 C)  Resp: 18     Body mass index is 22.84 kg/(m^2).    ECOG FS:1 - Symptomatic but completely ambulatory  General: Well-developed, well-nourished, no acute distress. Eyes: anicteric sclera. Breasts: Exam deferred today. Lungs: Clear to auscultation bilaterally. Heart: Regular rate and rhythm. No rubs, murmurs, or gallops. Abdomen: Soft, tender to palpation, nondistended.  Musculoskeletal: Right should pain Neuro: Alert, answering all questions appropriately.  Skin: No rashes or petechiae noted. Psych: Normal affect.   LAB RESULTS:  Lab Results  Component Value Date   NA 138 04/30/2016   K 3.7 04/30/2016   CL 100* 04/30/2016   CO2 30 04/30/2016   GLUCOSE 89 04/30/2016   BUN 7 04/30/2016   CREATININE 0.77 04/30/2016   CALCIUM 8.9 04/30/2016   PROT 7.7 04/30/2016   ALBUMIN 4.4 04/30/2016   AST 15 04/30/2016   ALT 14 04/30/2016   ALKPHOS 68 04/30/2016   BILITOT 0.7 04/30/2016   GFRNONAA >60 04/30/2016   GFRAA >60 04/30/2016    Lab Results  Component Value Date   WBC 9.3 04/30/2016   NEUTROABS 6.9* 04/30/2016   HGB 15.3 04/30/2016   HCT 44.5 04/30/2016   MCV 92.5 04/30/2016   PLT 308 04/30/2016   Lab Results  Component Value Date   LABCA2 40.9* 01/30/2016    STUDIES: Ct Cervical Spine Wo Contrast  04/14/2016  CLINICAL DATA:  Neck, right shoulder and right arm pain with cervical disc disease and myelopathy. EXAM: CT CERVICAL SPINE WITHOUT CONTRAST TECHNIQUE: Multidetector CT  imaging of the cervical spine was performed without intravenous contrast. Multiplanar CT image reconstructions were also generated. COMPARISON:  None. FINDINGS: The cervical spine shows normal alignment. There is no evidence of acute fracture or subluxation. No soft tissue swelling or hematoma is identified. There is mild spondylosis at the C5-6 level. No bony or soft tissue lesions are seen. The visualized airway is normally patent. IMPRESSION: Mild cervical spondylosis at C5-6. Electronically Signed   By: Aletta Edouard M.D.   On: 04/14/2016 15:37    ASSESSMENT: Stage IV lobular breast cancer with ovarian and abdominal metastasis. ER/PR positive, HER-2/neu not overexpressing. Original primary was in left breast.  PLAN:    1. Breast cancer: Patient's CA-27-29 are only mildly elevated.  Last CA 125 was WNL in 2016.  Continue letrozole which patient will require lifelong or until  progression of disease.  Return to clinic in 3 months with repeat laboratory work and further evaluation. When CA 27.29 comes back next week Dr Grayland Ormond will make a decision about whether a scan needs repeated and will call the patient 2. Abdominal pain: CT scan normal.  3. Hypocalcemia: WNL today. Continue calcium and vitamin D twice a day.  4. Lumps on hands: Defer to pcp 5. Right shoulder pain: Dr Grayland Ormond to decide on scan next week otherwise treatment per pcp.  Patient expressed understanding and was in agreement with this plan. She also understands that She can call clinic at any time with any questions, concerns, or complaints.   Dr. Grayland Ormond was available for consultation and review of plan of care for this patient  Mayra Reel, NP   04/30/2016 12:01 PM

## 2016-04-30 NOTE — Telephone Encounter (Signed)
Patient would like Dr. Grayland Ormond to call her next week when he returns from vacation regarding health concerns brought up by another doctor.  Please call [phone]

## 2016-04-30 NOTE — Progress Notes (Signed)
States for the past 3 weeks right shoulder pain has intensified. Has seen Dr. Moody Bruins at [facility] and CT scan was performed. Other scans were put on hold until after seen by MD at cancer center in case PET was ordered. Pt needs refill of proair as well.

## 2016-05-01 LAB — CANCER ANTIGEN 27.29: CA 27.29: 59.9 U/mL — AB (ref 0.0–38.6)

## 2016-05-03 ENCOUNTER — Other Ambulatory Visit: Payer: Self-pay | Admitting: *Deleted

## 2016-05-03 ENCOUNTER — Telehealth: Payer: Self-pay | Admitting: *Deleted

## 2016-05-03 DIAGNOSIS — C50419 Malignant neoplasm of upper-outer quadrant of unspecified female breast: Secondary | ICD-10-CM

## 2016-05-03 NOTE — Telephone Encounter (Signed)
Called to follow up with patient regarding concerns. Patient states she was recently seen by Dr. Moody Bruins at [facility] and had scans performed due to increasing pain in right arm and shoulder. Patient states she was told be Dr. Moody Bruins she had potential bone mets in this area. CA 27-29 drawn on 6/23 is 59.9. Patient concerned and questioning if she needs PET scan. I will call [place] to see if they can fax records of most recent visits. There is CT Spine in [facility] from 6/7.

## 2016-05-03 NOTE — Telephone Encounter (Signed)
Per Dr. Grayland Ormond patient needs to proceed with PET scan. Patient notified. Schedule message sent to get PET scan and follow up arranged.

## 2016-05-12 ENCOUNTER — Other Ambulatory Visit: Payer: Self-pay | Admitting: Oncology

## 2016-05-12 ENCOUNTER — Ambulatory Visit: Admission: RE | Admit: 2016-05-12 | Payer: Medicaid Other | Source: Ambulatory Visit

## 2016-05-12 DIAGNOSIS — C50419 Malignant neoplasm of upper-outer quadrant of unspecified female breast: Secondary | ICD-10-CM

## 2016-05-13 ENCOUNTER — Ambulatory Visit: Admission: RE | Admit: 2016-05-13 | Payer: Medicaid Other | Source: Ambulatory Visit

## 2016-05-14 ENCOUNTER — Inpatient Hospital Stay: Payer: Medicaid Other

## 2016-05-14 ENCOUNTER — Ambulatory Visit: Payer: Medicaid Other | Admitting: Oncology

## 2016-05-17 ENCOUNTER — Telehealth: Payer: Self-pay | Admitting: Oncology

## 2016-05-17 NOTE — Telephone Encounter (Signed)
Will talk to her on Friday about her inhaler. Thanks!

## 2016-05-17 NOTE — Telephone Encounter (Signed)
Patient came into [facility] to give me this message for [employer] [employer]ja. She wants to know if [facility]ja can call her in a mist inhaler. She hasn't used one in four years but the intense heat this summer has caused her to have two asthma attacks so far. Can't see cardiologist (who usually prescribes for her) until end of month but can't get in until end of month bc she is seeing a new one Finn sent her to. [phone].

## 2016-05-18 ENCOUNTER — Encounter (HOSPITAL_COMMUNITY): Payer: Self-pay

## 2016-05-18 ENCOUNTER — Ambulatory Visit (HOSPITAL_COMMUNITY)
Admission: RE | Admit: 2016-05-18 | Discharge: 2016-05-18 | Disposition: A | Discharge status: Home / Self Care | Payer: Medicaid Other | Source: Ambulatory Visit | Attending: Oncology | Admitting: Oncology

## 2016-05-18 ENCOUNTER — Encounter (HOSPITAL_COMMUNITY)
Admission: RE | Admit: 2016-05-18 | Discharge: 2016-05-18 | Disposition: A | Discharge status: Home / Self Care | Payer: Medicaid Other | Source: Ambulatory Visit | Attending: Oncology | Admitting: Oncology

## 2016-05-18 DIAGNOSIS — I700 Atherosclerosis of aorta: Secondary | ICD-10-CM | POA: Insufficient documentation

## 2016-05-18 DIAGNOSIS — R935 Abnormal findings on diagnostic imaging of other abdominal regions, including retroperitoneum: Secondary | ICD-10-CM | POA: Insufficient documentation

## 2016-05-18 DIAGNOSIS — C50419 Malignant neoplasm of upper-outer quadrant of unspecified female breast: Secondary | ICD-10-CM

## 2016-05-18 DIAGNOSIS — N6489 Other specified disorders of breast: Secondary | ICD-10-CM | POA: Diagnosis not present

## 2016-05-18 DIAGNOSIS — R918 Other nonspecific abnormal finding of lung field: Secondary | ICD-10-CM | POA: Diagnosis not present

## 2016-05-18 MED ORDER — TECHNETIUM TC 99M MEDRONATE IV KIT
26.3000 | PACK | Freq: Once | INTRAVENOUS | Status: AC | PRN
  Administered 2016-05-18: 26.3 via INTRAVENOUS

## 2016-05-18 MED ORDER — IOPAMIDOL (ISOVUE-300) INJECTION 61%
100.0000 mL | Freq: Once | INTRAVENOUS | Status: AC | PRN
  Administered 2016-05-18: 100 mL via INTRAVENOUS

## 2016-05-21 ENCOUNTER — Inpatient Hospital Stay: Payer: Medicaid Other

## 2016-05-21 ENCOUNTER — Encounter: Payer: Self-pay | Admitting: Oncology

## 2016-05-21 ENCOUNTER — Inpatient Hospital Stay: Payer: Medicaid Other | Attending: Oncology | Admitting: Oncology

## 2016-05-21 VITALS — BP 118/89 | HR 99 | Temp 98.4°F | Wt 121.9 lb

## 2016-05-21 DIAGNOSIS — Z955 Presence of coronary angioplasty implant and graft: Secondary | ICD-10-CM | POA: Insufficient documentation

## 2016-05-21 DIAGNOSIS — I252 Old myocardial infarction: Secondary | ICD-10-CM | POA: Insufficient documentation

## 2016-05-21 DIAGNOSIS — C50412 Malignant neoplasm of upper-outer quadrant of left female breast: Secondary | ICD-10-CM

## 2016-05-21 DIAGNOSIS — Z95828 Presence of other vascular implants and grafts: Secondary | ICD-10-CM

## 2016-05-21 DIAGNOSIS — M25511 Pain in right shoulder: Secondary | ICD-10-CM | POA: Insufficient documentation

## 2016-05-21 DIAGNOSIS — Z79811 Long term (current) use of aromatase inhibitors: Secondary | ICD-10-CM | POA: Insufficient documentation

## 2016-05-21 DIAGNOSIS — Z170 Estrogen receptor positive status [ER+]: Secondary | ICD-10-CM | POA: Diagnosis not present

## 2016-05-21 DIAGNOSIS — Z7982 Long term (current) use of aspirin: Secondary | ICD-10-CM | POA: Diagnosis not present

## 2016-05-21 DIAGNOSIS — J45909 Unspecified asthma, uncomplicated: Secondary | ICD-10-CM | POA: Insufficient documentation

## 2016-05-21 DIAGNOSIS — Z923 Personal history of irradiation: Secondary | ICD-10-CM | POA: Diagnosis not present

## 2016-05-21 DIAGNOSIS — Z79899 Other long term (current) drug therapy: Secondary | ICD-10-CM | POA: Diagnosis not present

## 2016-05-21 DIAGNOSIS — C50912 Malignant neoplasm of unspecified site of left female breast: Secondary | ICD-10-CM

## 2016-05-21 DIAGNOSIS — C786 Secondary malignant neoplasm of retroperitoneum and peritoneum: Secondary | ICD-10-CM | POA: Insufficient documentation

## 2016-05-21 DIAGNOSIS — I2510 Atherosclerotic heart disease of native coronary artery without angina pectoris: Secondary | ICD-10-CM | POA: Insufficient documentation

## 2016-05-21 DIAGNOSIS — F17210 Nicotine dependence, cigarettes, uncomplicated: Secondary | ICD-10-CM | POA: Diagnosis not present

## 2016-05-21 DIAGNOSIS — I10 Essential (primary) hypertension: Secondary | ICD-10-CM | POA: Diagnosis not present

## 2016-05-21 MED ORDER — HEPARIN SOD (PORK) LOCK FLUSH 100 UNIT/ML IV SOLN
500.0000 [IU] | Freq: Once | INTRAVENOUS | Status: AC
  Administered 2016-05-21: 500 [IU] via INTRAVENOUS

## 2016-05-21 MED ORDER — HEPARIN SOD (PORK) LOCK FLUSH 100 UNIT/ML IV SOLN
INTRAVENOUS | Status: AC
  Filled 2016-05-21: qty 5

## 2016-05-21 MED ORDER — SODIUM CHLORIDE 0.9% FLUSH
10.0000 mL | INTRAVENOUS | Status: DC | PRN
  Administered 2016-05-21: 10 mL via INTRAVENOUS
  Filled 2016-05-21: qty 10

## 2016-05-28 ENCOUNTER — Other Ambulatory Visit: Payer: Medicaid Other

## 2016-05-28 ENCOUNTER — Ambulatory Visit: Payer: Medicaid Other

## 2016-05-28 NOTE — Progress Notes (Signed)
[place]  [phone] Fax:(336) [phone]  ID: SOMER TROTTER OB: December 05, 1970  MR#: [id number]  PYP#:[id number]  Patient Care Team: [facility] as PCP - General  CHIEF COMPLAINT: Stage IV left upper outer quadrant lobular breast cancer with ovarian and abdominal metastasis. ER/PR positive, HER-2/neu not overexpressing.  INTERVAL HISTORY: Patient returns to clinic today for further evaluation, discussion of her imaging results, and treatment planning and lab work. She continues to have significant right shoulder pain that is limiting her mobility. She continues to be highly anxious. She otherwise feels well.  She has no neurologic complaints. She denies any fevers. She denies chest pain or shortness of breath. She denies any nausea, vomiting, constipation, or diarrhea. She has no urinary complaints. Patient offers no further specific complaints.  REVIEW OF SYSTEMS:   Review of Systems  Constitutional: Negative.  Negative for fever, weight loss and malaise/fatigue.  Eyes: Negative.   Respiratory: Negative.  Negative for cough and shortness of breath.   Cardiovascular: Negative.  Negative for chest pain and leg swelling.  Gastrointestinal: Negative.  Negative for nausea and abdominal pain.  Genitourinary: Negative.   Musculoskeletal: Positive for joint pain.  Neurological: Negative.  Negative for weakness.  Psychiatric/Behavioral: The patient is nervous/anxious.     As per HPI. Otherwise, a complete review of systems is negatve.  PAST MEDICAL HISTORY: Past Medical History  Diagnosis Date  . Asthma 2005  . Hypertension 2012  . Personal history of tobacco use, presenting hazards to health   . Myocardial infarction ([place]) 10/2012  . Lump or mass in breast 2012    right  . CAD (coronary artery disease)   . MI (myocardial infarction) ([place])   . Cancer [facility]) 2012    breast cancer, right-treated with neoadjunvant chemotherapy  followed by partial mastectomy with sn bx and radiation. Pt is currently taking Tamoxifen since January 2013   . Malignant neoplasm of upper-outer quadrant of female breast ([place]) 2012    right  . Breast cancer ([place])     PAST SURGICAL HISTORY: Past Surgical History  Procedure Laterality Date  . Portacath placement    . Cesarean section  [id number]  . Tubal ligation  2004  . Breast lumpectomy Right 2012  . Finger surgery Left 1986    reconstruction, left pinky  . Wrist surgery Left 1987  . Coronary angioplasty with stent placement  20123    Myocardial infarction  . Left heart catheterization with coronary angiogram N/A 10/16/2012    Procedure: LEFT HEART CATHETERIZATION WITH CORONARY ANGIOGRAM;  Surgeon: Clent Demark, MD;  Location: [facility] CATH LAB;  Service: Cardiovascular;  Laterality: N/A;    FAMILY HISTORY Family History  Problem Relation Age of Onset  . Cancer Maternal Grandmother 69    breast       ADVANCED DIRECTIVES:    HEALTH MAINTENANCE: Social History  Substance Use Topics  . Smoking status: Current Every Day Smoker -- 0.50 packs/day for 15 years    Types: Cigarettes  . Smokeless tobacco: Not on file  . Alcohol Use: No     Allergies  Allergen Reactions  . Codeine Nausea And Vomiting  . Penicillins Rash    Current Outpatient Prescriptions  Medication Sig Dispense Refill  . aspirin EC 81 MG EC tablet Take 1 tablet (81 mg total) by mouth daily. 30 tablet 3  . letrozole (FEMARA) 2.5 MG tablet Take 1 tablet (2.5 mg total) by mouth daily. 30 tablet 5  .  nitroGLYCERIN (NITROSTAT) 0.4 MG SL tablet Place 1 tablet (0.4 mg total) under the tongue every 5 (five) minutes x 3 doses as needed for chest pain. 30 tablet 0  . omeprazole (PRILOSEC) 20 MG capsule Take 1 capsule (20 mg total) by mouth daily. 30 capsule 3  . oxyCODONE-acetaminophen (PERCOCET/ROXICET) 5-325 MG tablet Take 1 tablet by mouth at bedtime as needed for severe pain.    Marland Kitchen prochlorperazine (COMPAZINE)  10 MG tablet Take 1 tablet (10 mg total) by mouth every 6 (six) hours as needed for nausea or vomiting. 30 tablet 0  . albuterol (PROVENTIL HFA;VENTOLIN HFA) 108 (90 BASE) MCG/ACT inhaler Inhale 2 puffs into the lungs every 6 (six) hours as needed for wheezing or shortness of breath. 1 Inhaler 0   No current facility-administered medications for this visit.   Facility-Administered Medications Ordered in Other Visits  Medication Dose Route Frequency Provider Last Rate Last Dose  . sodium chloride 0.9 % injection 10 mL  10 mL Intracatheter PRN Lloyd Huger, MD   10 mL at 04/25/15 1204    OBJECTIVE: Filed Vitals:   05/21/16 0957  BP: 118/89  Pulse: 99  Temp: 98.4 F (36.9 C)     Body mass index is 22.43 kg/(m^2).    ECOG FS:1 - Symptomatic but completely ambulatory  General: Well-developed, well-nourished, no acute distress. Eyes: anicteric sclera. Breasts: Exam deferred today. Lungs: Clear to auscultation bilaterally. Heart: Regular rate and rhythm. No rubs, murmurs, or gallops. Abdomen: Soft, tender to palpation, nondistended.  Musculoskeletal: Minimal right shoulder mobility secondary to pain Neuro: Alert, answering all questions appropriately.  Skin: No rashes or petechiae noted. Psych: Normal affect.   LAB RESULTS:  Lab Results  Component Value Date   NA 138 04/30/2016   K 3.7 04/30/2016   CL 100* 04/30/2016   CO2 30 04/30/2016   GLUCOSE 89 04/30/2016   BUN 7 04/30/2016   CREATININE 0.77 04/30/2016   CALCIUM 8.9 04/30/2016   PROT 7.7 04/30/2016   ALBUMIN 4.4 04/30/2016   AST 15 04/30/2016   ALT 14 04/30/2016   ALKPHOS 68 04/30/2016   BILITOT 0.7 04/30/2016   GFRNONAA >60 04/30/2016   GFRAA >60 04/30/2016    Lab Results  Component Value Date   WBC 9.3 04/30/2016   NEUTROABS 6.9* 04/30/2016   HGB 15.3 04/30/2016   HCT 44.5 04/30/2016   MCV 92.5 04/30/2016   PLT 308 04/30/2016   Lab Results  Component Value Date   LABCA2 59.9* 04/30/2016     STUDIES: Ct Chest W Contrast  05/18/2016  CLINICAL DATA:  RIGHT breast cancer diagnosed 2014. EXAM: CT CHEST, ABDOMEN, AND PELVIS WITH CONTRAST TECHNIQUE: Multidetector CT imaging of the chest, abdomen and pelvis was performed following the standard protocol during bolus administration of intravenous contrast. CONTRAST:  134m ISOVUE-300 IOPAMIDOL (ISOVUE-300) INJECTION 61% COMPARISON:  None. FINDINGS: CT CHEST FINDINGS Mediastinum/Nodes: No axillary or supraclavicular adenopathy. No mediastinal hilar adenopathy. No pericardial fluid. No central pulmonary embolism. Esophagus normal. Asymmetric breast tissue greater on the RIGHT. Lungs/Pleura: Centrilobular emphysema in the upper lobes. New 4 mm pulmonary nodule in the RIGHT upper lobe (image 33, series 4). The similar 3 mm nodule in the RIGHT lower lobe (image 106, series 4). Small mucoid material at the carina.  Otherwise ovaries are normal. Musculoskeletal: No aggressive osseous lesion. CT ABDOMEN AND PELVIS FINDINGS Hepatobiliary: Tiny hypodensities in the superior liver not changed. Normal gallbladder. Pancreas: Pancreas is normal. No ductal dilatation. No pancreatic inflammation. Spleen: Hypodense lesions in  the spleen not changed. Adrenals/urinary tract: Adrenal glands and kidneys are normal. The ureters and bladder normal. Stomach/Bowel: Stomach, small bowel cecum normal. Appendix not identified. The colon and rectosigmoid colon normal. Vascular/Lymphatic: Abdominal aorta is normal caliber with atherosclerotic calcification. There is no retroperitoneal or periportal lymphadenopathy. No pelvic lymphadenopathy. Reproductive: Post hysterectomy. Other: Increasing free fluid in the pelvis which is small moderate in volume. Along the ventral peritoneal surface in the LEFT upper quadrant irregular nodular thickening to 9 x 6 mm (image 68, series 2). Thin linear enhancement of the the peritoneal surface in the pelvis (image 102, series 2). Some haziness to  the ventral peritoneal surface and thin peritoneal enhancement adjacent to the lesser curvature of stomach (image 72 series 2). Musculoskeletal: No aggressive osseous lesion. IMPRESSION: Chest Impression: 1. Two small RIGHT pulmonary nodules less than 5 mm each. Recommend close attention on follow-up. 2. Asymmetric breast tissue as before. Abdomen / Pelvis Impression: 1. Increase in peritoneal fluid. Very mild nodular thickening of the peritoneal surface with enhancement. 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 2.  Atherosclerotic calcification of the abdominal aorta. Electronically Signed   By: Suzy Bouchard M.D.   On: 05/18/2016 15:46   Nm Bone Scan Whole Body  05/18/2016  CLINICAL DATA:  Breast cancer, restaging EXAM: NUCLEAR MEDICINE WHOLE BODY BONE SCAN TECHNIQUE: Whole body anterior and posterior images were obtained approximately 3 hours after intravenous injection of radiopharmaceutical. RADIOPHARMACEUTICALS:  26.3 mCi Technetium-1mMDP IV COMPARISON:  None. FINDINGS: No areas of suspicious bony uptake to suggest metastatic disease. Soft tissue activity is normal. IMPRESSION: No evidence of osseous metastatic disease. Electronically Signed   By: KRolm BaptiseM.D.   On: 05/18/2016 15:16   Ct Abdomen Pelvis W Contrast  05/18/2016  CLINICAL DATA:  RIGHT breast cancer diagnosed 2014. EXAM: CT CHEST, ABDOMEN, AND PELVIS WITH CONTRAST TECHNIQUE: Multidetector CT imaging of the chest, abdomen and pelvis was performed following the standard protocol during bolus administration of intravenous contrast. CONTRAST:  1056mISOVUE-300 IOPAMIDOL (ISOVUE-300) INJECTION 61% COMPARISON:  None. FINDINGS: CT CHEST FINDINGS Mediastinum/Nodes: No axillary or supraclavicular adenopathy. No mediastinal hilar adenopathy. No pericardial fluid. No central pulmonary embolism. Esophagus normal. Asymmetric breast tissue greater on the RIGHT. Lungs/Pleura: Centrilobular emphysema  in the upper lobes. New 4 mm pulmonary nodule in the RIGHT upper lobe (image 33, series 4). The similar 3 mm nodule in the RIGHT lower lobe (image 106, series 4). Small mucoid material at the carina.  Otherwise ovaries are normal. Musculoskeletal: No aggressive osseous lesion. CT ABDOMEN AND PELVIS FINDINGS Hepatobiliary: Tiny hypodensities in the superior liver not changed. Normal gallbladder. Pancreas: Pancreas is normal. No ductal dilatation. No pancreatic inflammation. Spleen: Hypodense lesions in the spleen not changed. Adrenals/urinary tract: Adrenal glands and kidneys are normal. The ureters and bladder normal. Stomach/Bowel: Stomach, small bowel cecum normal. Appendix not identified. The colon and rectosigmoid colon normal. Vascular/Lymphatic: Abdominal aorta is normal caliber with atherosclerotic calcification. There is no retroperitoneal or periportal lymphadenopathy. No pelvic lymphadenopathy. Reproductive: Post hysterectomy. Other: Increasing free fluid in the pelvis which is small moderate in volume. Along the ventral peritoneal surface in the LEFT upper quadrant irregular nodular thickening to 9 x 6 mm (image 68, series 2). Thin linear enhancement of the the peritoneal surface in the pelvis (image 102, series 2). Some haziness to the ventral peritoneal surface and thin peritoneal enhancement adjacent to the lesser curvature of stomach (image 72 series 2). Musculoskeletal: No aggressive osseous lesion. IMPRESSION: Chest Impression:  1. Two small RIGHT pulmonary nodules less than 5 mm each. Recommend close attention on follow-up. 2. Asymmetric breast tissue as before. Abdomen / Pelvis Impression: 1. Increase in peritoneal fluid. Very mild nodular thickening of the peritoneal surface with enhancement. 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 2.  Atherosclerotic calcification of the abdominal aorta. Electronically Signed   By: Suzy Bouchard M.D.   On:  05/18/2016 15:46    ASSESSMENT: Stage IV left upper outer quadrant lobular breast cancer with ovarian and abdominal metastasis. ER/PR positive, HER-2/neu not overexpressing.  PLAN:    1. 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 ER/PR positive, HER-2/neu not overexpressing: CT and bone scan scan results reviewed independently and reported as above with progression of disease. Patient's CA-27-29 is also increasing.  After lengthy discussion, patient wishes to restart chemotherapy with Gemcitabine and Taxotere. Patient will receive gemcitabine and days 1 and 8 and Taxotere on day 8 with On-Pro neulasta support. Continue letrozole until treatment starts on June 25, 2016.  2. Right shoulder pain: Does not appear to be related to underlying cancer.  Will get MRI of right should to further evaluate. Patient will likely need a referral to orthopedics in the future.   Approximately 30 minutes was spent in discussion of which greater than 50% was consultation.   Patient expressed understanding and was in agreement with this plan. She also understands that She can call clinic at any time with any questions, concerns, or complaints.    Lloyd Huger, MD   05/28/2016 8:27 AM

## 2016-06-02 ENCOUNTER — Telehealth: Payer: Self-pay | Admitting: Oncology

## 2016-06-02 ENCOUNTER — Other Ambulatory Visit: Payer: Self-pay | Admitting: *Deleted

## 2016-06-02 ENCOUNTER — Ambulatory Visit: Payer: Medicaid Other | Admitting: Cardiovascular Disease

## 2016-06-02 DIAGNOSIS — M25511 Pain in right shoulder: Secondary | ICD-10-CM

## 2016-06-02 NOTE — Telephone Encounter (Signed)
Patient said she is still waiting for [name] to call in Rx for inhaler and she really needs it badly. She also said to please call in a MIST inhaler, not a dry one. Thank you!

## 2016-06-03 ENCOUNTER — Ambulatory Visit: Admission: RE | Admit: 2016-06-03 | Payer: Medicaid Other | Source: Ambulatory Visit

## 2016-06-03 ENCOUNTER — Other Ambulatory Visit: Payer: Self-pay | Admitting: *Deleted

## 2016-06-03 MED ORDER — ALBUTEROL SULFATE HFA 108 (90 BASE) MCG/ACT IN AERS: 2.0000 | INHALATION_SPRAY | Freq: Four times a day (QID) | RESPIRATORY_TRACT | 0 refills | Status: AC | PRN

## 2016-06-03 NOTE — Telephone Encounter (Signed)
Refill for ProAir inhaler has been sent to patients pharmacy, [facility] in [place]. ProAir is the only albuterol inhaler we are familiar with ordering, I tried to contact patients pharmacy to see if there were other options available and I cannot get through to anyone. Patients pharmacy will have to contact our office if there is alternative for ProAir inhaler.

## 2016-06-04 ENCOUNTER — Ambulatory Visit: Payer: Medicaid Other | Admitting: Oncology

## 2016-06-04 ENCOUNTER — Inpatient Hospital Stay: Payer: Medicaid Other

## 2016-06-14 ENCOUNTER — Ambulatory Visit: Payer: Medicaid Other

## 2016-06-18 ENCOUNTER — Ambulatory Visit: Payer: Medicaid Other

## 2016-06-24 ENCOUNTER — Ambulatory Visit: Payer: Medicaid Other

## 2016-06-24 NOTE — Progress Notes (Signed)
[place]  Telephone:(336) [phone] Fax:(336) [phone]  ID: NEVE BRANSCOMB OB: [birth year]  MR#: [id number]  CSN#:[id number]  Patient Care Team: [facility] as PCP - General  CHIEF COMPLAINT: Stage IV left upper outer quadrant lobular breast cancer with ovarian and abdominal metastasis. ER/PR positive, HER-2/neu not overexpressing.  INTERVAL HISTORY: Patient returns to clinic today for further evaluation and to re-initiate chemotherapy. She continues to have significant right shoulder pain that is limiting her mobility. She continues to be highly anxious. She otherwise feels well.  She has no neurologic complaints. She denies any fevers. She denies chest pain or shortness of breath. She denies any nausea, vomiting, constipation, or diarrhea. She has no urinary complaints. Patient offers no further specific complaints.  REVIEW OF SYSTEMS:   Review of Systems  Constitutional: Negative.  Negative for fever, malaise/fatigue and weight loss.  Eyes: Negative.   Respiratory: Negative.  Negative for cough and shortness of breath.   Cardiovascular: Negative.  Negative for chest pain and leg swelling.  Gastrointestinal: Negative.  Negative for abdominal pain and nausea.  Genitourinary: Negative.   Musculoskeletal: Positive for joint pain.  Neurological: Negative.  Negative for weakness.  Psychiatric/Behavioral: The patient is nervous/anxious.     As per HPI. Otherwise, a complete review of systems is negatve.  PAST MEDICAL HISTORY: Past Medical History:  Diagnosis Date  . Asthma 2005  . Breast cancer ([place])   . CAD (coronary artery disease)   . Cancer [facility]) 2012   breast cancer, right-treated with neoadjunvant chemotherapy followed by partial mastectomy with sn bx and radiation. Pt is currently taking Tamoxifen since January 2013   . Hypertension 2012  . Lump or mass in breast 2012   right  . Malignant neoplasm of upper-outer quadrant of female  breast ([place]) 2012   right  . MI (myocardial infarction) ([place])   . Myocardial infarction ([place]) 10/2012  . Personal history of tobacco use, presenting hazards to health     PAST SURGICAL HISTORY: Past Surgical History:  Procedure Laterality Date  . BREAST LUMPECTOMY Right 2012  . CESAREAN SECTION  [id number]  . CORONARY ANGIOPLASTY WITH STENT PLACEMENT  20123   Myocardial infarction  . FINGER SURGERY Left 1986   reconstruction, left pinky  . LEFT HEART CATHETERIZATION WITH CORONARY ANGIOGRAM N/A 10/16/2012   Procedure: LEFT HEART CATHETERIZATION WITH CORONARY ANGIOGRAM;  Surgeon: Clent Demark, MD;  Location: [facility] CATH LAB;  Service: Cardiovascular;  Laterality: N/A;  . PORTACATH PLACEMENT    . TUBAL LIGATION  2004  . WRIST SURGERY Left 1987    FAMILY HISTORY Family History  Problem Relation Age of Onset  . Cancer Maternal Grandmother 69    breast       ADVANCED DIRECTIVES:    HEALTH MAINTENANCE: Social History  Substance Use Topics  . Smoking status: Current Every Day Smoker    Packs/day: 0.50    Years: 15.00    Types: Cigarettes  . Smokeless tobacco: Not on file  . Alcohol use No     Allergies  Allergen Reactions  . Codeine Nausea And Vomiting  . Penicillins Rash    Current Outpatient Prescriptions  Medication Sig Dispense Refill  . albuterol (PROVENTIL HFA;VENTOLIN HFA) 108 (90 Base) MCG/ACT inhaler Inhale 2 puffs into the lungs every 6 (six) hours as needed for wheezing or shortness of breath. 1 Inhaler 0  . aspirin EC 81 MG EC tablet Take 1 tablet (81 mg total) by mouth daily. [place]  tablet 3  . nitroGLYCERIN (NITROSTAT) 0.4 MG SL tablet Place 1 tablet (0.4 mg total) under the tongue every 5 (five) minutes x 3 doses as needed for chest pain. 30 tablet 0  . omeprazole (PRILOSEC) 20 MG capsule Take 1 capsule (20 mg total) by mouth daily. 30 capsule 3  . oxyCODONE-acetaminophen (PERCOCET/ROXICET) 5-325 MG tablet Take 1 tablet by mouth at bedtime as needed for  severe pain.    Marland Kitchen prochlorperazine (COMPAZINE) 10 MG tablet Take 1 tablet (10 mg total) by mouth every 6 (six) hours as needed for nausea or vomiting. 30 tablet 0   No current facility-administered medications for this visit.     OBJECTIVE: There were no vitals filed for this visit.   Body mass index is 22.05 kg/m.    ECOG FS:1 - Symptomatic but completely ambulatory  General: Well-developed, well-nourished, no acute distress. Eyes: anicteric sclera. Breasts: Exam deferred today. Lungs: Clear to auscultation bilaterally. Heart: Regular rate and rhythm. No rubs, murmurs, or gallops. Abdomen: Soft, tender to palpation, nondistended.  Musculoskeletal: Minimal right shoulder mobility secondary to pain Neuro: Alert, answering all questions appropriately.  Skin: No rashes or petechiae noted. Psych: Normal affect.   LAB RESULTS:  Lab Results  Component Value Date   NA 137 06/25/2016   K 3.4 (L) 06/25/2016   CL 102 06/25/2016   CO2 26 06/25/2016   GLUCOSE 99 06/25/2016   BUN 10 06/25/2016   CREATININE 0.63 06/25/2016   CALCIUM 8.4 (L) 06/25/2016   PROT 6.5 06/25/2016   ALBUMIN 3.9 06/25/2016   AST 13 (L) 06/25/2016   ALT 10 (L) 06/25/2016   ALKPHOS 53 06/25/2016   BILITOT 0.5 06/25/2016   GFRNONAA >60 06/25/2016   GFRAA >60 06/25/2016    Lab Results  Component Value Date   WBC 7.3 06/25/2016   NEUTROABS 5.2 06/25/2016   HGB 13.9 06/25/2016   HCT 40.3 06/25/2016   MCV 92.8 06/25/2016   PLT 223 06/25/2016   Lab Results  Component Value Date   LABCA2 59.9 (H) 04/30/2016    STUDIES: No results found.  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 ER/PR positive, HER-2/neu not overexpressing.  PLAN:    1. 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 ER/PR positive, HER-2/neu not overexpressing: CT and bone scan scan results reviewed independently with  progression of disease. Patient's CA-27-29 is also increasing.  Proceed with gemcitabine on days 1 and 8 and Taxotere on day 8. Patient has requested to hold On-Pro neulasta support. Discontinue letrozole at this time. Return to clinic in 1 week for consideration of cycle 1, day 8. 2. Right shoulder pain: Evaluation and treatment per primary care.    Approximately 30 minutes was spent in discussion of which greater than 50% was consultation.   Patient expressed understanding and was in agreement with this plan. She also understands that She can call clinic at any time with any questions, concerns, or complaints.    Lloyd Huger, MD   06/25/2016 12:24 PM

## 2016-06-25 ENCOUNTER — Inpatient Hospital Stay: Payer: Medicaid Other

## 2016-06-25 ENCOUNTER — Inpatient Hospital Stay (HOSPITAL_BASED_OUTPATIENT_CLINIC_OR_DEPARTMENT_OTHER): Payer: Medicaid Other | Admitting: Oncology

## 2016-06-25 ENCOUNTER — Inpatient Hospital Stay: Payer: Medicaid Other | Attending: Oncology

## 2016-06-25 VITALS — BP 136/79 | HR 90 | Temp 98.1°F | Resp 18

## 2016-06-25 VITALS — Wt 119.8 lb

## 2016-06-25 DIAGNOSIS — M25511 Pain in right shoulder: Secondary | ICD-10-CM | POA: Diagnosis not present

## 2016-06-25 DIAGNOSIS — Z79899 Other long term (current) drug therapy: Secondary | ICD-10-CM | POA: Diagnosis not present

## 2016-06-25 DIAGNOSIS — F17210 Nicotine dependence, cigarettes, uncomplicated: Secondary | ICD-10-CM | POA: Diagnosis not present

## 2016-06-25 DIAGNOSIS — C50912 Malignant neoplasm of unspecified site of left female breast: Secondary | ICD-10-CM

## 2016-06-25 DIAGNOSIS — Z79811 Long term (current) use of aromatase inhibitors: Secondary | ICD-10-CM | POA: Insufficient documentation

## 2016-06-25 DIAGNOSIS — C50412 Malignant neoplasm of upper-outer quadrant of left female breast: Secondary | ICD-10-CM | POA: Insufficient documentation

## 2016-06-25 DIAGNOSIS — I252 Old myocardial infarction: Secondary | ICD-10-CM | POA: Insufficient documentation

## 2016-06-25 DIAGNOSIS — Z7982 Long term (current) use of aspirin: Secondary | ICD-10-CM | POA: Insufficient documentation

## 2016-06-25 DIAGNOSIS — C786 Secondary malignant neoplasm of retroperitoneum and peritoneum: Secondary | ICD-10-CM | POA: Insufficient documentation

## 2016-06-25 DIAGNOSIS — Z170 Estrogen receptor positive status [ER+]: Secondary | ICD-10-CM

## 2016-06-25 DIAGNOSIS — Z923 Personal history of irradiation: Secondary | ICD-10-CM

## 2016-06-25 DIAGNOSIS — J45909 Unspecified asthma, uncomplicated: Secondary | ICD-10-CM | POA: Diagnosis not present

## 2016-06-25 DIAGNOSIS — C7960 Secondary malignant neoplasm of unspecified ovary: Secondary | ICD-10-CM | POA: Insufficient documentation

## 2016-06-25 DIAGNOSIS — I10 Essential (primary) hypertension: Secondary | ICD-10-CM | POA: Diagnosis not present

## 2016-06-25 DIAGNOSIS — I2510 Atherosclerotic heart disease of native coronary artery without angina pectoris: Secondary | ICD-10-CM | POA: Insufficient documentation

## 2016-06-25 DIAGNOSIS — Z5111 Encounter for antineoplastic chemotherapy: Secondary | ICD-10-CM | POA: Diagnosis not present

## 2016-06-25 DIAGNOSIS — Z955 Presence of coronary angioplasty implant and graft: Secondary | ICD-10-CM | POA: Insufficient documentation

## 2016-06-25 LAB — CBC WITH DIFFERENTIAL/PLATELET
BASOS PCT: 1 %
Basophils Absolute: 0.1 10*3/uL (ref 0–0.1)
Eosinophils Absolute: 0.1 10*3/uL (ref 0–0.7)
Eosinophils Relative: 1 %
HCT: 40.3 % (ref 35.0–47.0)
HEMOGLOBIN: 13.9 g/dL (ref 12.0–16.0)
LYMPHS ABS: 1.5 10*3/uL (ref 1.0–3.6)
Lymphocytes Relative: 20 %
MCH: 32 pg (ref 26.0–34.0)
MCHC: 34.5 g/dL (ref 32.0–36.0)
MCV: 92.8 fL (ref 80.0–100.0)
MONOS PCT: 6 %
Monocytes Absolute: 0.4 10*3/uL (ref 0.2–0.9)
NEUTROS ABS: 5.2 10*3/uL (ref 1.4–6.5)
NEUTROS PCT: 72 %
PLATELETS: 223 10*3/uL (ref 150–440)
RBC: 4.34 MIL/uL (ref 3.80–5.20)
RDW: 14.5 % (ref 11.5–14.5)
WBC: 7.3 10*3/uL (ref 3.6–11.0)

## 2016-06-25 LAB — COMPREHENSIVE METABOLIC PANEL
ALBUMIN: 3.9 g/dL (ref 3.5–5.0)
ALT: 10 U/L — ABNORMAL LOW (ref 14–54)
ANION GAP: 9 (ref 5–15)
AST: 13 U/L — ABNORMAL LOW (ref 15–41)
Alkaline Phosphatase: 53 U/L (ref 38–126)
BUN: 10 mg/dL (ref 6–20)
CHLORIDE: 102 mmol/L (ref 101–111)
CO2: 26 mmol/L (ref 22–32)
Calcium: 8.4 mg/dL — ABNORMAL LOW (ref 8.9–10.3)
Creatinine, Ser: 0.63 mg/dL (ref 0.44–1.00)
GFR calc non Af Amer: >60 mL/min (ref >60)
GLUCOSE: 99 mg/dL (ref 65–99)
POTASSIUM: 3.4 mmol/L — AB (ref 3.5–5.1)
SODIUM: 137 mmol/L (ref 135–145)
Total Bilirubin: 0.5 mg/dL (ref 0.3–1.2)
Total Protein: 6.5 g/dL (ref 6.5–8.1)

## 2016-06-25 MED ORDER — HEPARIN SOD (PORK) LOCK FLUSH 100 UNIT/ML IV SOLN
500.0000 [IU] | Freq: Once | INTRAVENOUS | Status: AC
  Administered 2016-06-25: 500 [IU] via INTRAVENOUS

## 2016-06-25 MED ORDER — PROCHLORPERAZINE MALEATE 10 MG PO TABS
10.0000 mg | ORAL_TABLET | Freq: Once | ORAL | Status: AC
  Administered 2016-06-25: 10 mg via ORAL
  Filled 2016-06-25: qty 1

## 2016-06-25 MED ORDER — SODIUM CHLORIDE 0.9 % IV SOLN
1100.0000 mg/m2 | Freq: Once | INTRAVENOUS | Status: AC
  Administered 2016-06-25: 1710 mg via INTRAVENOUS
  Filled 2016-06-25: qty 23.93

## 2016-06-25 MED ORDER — SODIUM CHLORIDE 0.9 % IV SOLN
Freq: Once | INTRAVENOUS | Status: AC
  Administered 2016-06-25: 11:00:00 via INTRAVENOUS
  Filled 2016-06-25: qty 1000

## 2016-06-25 NOTE — Progress Notes (Signed)
Continues to have pain in right shoulder. Has appt on Thursday to schedule MRI scan. States that "knots" in both hands are getting worse.

## 2016-06-26 LAB — CANCER ANTIGEN 27.29: CA 27.29: 65.1 U/mL — AB (ref 0.0–38.6)

## 2016-06-29 ENCOUNTER — Ambulatory Visit: Payer: Medicaid Other | Admitting: Cardiology

## 2016-06-30 NOTE — Progress Notes (Signed)
[name]  [phone] [phone]  ID: [name] OB: 09-22-1971  MR#: [id number]  CSN#:[id number]  Patient Care Team: [facility] as PCP - General  CHIEF COMPLAINT: Stage IV left upper outer quadrant lobular breast cancer with ovarian and abdominal metastasis. ER/PR positive, HER-2/neu not overexpressing.  INTERVAL HISTORY: Patient returns to clinic today for further evaluation and consideration of cycle 1, day 8 of Gemcitabine and Taxotere.  She continues to have significant right shoulder pain that is limiting her mobility. She continues to be highly anxious. She has increased nausea and vomiting after her last infusion, but this has since resolved. She has no neurologic complaints. She denies any fevers. She denies chest pain or shortness of breath. She denies any constipation or diarrhea. She has no urinary complaints. Patient offers no further specific complaints.  REVIEW OF SYSTEMS:   Review of Systems  Constitutional: Positive for malaise/fatigue. Negative for fever and weight loss.  Eyes: Negative.   Respiratory: Negative.  Negative for cough and shortness of breath.   Cardiovascular: Negative.  Negative for chest pain and leg swelling.  Gastrointestinal: Positive for nausea and vomiting. Negative for abdominal pain.  Genitourinary: Negative.   Musculoskeletal: Positive for joint pain.  Neurological: Positive for weakness.  Psychiatric/Behavioral: The patient is nervous/anxious.     As per HPI. Otherwise, a complete review of systems is negatve.  PAST MEDICAL HISTORY: Past Medical History:  Diagnosis Date  . Asthma 2005  . Breast cancer ([place])   . CAD (coronary artery disease)   . Cancer [facility]) 2012   breast cancer, right-treated with neoadjunvant chemotherapy followed by partial mastectomy with sn bx and radiation. Pt is currently taking Tamoxifen since January 2013   . Hypertension 2012  . Lump or mass  in breast 2012   right  . Malignant neoplasm of upper-outer quadrant of female breast ([place]) 2012   right  . MI (myocardial infarction) ([place])   . Myocardial infarction ([place]) 10/2012  . Personal history of tobacco use, presenting hazards to health     PAST SURGICAL HISTORY: Past Surgical History:  Procedure Laterality Date  . BREAST LUMPECTOMY Right 2012  . CESAREAN SECTION  [id number]  . CORONARY ANGIOPLASTY WITH STENT PLACEMENT  20123   Myocardial infarction  . FINGER SURGERY Left 1986   reconstruction, left pinky  . LEFT HEART CATHETERIZATION WITH CORONARY ANGIOGRAM N/A 10/16/2012   Procedure: LEFT HEART CATHETERIZATION WITH CORONARY ANGIOGRAM;  Surgeon: Clent Demark, MD;  Location: [facility] CATH LAB;  Service: Cardiovascular;  Laterality: N/A;  . PORTACATH PLACEMENT    . TUBAL LIGATION  2004  . WRIST SURGERY Left 1987    FAMILY HISTORY Family History  Problem Relation Age of Onset  . Cancer Maternal Grandmother 69    breast       ADVANCED DIRECTIVES:    HEALTH MAINTENANCE: Social History  Substance Use Topics  . Smoking status: Current Every Day Smoker    Packs/day: 0.50    Years: 15.00    Types: Cigarettes  . Smokeless tobacco: Not on file  . Alcohol use No     Allergies  Allergen Reactions  . Codeine Nausea And Vomiting  . Penicillins Rash    Current Outpatient Prescriptions  Medication Sig Dispense Refill  . albuterol (PROVENTIL HFA;VENTOLIN HFA) 108 (90 Base) MCG/ACT inhaler Inhale 2 puffs into the lungs every 6 (six) hours as needed for wheezing or shortness of breath. 1 Inhaler 0  . aspirin  EC 81 MG EC tablet Take 1 tablet (81 mg total) by mouth daily. 30 tablet 3  . nitroGLYCERIN (NITROSTAT) 0.4 MG SL tablet Place 1 tablet (0.4 mg total) under the tongue every 5 (five) minutes x 3 doses as needed for chest pain. 30 tablet 0  . omeprazole (PRILOSEC) 20 MG capsule Take 1 capsule (20 mg total) by mouth daily. 30 capsule 3  . oxyCODONE-acetaminophen  (PERCOCET/ROXICET) 5-325 MG tablet Take 1 tablet by mouth at bedtime as needed for severe pain.    . potassium chloride SA (K-DUR,KLOR-CON) 20 MEQ tablet Take 1 tablet (20 mEq total) by mouth 2 (two) times daily. 60 tablet 3  . prochlorperazine (COMPAZINE) 10 MG tablet Take 1 tablet (10 mg total) by mouth every 6 (six) hours as needed for nausea or vomiting. 30 tablet 0   Current Facility-Administered Medications  Medication Dose Route Frequency Provider Last Rate Last Dose  . palonosetron (ALOXI) injection 0.25 mg  0.25 mg Intravenous Once Lloyd Huger, MD       Facility-Administered Medications Ordered in Other Visits  Medication Dose Route Frequency Provider Last Rate Last Dose  . heparin lock flush 100 unit/mL  500 Units Intravenous Once Lloyd Huger, MD      . sodium chloride flush (NS) 0.9 % injection 10 mL  10 mL Intravenous PRN Lloyd Huger, MD   10 mL at 07/02/16 3546    OBJECTIVE: There were no vitals filed for this visit.   There is no height or weight on file to calculate BMI.    ECOG FS:1 - Symptomatic but completely ambulatory  General: Well-developed, well-nourished, no acute distress. Eyes: anicteric sclera. Breasts: Exam deferred today. Lungs: Clear to auscultation bilaterally. Heart: Regular rate and rhythm. No rubs, murmurs, or gallops. Abdomen: Soft, tender to palpation, nondistended.  Musculoskeletal: Minimal right shoulder mobility secondary to pain Neuro: Alert, answering all questions appropriately.  Skin: No rashes or petechiae noted. Psych: Normal affect.   LAB RESULTS:  Lab Results  Component Value Date   NA 133 (L) 07/02/2016   K 3.1 (L) 07/02/2016   CL 98 (L) 07/02/2016   CO2 27 07/02/2016   GLUCOSE 119 (H) 07/02/2016   BUN 8 07/02/2016   CREATININE 0.61 07/02/2016   CALCIUM 8.4 (L) 07/02/2016   PROT 6.7 07/02/2016   ALBUMIN 3.6 07/02/2016   AST 14 (L) 07/02/2016   ALT 15 07/02/2016   ALKPHOS 48 07/02/2016   BILITOT 0.4  07/02/2016   GFRNONAA >60 07/02/2016   GFRAA >60 07/02/2016    Lab Results  Component Value Date   WBC 2.8 (L) 07/02/2016   NEUTROABS 1.3 (L) 07/02/2016   HGB 12.5 07/02/2016   HCT 36.1 07/02/2016   MCV 92.2 07/02/2016   PLT 156 07/02/2016   Lab Results  Component Value Date   LABCA2 65.1 (H) 06/25/2016    STUDIES: No results found.  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 ER/PR positive, HER-2/neu not overexpressing.  PLAN:    1. 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 ER/PR positive, HER-2/neu not overexpressing: CT and bone scan scan results reviewed independently with progression of disease. Patient's CA-27-29 is also increasing.  Proceed cycle 1, day 8 today. She will receive gemcitabine on days 1 and 8 and Taxotere on day 8. Patient has requested to hold On-Pro neulasta support. Discontinue letrozole at this time. Return to clinic in 2 weeks  for consideration of cycle 2, day 1. Gemcitabine only 2. Right shoulder pain: Evaluation and treatment per primary care.   3. Leukopenia: Secondary to chemotherapy.  Patient has requested to hold Neulasta at the time.  Patient expressed understanding and was in agreement with this plan. She also understands that She can call clinic at any time with any questions, concerns, or complaints.    Lloyd Huger, MD   07/02/2016 1:22 PM

## 2016-07-02 ENCOUNTER — Inpatient Hospital Stay: Payer: Medicaid Other

## 2016-07-02 ENCOUNTER — Inpatient Hospital Stay (HOSPITAL_BASED_OUTPATIENT_CLINIC_OR_DEPARTMENT_OTHER): Payer: Medicaid Other | Admitting: Oncology

## 2016-07-02 VITALS — BP 137/95 | HR 97 | Temp 96.7°F | Resp 16

## 2016-07-02 DIAGNOSIS — C50412 Malignant neoplasm of upper-outer quadrant of left female breast: Secondary | ICD-10-CM

## 2016-07-02 DIAGNOSIS — C50912 Malignant neoplasm of unspecified site of left female breast: Secondary | ICD-10-CM

## 2016-07-02 DIAGNOSIS — Z79811 Long term (current) use of aromatase inhibitors: Secondary | ICD-10-CM

## 2016-07-02 DIAGNOSIS — C7960 Secondary malignant neoplasm of unspecified ovary: Secondary | ICD-10-CM | POA: Diagnosis not present

## 2016-07-02 DIAGNOSIS — F17210 Nicotine dependence, cigarettes, uncomplicated: Secondary | ICD-10-CM

## 2016-07-02 DIAGNOSIS — Z5111 Encounter for antineoplastic chemotherapy: Secondary | ICD-10-CM | POA: Diagnosis not present

## 2016-07-02 DIAGNOSIS — C786 Secondary malignant neoplasm of retroperitoneum and peritoneum: Secondary | ICD-10-CM | POA: Diagnosis not present

## 2016-07-02 DIAGNOSIS — Z170 Estrogen receptor positive status [ER+]: Secondary | ICD-10-CM

## 2016-07-02 DIAGNOSIS — M25511 Pain in right shoulder: Secondary | ICD-10-CM

## 2016-07-02 DIAGNOSIS — Z923 Personal history of irradiation: Secondary | ICD-10-CM

## 2016-07-02 LAB — COMPREHENSIVE METABOLIC PANEL
ALT: 15 U/L (ref 14–54)
ANION GAP: 8 (ref 5–15)
AST: 14 U/L — ABNORMAL LOW (ref 15–41)
Albumin: 3.6 g/dL (ref 3.5–5.0)
Alkaline Phosphatase: 48 U/L (ref 38–126)
BUN: 8 mg/dL (ref 6–20)
CHLORIDE: 98 mmol/L — AB (ref 101–111)
CO2: 27 mmol/L (ref 22–32)
CREATININE: 0.61 mg/dL (ref 0.44–1.00)
Calcium: 8.4 mg/dL — ABNORMAL LOW (ref 8.9–10.3)
Glucose, Bld: 119 mg/dL — ABNORMAL HIGH (ref 65–99)
Potassium: 3.1 mmol/L — ABNORMAL LOW (ref 3.5–5.1)
SODIUM: 133 mmol/L — AB (ref 135–145)
Total Bilirubin: 0.4 mg/dL (ref 0.3–1.2)
Total Protein: 6.7 g/dL (ref 6.5–8.1)

## 2016-07-02 LAB — CBC WITH DIFFERENTIAL/PLATELET
Basophils Absolute: 0 10*3/uL (ref 0–0.1)
Basophils Relative: 1 %
EOS ABS: 0 10*3/uL (ref 0–0.7)
EOS PCT: 1 %
HCT: 36.1 % (ref 35.0–47.0)
Hemoglobin: 12.5 g/dL (ref 12.0–16.0)
LYMPHS ABS: 1.2 10*3/uL (ref 1.0–3.6)
LYMPHS PCT: 43 %
MCH: 31.9 pg (ref 26.0–34.0)
MCHC: 34.6 g/dL (ref 32.0–36.0)
MCV: 92.2 fL (ref 80.0–100.0)
MONO ABS: 0.3 10*3/uL (ref 0.2–0.9)
MONOS PCT: 10 %
Neutro Abs: 1.3 10*3/uL — ABNORMAL LOW (ref 1.4–6.5)
Neutrophils Relative %: 45 %
PLATELETS: 156 10*3/uL (ref 150–440)
RBC: 3.91 MIL/uL (ref 3.80–5.20)
RDW: 13.9 % (ref 11.5–14.5)
WBC: 2.8 10*3/uL — AB (ref 3.6–11.0)

## 2016-07-02 MED ORDER — SODIUM CHLORIDE 0.9 % IV SOLN
10.0000 mg | Freq: Once | INTRAVENOUS | Status: AC
  Administered 2016-07-02: 10 mg via INTRAVENOUS
  Filled 2016-07-02: qty 1

## 2016-07-02 MED ORDER — SODIUM CHLORIDE 0.9% FLUSH
10.0000 mL | INTRAVENOUS | Status: DC | PRN
  Administered 2016-07-02: 10 mL via INTRAVENOUS
  Filled 2016-07-02: qty 10

## 2016-07-02 MED ORDER — HEPARIN SOD (PORK) LOCK FLUSH 100 UNIT/ML IV SOLN
500.0000 [IU] | Freq: Once | INTRAVENOUS | Status: AC
  Administered 2016-07-02: 500 [IU] via INTRAVENOUS
  Filled 2016-07-02: qty 5

## 2016-07-02 MED ORDER — PALONOSETRON HCL INJECTION 0.25 MG/5ML
0.2500 mg | Freq: Once | INTRAVENOUS | Status: AC
  Administered 2016-07-02: 0.25 mg via INTRAVENOUS
  Filled 2016-07-02: qty 5

## 2016-07-02 MED ORDER — SODIUM CHLORIDE 0.9 % IV SOLN
1700.0000 mg | Freq: Once | INTRAVENOUS | Status: DC
Start: 2016-07-02 — End: 2016-07-02

## 2016-07-02 MED ORDER — SODIUM CHLORIDE 0.9 % IV SOLN
Freq: Once | INTRAVENOUS | Status: AC
  Administered 2016-07-02: 11:00:00 via INTRAVENOUS
  Filled 2016-07-02: qty 1000

## 2016-07-02 MED ORDER — POTASSIUM CHLORIDE CRYS ER 20 MEQ PO TBCR: 20.0000 meq | EXTENDED_RELEASE_TABLET | Freq: Two times a day (BID) | ORAL | 3 refills | Status: DC

## 2016-07-02 MED ORDER — DOCETAXEL CHEMO INJECTION 160 MG/16ML
100.0000 mg/m2 | Freq: Once | INTRAVENOUS | Status: AC
  Administered 2016-07-02: 160 mg via INTRAVENOUS
  Filled 2016-07-02: qty 16

## 2016-07-02 MED ORDER — PALONOSETRON HCL INJECTION 0.25 MG/5ML: 0.2500 mg | Freq: Once | INTRAVENOUS | Status: DC

## 2016-07-02 MED ORDER — GEMCITABINE HCL CHEMO INJECTION 1 GM/26.3ML
1100.0000 mg/m2 | Freq: Once | INTRAVENOUS | Status: AC
  Administered 2016-07-02: 1710 mg via INTRAVENOUS
  Filled 2016-07-02: qty 45

## 2016-07-02 NOTE — Progress Notes (Signed)
Patient here today for ongoing follow up and treatment consideration regarding breast cancer. Patient seen by Dr. Grayland Ormond in infusion.

## 2016-07-06 NOTE — Progress Notes (Signed)
Cardiology Office Note   Date:  07/15/2016   ID:  [name], [name] 05-01-1971, MRN [id number]  Referring Doctor:  [facility]   Cardiologist:   Wende Bushy, MD   Reason for consultation:  Chief Complaint  Patient presents with  . Other    Establish care for history of CAD with stent placement in 2013. Meds reviewed by the patient verbally.       History of Present Illness: [name] is a 45 y.o. female who presents for establishing care for CAD.  Pt has hx of CAD s/p anterolateral MI s/p PCI to LAD 10/2012. She would like to transfer care due to proximity of office location.   Pt reports that she is having CP - mild, central chest, tightness, non radiating, randomly occurring, lasts a few minutes at a time. Also has SOB. Ongoing for a few weeks. History not very clear  Ongoing treatment for breast cancer. Denies lightheadedness, syncope, palpitations.   ROS:  Please see the history of present illness. Aside from mentioned under HPI, all other systems are reviewed and negative.     Past Medical History:  Diagnosis Date  . Asthma 2005  . Breast cancer ([place])   . CAD (coronary artery disease)   . Cancer [facility]) 2012   breast cancer, right-treated with neoadjunvant chemotherapy followed by partial mastectomy with sn bx and radiation. Pt is currently taking Tamoxifen since January 2013   . Hypertension 2012  . Lump or mass in breast 2012   right  . Malignant neoplasm of upper-outer quadrant of female breast ([place]) 2012   right  . MI (myocardial infarction) ([place])   . Myocardial infarction ([place]) 10/2012  . Personal history of tobacco use, presenting hazards to health     Past Surgical History:  Procedure Laterality Date  . BREAST LUMPECTOMY Right 2012  . CESAREAN SECTION  [id number]  . CORONARY ANGIOPLASTY WITH STENT PLACEMENT  20123   Myocardial infarction  . FINGER SURGERY Left 1986   reconstruction, left pinky  . LEFT HEART  CATHETERIZATION WITH CORONARY ANGIOGRAM N/A 10/16/2012   Procedure: LEFT HEART CATHETERIZATION WITH CORONARY ANGIOGRAM;  Surgeon: Clent Demark, MD;  Location: [facility] CATH LAB;  Service: Cardiovascular;  Laterality: N/A;  . PORTACATH PLACEMENT    . TUBAL LIGATION  2004  . WRIST SURGERY Left 1987     reports that she has been smoking Cigarettes.  She has a 7.50 pack-year smoking history. She has never used smokeless tobacco. She reports that she does not drink alcohol or use drugs.   family history includes Cancer (age of onset: 64) in her maternal grandmother.   Outpatient Medications Prior to Visit  Medication Sig Dispense Refill  . albuterol (PROVENTIL HFA;VENTOLIN HFA) 108 (90 Base) MCG/ACT inhaler Inhale 2 puffs into the lungs every 6 (six) hours as needed for wheezing or shortness of breath. 1 Inhaler 0  . aspirin EC 81 MG EC tablet Take 1 tablet (81 mg total) by mouth daily. 30 tablet 3  . nitroGLYCERIN (NITROSTAT) 0.4 MG SL tablet Place 1 tablet (0.4 mg total) under the tongue every 5 (five) minutes x 3 doses as needed for chest pain. 30 tablet 0  . omeprazole (PRILOSEC) 20 MG capsule Take 1 capsule (20 mg total) by mouth daily. 30 capsule 3  . oxyCODONE-acetaminophen (PERCOCET/ROXICET) 5-325 MG tablet Take 1 tablet by mouth at bedtime as needed for severe pain.    . potassium chloride SA (  K-DUR,KLOR-CON) 20 MEQ tablet Take 1 tablet (20 mEq total) by mouth 2 (two) times daily. 60 tablet 3  . prochlorperazine (COMPAZINE) 10 MG tablet Take 1 tablet (10 mg total) by mouth every 6 (six) hours as needed for nausea or vomiting. 30 tablet 0   Facility-Administered Medications Prior to Visit  Medication Dose Route Frequency Provider Last Rate Last Dose  . palonosetron (ALOXI) injection 0.25 mg  0.25 mg Intravenous Once Lloyd Huger, MD         Allergies: Codeine and Penicillins    PHYSICAL EXAM: VS:  BP 120/82 (BP Location: Left Arm, Patient Position: Sitting, Cuff Size: Normal)    Pulse 98   Ht 5\' 2"  (1.575 m)   Wt 114 lb (51.7 kg)   BMI 20.85 kg/m  , Body mass index is 20.85 kg/m. Wt Readings from Last 3 Encounters:  07/14/16 114 lb (51.7 kg)  06/25/16 119 lb 13.1 oz (54.3 kg)  05/21/16 121 lb 14.6 oz (55.3 kg)    GENERAL:  well developed, well nourished, not in acute distress HEENT: normocephalic, pink conjunctivae, anicteric sclerae, no xanthelasma, normal dentition, oropharynx clear NECK:  no neck vein engorgement, JVP normal, no hepatojugular reflux, carotid upstroke brisk and symmetric, no bruit, no thyromegaly, no lymphadenopathy LUNGS:  good respiratory effort, clear to auscultation bilaterally CV:  PMI not displaced, no thrills, no lifts, S1 and S2 within normal limits, no palpable S3 or S4, no murmurs, no rubs, no gallops ABD:  Soft, nontender, nondistended, normoactive bowel sounds, no abdominal aortic bruit, no hepatomegaly, no splenomegaly MS: nontender back, no kyphosis, no scoliosis, no joint deformities EXT:  2+ DP/PT pulses, no edema, no varicosities, no cyanosis, no clubbing SKIN: warm, nondiaphoretic, normal turgor, no ulcers NEUROPSYCH: alert, oriented to person, place, and time, sensory/motor grossly intact, normal mood, appropriate affect  Recent Labs: 10/20/2015: Magnesium 1.9 07/02/2016: ALT 15; BUN 8; Creatinine, Ser 0.61; Hemoglobin 12.5; Platelets 156; Potassium 3.1; Sodium 133   Lipid Panel    Component Value Date/Time   CHOL 146 10/17/2012 0520   TRIG 112 10/17/2012 0520   HDL 25 (L) 10/17/2012 0520   CHOLHDL 5.8 10/17/2012 0520   VLDL 22 10/17/2012 0520   LDLCALC 99 10/17/2012 0520     Other studies Reviewed:  EKG:  The ekg from 07/14/2016 was personally reviewed by me and it revealed sinus rhythm, 90 BPM, ST and T-wave abnormality, T-wave inversions in the anterolateral leads.  Additional studies/ records that were reviewed personally reviewed by me today include: None available   ASSESSMENT AND PLAN: CAD s/p  Anterolateral wall MI, s/p PCI to LAD 10/2012 CP SOB Rec further eval with pharmacologic stress test. Check echo. In meantime cont meds. Not on BB or ACE-I -- per pt, could not tolerate due to low BP. Not on statin -- awaiting records from prev cardiologist.  Tobacco use We discussed the importance of smoking cessation and different strategies for quitting.    Current medicines are reviewed at length with the patient today.  The patient does not have concerns regarding medicines.  Labs/ tests ordered today include:  Orders Placed This Encounter  Procedures  . NM Myocar Multi W/Spect W/Wall Motion / EF  . EKG 12-Lead  . ECHOCARDIOGRAM COMPLETE    I had a lengthy and detailed discussion with the patient regarding diagnoses, prognosis, diagnostic options, treatment options , and side effects of medications.   I counseled the patient on importance of lifestyle modification including heart healthy diet, regular physical  activity, and smoking cessation.   Disposition:   FU with undersigned after tests    Signed, Wende Bushy, MD  07/15/2016 1:53 PM    [place]  This note was generated in part with voice recognition software and I apologize for any typographical errors that were not detected and corrected.

## 2016-07-07 ENCOUNTER — Encounter: Payer: Self-pay | Admitting: Orthopaedic Surgery

## 2016-07-08 ENCOUNTER — Telehealth: Payer: Self-pay | Admitting: *Deleted

## 2016-07-08 NOTE — Telephone Encounter (Signed)
Called to report that she is having "real bad pain" form her ribs to her thighs. She had chemo Friday. Asking what she can take for the pain which is "nonstop". She has oxycodone to take at bedtime for severe pain

## 2016-07-08 NOTE — Telephone Encounter (Signed)
Patient advised per VO Dr Grayland Ormond to take her Oxycodone twice a day and he will discuss further at appt next Friday

## 2016-07-14 ENCOUNTER — Encounter: Payer: Self-pay | Admitting: Cardiology

## 2016-07-14 ENCOUNTER — Ambulatory Visit (INDEPENDENT_AMBULATORY_CARE_PROVIDER_SITE_OTHER): Payer: Medicaid Other | Admitting: Cardiology

## 2016-07-14 VITALS — BP 120/82 | HR 98 | Ht 62.0 in | Wt 114.0 lb

## 2016-07-14 DIAGNOSIS — F17200 Nicotine dependence, unspecified, uncomplicated: Secondary | ICD-10-CM | POA: Diagnosis not present

## 2016-07-14 DIAGNOSIS — R079 Chest pain, unspecified: Secondary | ICD-10-CM

## 2016-07-14 DIAGNOSIS — I2510 Atherosclerotic heart disease of native coronary artery without angina pectoris: Secondary | ICD-10-CM

## 2016-07-14 DIAGNOSIS — R0602 Shortness of breath: Secondary | ICD-10-CM | POA: Diagnosis not present

## 2016-07-14 NOTE — Patient Instructions (Addendum)
Testing/Procedures: Your physician has requested that you have an echocardiogram. Echocardiography is a painless test that uses sound waves to create images of your heart. It provides your doctor with information about the size and shape of your heart and how well your heart's chambers and valves are working. This procedure takes approximately one hour. There are no restrictions for this procedure.  [place]  Your caregiver has ordered a Stress Test with nuclear imaging. The purpose of this test is to evaluate the blood supply to your heart muscle. This procedure is referred to as a "Non-Invasive Stress Test." This is because other than having an IV started in your vein, nothing is inserted or "invades" your body. Cardiac stress tests are done to find areas of poor blood flow to the heart by determining the extent of coronary artery disease (CAD). Some patients exercise on a treadmill, which naturally increases the blood flow to your heart, while others who are  unable to walk on a treadmill due to physical limitations have a pharmacologic/chemical stress agent called Lexiscan . This medicine will mimic walking on a treadmill by temporarily increasing your coronary blood flow.   Please note: these test may take anywhere between 2-4 hours to complete  PLEASE REPORT TO [place] AT THE FIRST DESK WILL DIRECT YOU WHERE TO GO  Date of Procedure:__Wednesday July 28, 2016 at 08:30AM___  Arrival Time for Procedure:___Arrive at 08:15AM to register_____    PLEASE NOTIFY THE OFFICE AT LEAST 24 HOURS IN ADVANCE IF YOU ARE UNABLE TO [place].  [phone] AND  PLEASE NOTIFY NUCLEAR MEDICINE AT [facility] AT LEAST 24 HOURS IN ADVANCE IF YOU ARE UNABLE TO KEEP YOUR APPOINTMENT. [phone]    How to prepare for your Myoview test:  1. Do not eat or drink after midnight 2. No caffeine for 24 hours prior to test 3. No smoking 24 hours prior to  test. 4. Your medication may be taken with water.  If your doctor stopped a medication because of this test, do not take that medication. 5. Ladies, please do not wear dresses.  Skirts or pants are appropriate. Please wear a short sleeve shirt. 6. No perfume, cologne or lotion. 7. Wear comfortable walking shoes. No heels!      Follow-Up: Your physician recommends that you schedule a follow-up appointment after testing with Dr. Yvone Neu.  It was a pleasure seeing you today here in the office. Please do not hesitate to give [place] a call back if you have any further questions. [place], BSN       Echocardiogram An echocardiogram, or echocardiography, uses sound waves (ultrasound) to produce an image of your heart. The echocardiogram is simple, painless, obtained within a short period of time, and offers valuable information to your health care provider. The images from an echocardiogram can provide information such as:  Evidence of coronary artery disease (CAD).  Heart size.  Heart muscle function.  Heart valve function.  Aneurysm detection.  Evidence of a past heart attack.  Fluid buildup around the heart.  Heart muscle thickening.  Assess heart valve function. LET [facility] CARE PROVIDER KNOW ABOUT:  Any allergies you have.  All medicines you are taking, including vitamins, herbs, eye drops, creams, and over-the-counter medicines.  Previous problems you or members of your family have had with the use of anesthetics.  Any blood disorders you have.  Previous surgeries you have had.  Medical conditions you have.  Possibility of pregnancy, if this applies. BEFORE THE PROCEDURE  No special preparation is needed. Eat and drink normally.  PROCEDURE   In order to produce an image of your heart, gel will be applied to your chest and a wand-like tool (transducer) will be moved over your chest. The gel will help transmit the sound waves from the  transducer. The sound waves will harmlessly bounce off your heart to allow the heart images to be captured in real-time motion. These images will then be recorded.  You may need an IV to receive a medicine that improves the quality of the pictures. AFTER THE PROCEDURE You may return to your normal schedule including diet, activities, and medicines, unless your health care provider tells you otherwise.   This information is not intended to replace advice given to you by your health care provider. Make sure you discuss any questions you have with your health care provider.   Document Released: 10/22/2000 Document Revised: 11/15/2014 Document Reviewed: 07/02/2013 Elsevier Interactive Patient Education 2016 [place].     Pharmacologic Stress Electrocardiogram A pharmacologic stress electrocardiogram is a heart (cardiac) test that uses nuclear imaging to evaluate the blood supply to your heart. This test may also be called a pharmacologic stress electrocardiography. Pharmacologic means that a medicine is used to increase your heart rate and blood pressure.  This stress test is done to find areas of poor blood flow to the heart by determining the extent of coronary artery disease (CAD). Some people exercise on a treadmill, which naturally increases the blood flow to the heart. For those people unable to exercise on a treadmill, a medicine is used. This medicine stimulates your heart and will cause your heart to beat harder and more quickly, as if you were exercising.  Pharmacologic stress tests can help determine:  The adequacy of blood flow to your heart during increased levels of activity in order to clear you for discharge home.  The extent of coronary artery blockage caused by CAD.  Your prognosis if you have suffered a heart attack.  The effectiveness of cardiac procedures done, such as an angioplasty, which can increase the circulation in your coronary arteries.  Causes of chest pain  or pressure. LET [facility] CARE PROVIDER KNOW ABOUT:  Any allergies you have.  All medicines you are taking, including vitamins, herbs, eye drops, creams, and over-the-counter medicines.  Previous problems you or members of your family have had with the use of anesthetics.  Any blood disorders you have.  Previous surgeries you have had.  Medical conditions you have.  Possibility of pregnancy, if this applies.  If you are currently breastfeeding. RISKS AND COMPLICATIONS Generally, this is a safe procedure. However, as with any procedure, complications can occur. Possible complications include:  You develop pain or pressure in the following areas:  Chest.  Jaw or neck.  Between your shoulder blades.  Radiating down your left arm.  Headache.  Dizziness or light-headedness.  Shortness of breath.  Increased or irregular heartbeat.  Low blood pressure.  Nausea or vomiting.  Flushing.  Redness going up the arm and slight pain during injection of medicine.  Heart attack (rare). BEFORE THE PROCEDURE   Avoid all forms of caffeine for 24 hours before your test or as directed by your health care provider. This includes coffee, tea (even decaffeinated tea), caffeinated sodas, chocolate, cocoa, and certain pain medicines.  Follow your health care provider's instructions regarding eating and drinking before the test.  Take your medicines  as directed at regular times with water unless instructed otherwise. Exceptions may include:  If you have diabetes, ask how you are to take your insulin or pills. It is common to adjust insulin dosing the morning of the test.  If you are taking beta-blocker medicines, it is important to talk to your health care provider about these medicines well before the date of your test. Taking beta-blocker medicines may interfere with the test. In some cases, these medicines need to be changed or stopped 24 hours or more before the test.  If you  wear a nitroglycerin patch, it may need to be removed prior to the test. Ask your health care provider if the patch should be removed before the test.  If you use an inhaler for any breathing condition, bring it with you to the test.  If you are an outpatient, bring a snack so you can eat right after the stress phase of the test.  Do not smoke for 4 hours prior to the test or as directed by your health care provider.  Do not apply lotions, powders, creams, or oils on your chest prior to the test.  Wear comfortable shoes and clothing. Let your health care provider know if you were unable to complete or follow the preparations for your test. PROCEDURE   Multiple patches (electrodes) will be put on your chest. If needed, small areas of your chest may be shaved to get better contact with the electrodes. Once the electrodes are attached to your body, multiple wires will be attached to the electrodes, and your heart rate will be monitored.  An IV access will be started. A nuclear trace (isotope) is given. The isotope may be given intravenously, or it may be swallowed. Nuclear refers to several types of radioactive isotopes, and the nuclear isotope lights up the arteries so that the nuclear images are clear. The isotope is absorbed by your body. This results in low radiation exposure.  A resting nuclear image is taken to show how your heart functions at rest.  A medicine is given through the IV access.  A second scan is done about 1 hour after the medicine injection and determines how your heart functions under stress.  During this stress phase, you will be connected to an electrocardiogram machine. Your blood pressure and oxygen levels will be monitored. AFTER THE PROCEDURE   Your heart rate and blood pressure will be monitored after the test.  You may return to your normal schedule, including diet,activities, and medicines, unless your health care provider tells you otherwise.   This  information is not intended to replace advice given to you by your health care provider. Make sure you discuss any questions you have with your health care provider.   Document Released: 03/13/2009 Document Revised: 10/30/2013 Document Reviewed: 07/02/2013 Elsevier Interactive Patient Education [employer].

## 2016-07-15 ENCOUNTER — Ambulatory Visit: Payer: Medicaid Other | Admitting: Orthopaedic Surgery

## 2016-07-15 ENCOUNTER — Encounter: Payer: Self-pay | Admitting: Orthopedic Surgery

## 2016-07-15 NOTE — Progress Notes (Signed)
[name]  [phone] [phone]) [phone]  ID: [name] OB: 23-Dec-1970  MR#: [id number]  CSN#:[id number]  Patient Care Team: [facility] as PCP - General  CHIEF COMPLAINT: Stage IV left upper outer quadrant lobular breast cancer with ovarian and abdominal metastasis. ER/PR positive, HER-2/neu not overexpressing.  INTERVAL HISTORY: Patient returns to clinic today for further evaluation and consideration of cycle 2, day 1 of Gemcitabine and Taxotere.  Gemcitabine only today. She does not complain of pain today. She continues to be highly anxious. She has increased nausea and vomiting for several days after treatment, but this has since resolved. She has no neurologic complaints. She denies any fevers. She denies chest pain or shortness of breath. She denies any constipation or diarrhea. She has no urinary complaints. Patient offers no further specific complaints.  REVIEW OF SYSTEMS:   Review of Systems  Constitutional: Positive for malaise/fatigue. Negative for fever and weight loss.  Eyes: Negative.   Respiratory: Negative.  Negative for cough and shortness of breath.   Cardiovascular: Negative.  Negative for chest pain and leg swelling.  Gastrointestinal: Positive for nausea and vomiting. Negative for abdominal pain.  Genitourinary: Negative.   Musculoskeletal: Positive for joint pain.  Neurological: Positive for weakness.  Psychiatric/Behavioral: The patient is nervous/anxious.     As per HPI. Otherwise, a complete review of systems is negative.  PAST MEDICAL HISTORY: Past Medical History:  Diagnosis Date  . Asthma 2005  . Breast cancer ([place])   . CAD (coronary artery disease)   . Cancer [facility]) 2012   breast cancer, right-treated with neoadjunvant chemotherapy followed by partial mastectomy with sn bx and radiation. Pt is currently taking Tamoxifen since January 2013   . Hypertension 2012  . Lump or mass in breast  2012   right  . Malignant neoplasm of upper-outer quadrant of female breast ([place]) 2012   right  . MI (myocardial infarction) ([place])   . Myocardial infarction ([place]) 10/2012  . Personal history of tobacco use, presenting hazards to health     PAST SURGICAL HISTORY: Past Surgical History:  Procedure Laterality Date  . BREAST LUMPECTOMY Right 2012  . CESAREAN SECTION  [id number]  . CORONARY ANGIOPLASTY WITH STENT PLACEMENT  20123   Myocardial infarction  . FINGER SURGERY Left 1986   reconstruction, left pinky  . LEFT HEART CATHETERIZATION WITH CORONARY ANGIOGRAM N/A 10/16/2012   Procedure: LEFT HEART CATHETERIZATION WITH CORONARY ANGIOGRAM;  Surgeon: Clent Demark, MD;  Location: [place] CATH LAB;  Service: Cardiovascular;  Laterality: N/A;  . PORTACATH PLACEMENT    . TUBAL LIGATION  2004  . WRIST SURGERY Left 1987    FAMILY HISTORY Family History  Problem Relation Age of Onset  . Cancer Maternal Grandmother 69    breast       ADVANCED DIRECTIVES:    HEALTH MAINTENANCE: Social History  Substance Use Topics  . Smoking status: Current Every Day Smoker    Packs/day: 0.50    Years: 15.00    Types: Cigarettes  . Smokeless tobacco: Never Used  . Alcohol use No     Allergies  Allergen Reactions  . Codeine Nausea And Vomiting  . Penicillins Rash    Current Outpatient Prescriptions  Medication Sig Dispense Refill  . albuterol (PROVENTIL HFA;VENTOLIN HFA) 108 (90 Base) MCG/ACT inhaler Inhale 2 puffs into the lungs every 6 (six) hours as needed for wheezing or shortness of breath. 1 Inhaler 0  . aspirin EC 81 MG  EC tablet Take 1 tablet (81 mg total) by mouth daily. 30 tablet 3  . nitroGLYCERIN (NITROSTAT) 0.4 MG SL tablet Place 1 tablet (0.4 mg total) under the tongue every 5 (five) minutes x 3 doses as needed for chest pain. 30 tablet 0  . omeprazole (PRILOSEC) 20 MG capsule Take 1 capsule (20 mg total) by mouth daily. 30 capsule 3  . oxyCODONE-acetaminophen (PERCOCET/ROXICET)  5-325 MG tablet Take 1 tablet by mouth at bedtime as needed for severe pain. 30 tablet 0  . potassium chloride SA (K-DUR,KLOR-CON) 20 MEQ tablet Take 1 tablet (20 mEq total) by mouth 2 (two) times daily. 60 tablet 3  . prochlorperazine (COMPAZINE) 10 MG tablet Take 1 tablet (10 mg total) by mouth every 6 (six) hours as needed for nausea or vomiting. 30 tablet 0   Current Facility-Administered Medications  Medication Dose Route Frequency Provider Last Rate Last Dose  . palonosetron (ALOXI) injection 0.25 mg  0.25 mg Intravenous Once Lloyd Huger, MD        OBJECTIVE: There were no vitals filed for this visit.   There is no height or weight on file to calculate BMI.    ECOG FS:1 - Symptomatic but completely ambulatory  General: Well-developed, well-nourished, no acute distress. Eyes: anicteric sclera. Breasts: Exam deferred today. Lungs: Clear to auscultation bilaterally. Heart: Regular rate and rhythm. No rubs, murmurs, or gallops. Abdomen: Soft, tender to palpation, nondistended.  Musculoskeletal: Minimal right shoulder mobility secondary to pain Neuro: Alert, answering all questions appropriately.  Skin: No rashes or petechiae noted. Psych: Normal affect.   LAB RESULTS:  Lab Results  Component Value Date   NA 135 07/16/2016   K 3.0 (L) 07/16/2016   CL 98 (L) 07/16/2016   CO2 27 07/16/2016   GLUCOSE 117 (H) 07/16/2016   BUN <5 (L) 07/16/2016   CREATININE 0.60 07/16/2016   CALCIUM 8.0 (L) 07/16/2016   PROT 6.4 (L) 07/16/2016   ALBUMIN 3.1 (L) 07/16/2016   AST 15 07/16/2016   ALT 16 07/16/2016   ALKPHOS 70 07/16/2016   BILITOT 0.2 (L) 07/16/2016   GFRNONAA >60 07/16/2016   GFRAA >60 07/16/2016    Lab Results  Component Value Date   WBC 8.1 07/16/2016   NEUTROABS 4.9 07/16/2016   HGB 12.1 07/16/2016   HCT 35.1 07/16/2016   MCV 92.2 07/16/2016   PLT 631 (H) 07/16/2016   Lab Results  Component Value Date   LABCA2 65.1 (H) 06/25/2016    STUDIES: No results  found.  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 ER/PR positive, HER-2/neu not overexpressing.  PLAN:    1. 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 ER/PR positive, HER-2/neu not overexpressing: CT and bone scan scan results reviewed independently with progression of disease. Patient's CA-27-29 continues to trend up slightly, today's result is pending.  Proceed cycle 2, day 1 today. She will receive gemcitabine on days 1 and 8 and Taxotere on day 8. Patient has requested to hold On-Pro neulasta support. Discontinue letrozole at this time. Return to clinic in 1 week for consideration of cycle 2, day 8 which will be given in [place] and then in 3 weeks for consideration of cycle 3, day 1. Gemcitabine only. 2. Right shoulder pain: Continue evaluation and treatment per primary care.   3. Leukopenia: Resolved. Secondary to chemotherapy.  Patient has requested to hold Neulasta at the time. 4. Thrombocytosis: Likely reactive,  monitor.  Patient expressed understanding and was in agreement with this plan. She also understands that She can call clinic at any time with any questions, concerns, or complaints.    Lloyd Huger, MD   07/18/2016 11:53 AM

## 2016-07-16 ENCOUNTER — Inpatient Hospital Stay: Payer: Medicaid Other | Attending: Oncology | Admitting: Oncology

## 2016-07-16 ENCOUNTER — Inpatient Hospital Stay: Payer: Medicaid Other

## 2016-07-16 DIAGNOSIS — C50412 Malignant neoplasm of upper-outer quadrant of left female breast: Secondary | ICD-10-CM

## 2016-07-16 DIAGNOSIS — I10 Essential (primary) hypertension: Secondary | ICD-10-CM | POA: Insufficient documentation

## 2016-07-16 DIAGNOSIS — Z9011 Acquired absence of right breast and nipple: Secondary | ICD-10-CM | POA: Insufficient documentation

## 2016-07-16 DIAGNOSIS — C7960 Secondary malignant neoplasm of unspecified ovary: Secondary | ICD-10-CM | POA: Diagnosis not present

## 2016-07-16 DIAGNOSIS — I2510 Atherosclerotic heart disease of native coronary artery without angina pectoris: Secondary | ICD-10-CM | POA: Insufficient documentation

## 2016-07-16 DIAGNOSIS — C50912 Malignant neoplasm of unspecified site of left female breast: Secondary | ICD-10-CM

## 2016-07-16 DIAGNOSIS — Z5111 Encounter for antineoplastic chemotherapy: Secondary | ICD-10-CM | POA: Diagnosis present

## 2016-07-16 DIAGNOSIS — Z170 Estrogen receptor positive status [ER+]: Secondary | ICD-10-CM | POA: Diagnosis not present

## 2016-07-16 DIAGNOSIS — D696 Thrombocytopenia, unspecified: Secondary | ICD-10-CM | POA: Diagnosis not present

## 2016-07-16 DIAGNOSIS — C7989 Secondary malignant neoplasm of other specified sites: Secondary | ICD-10-CM | POA: Insufficient documentation

## 2016-07-16 DIAGNOSIS — Z79899 Other long term (current) drug therapy: Secondary | ICD-10-CM | POA: Diagnosis not present

## 2016-07-16 DIAGNOSIS — Z923 Personal history of irradiation: Secondary | ICD-10-CM | POA: Diagnosis not present

## 2016-07-16 DIAGNOSIS — C50419 Malignant neoplasm of upper-outer quadrant of unspecified female breast: Secondary | ICD-10-CM

## 2016-07-16 DIAGNOSIS — I252 Old myocardial infarction: Secondary | ICD-10-CM | POA: Insufficient documentation

## 2016-07-16 DIAGNOSIS — Z79811 Long term (current) use of aromatase inhibitors: Secondary | ICD-10-CM | POA: Insufficient documentation

## 2016-07-16 DIAGNOSIS — M25511 Pain in right shoulder: Secondary | ICD-10-CM | POA: Diagnosis not present

## 2016-07-16 LAB — CBC WITH DIFFERENTIAL/PLATELET
BASOS ABS: 0 10*3/uL (ref 0–0.1)
Basophils Relative: 1 %
Eosinophils Absolute: 0 10*3/uL (ref 0–0.7)
Eosinophils Relative: 0 %
HEMATOCRIT: 35.1 % (ref 35.0–47.0)
Hemoglobin: 12.1 g/dL (ref 12.0–16.0)
Lymphs Abs: 1.6 10*3/uL (ref 1.0–3.6)
MCH: 31.8 pg (ref 26.0–34.0)
MCHC: 34.5 g/dL (ref 32.0–36.0)
MCV: 92.2 fL (ref 80.0–100.0)
MONO ABS: 1.5 10*3/uL — AB (ref 0.2–0.9)
Monocytes Relative: 19 %
NEUTROS ABS: 4.9 10*3/uL (ref 1.4–6.5)
Neutrophils Relative %: 60 %
Platelets: 631 10*3/uL — ABNORMAL HIGH (ref 150–440)
RBC: 3.81 MIL/uL (ref 3.80–5.20)
RDW: 14.6 % — AB (ref 11.5–14.5)
WBC: 8.1 10*3/uL (ref 3.6–11.0)

## 2016-07-16 LAB — COMPREHENSIVE METABOLIC PANEL
ALK PHOS: 70 U/L (ref 38–126)
ALT: 16 U/L (ref 14–54)
AST: 15 U/L (ref 15–41)
Albumin: 3.1 g/dL — ABNORMAL LOW (ref 3.5–5.0)
Anion gap: 10 (ref 5–15)
BILIRUBIN TOTAL: 0.2 mg/dL — AB (ref 0.3–1.2)
CALCIUM: 8 mg/dL — AB (ref 8.9–10.3)
CO2: 27 mmol/L (ref 22–32)
CREATININE: 0.6 mg/dL (ref 0.44–1.00)
Chloride: 98 mmol/L — ABNORMAL LOW (ref 101–111)
GFR calc Af Amer: >60 mL/min (ref >60)
Glucose, Bld: 117 mg/dL — ABNORMAL HIGH (ref 65–99)
Potassium: 3 mmol/L — ABNORMAL LOW (ref 3.5–5.1)
Sodium: 135 mmol/L (ref 135–145)
TOTAL PROTEIN: 6.4 g/dL — AB (ref 6.5–8.1)

## 2016-07-16 MED ORDER — SODIUM CHLORIDE 0.9 % IV SOLN
Freq: Once | INTRAVENOUS | Status: AC
  Administered 2016-07-16: 11:00:00 via INTRAVENOUS
  Filled 2016-07-16: qty 1000

## 2016-07-16 MED ORDER — SODIUM CHLORIDE 0.9 % IV SOLN
1100.0000 mg/m2 | Freq: Once | INTRAVENOUS | Status: AC
  Administered 2016-07-16: 1710 mg via INTRAVENOUS
  Filled 2016-07-16: qty 45

## 2016-07-16 MED ORDER — HEPARIN SOD (PORK) LOCK FLUSH 100 UNIT/ML IV SOLN
500.0000 [IU] | Freq: Once | INTRAVENOUS | Status: AC | PRN
  Administered 2016-07-16: 500 [IU]

## 2016-07-16 MED ORDER — NITROGLYCERIN 0.4 MG SL SUBL: 0.4000 mg | SUBLINGUAL_TABLET | SUBLINGUAL | 0 refills | Status: DC | PRN

## 2016-07-16 MED ORDER — SODIUM CHLORIDE 0.9% FLUSH
10.0000 mL | INTRAVENOUS | Status: DC | PRN
  Administered 2016-07-16: 10 mL
  Filled 2016-07-16: qty 10

## 2016-07-16 MED ORDER — PROCHLORPERAZINE MALEATE 10 MG PO TABS
10.0000 mg | ORAL_TABLET | Freq: Once | ORAL | Status: AC
  Administered 2016-07-16: 10 mg via ORAL
  Filled 2016-07-16: qty 1

## 2016-07-16 MED ORDER — OXYCODONE-ACETAMINOPHEN 5-325 MG PO TABS: 1.0000 | ORAL_TABLET | Freq: Every evening | ORAL | 0 refills | Status: DC | PRN

## 2016-07-16 NOTE — Progress Notes (Signed)
Pt was seen in infusion area.

## 2016-07-23 ENCOUNTER — Inpatient Hospital Stay: Payer: Medicaid Other

## 2016-07-23 ENCOUNTER — Encounter (INDEPENDENT_AMBULATORY_CARE_PROVIDER_SITE_OTHER): Payer: Self-pay

## 2016-07-23 VITALS — BP 136/93 | HR 88 | Temp 98.3°F | Resp 16

## 2016-07-23 DIAGNOSIS — C50412 Malignant neoplasm of upper-outer quadrant of left female breast: Secondary | ICD-10-CM

## 2016-07-23 DIAGNOSIS — Z5111 Encounter for antineoplastic chemotherapy: Secondary | ICD-10-CM | POA: Diagnosis not present

## 2016-07-23 DIAGNOSIS — Z95828 Presence of other vascular implants and grafts: Secondary | ICD-10-CM

## 2016-07-23 DIAGNOSIS — C50912 Malignant neoplasm of unspecified site of left female breast: Secondary | ICD-10-CM

## 2016-07-23 LAB — CBC WITH DIFFERENTIAL/PLATELET
BASOS PCT: 1 %
Basophils Absolute: 0.1 10*3/uL (ref 0–0.1)
EOS ABS: 0 10*3/uL (ref 0–0.7)
EOS PCT: 0 %
HCT: 31.2 % — ABNORMAL LOW (ref 35.0–47.0)
HEMOGLOBIN: 11 g/dL — AB (ref 12.0–16.0)
Lymphocytes Relative: 25 %
Lymphs Abs: 1.7 10*3/uL (ref 1.0–3.6)
MCH: 32.4 pg (ref 26.0–34.0)
MCHC: 35.4 g/dL (ref 32.0–36.0)
MCV: 91.5 fL (ref 80.0–100.0)
Monocytes Absolute: 1 10*3/uL — ABNORMAL HIGH (ref 0.2–0.9)
Monocytes Relative: 15 %
NEUTROS PCT: 59 %
Neutro Abs: 3.9 10*3/uL (ref 1.4–6.5)
PLATELETS: 494 10*3/uL — AB (ref 150–440)
RBC: 3.41 MIL/uL — AB (ref 3.80–5.20)
RDW: 14.2 % (ref 11.5–14.5)
WBC: 6.6 10*3/uL (ref 3.6–11.0)

## 2016-07-23 LAB — COMPREHENSIVE METABOLIC PANEL
ALK PHOS: 55 U/L (ref 38–126)
ALT: 17 U/L (ref 14–54)
ANION GAP: 4 — AB (ref 5–15)
AST: 19 U/L (ref 15–41)
Albumin: 3.4 g/dL — ABNORMAL LOW (ref 3.5–5.0)
BILIRUBIN TOTAL: 0.3 mg/dL (ref 0.3–1.2)
BUN: 7 mg/dL (ref 6–20)
CALCIUM: 8.1 mg/dL — AB (ref 8.9–10.3)
CO2: 26 mmol/L (ref 22–32)
Chloride: 104 mmol/L (ref 101–111)
Creatinine, Ser: 0.48 mg/dL (ref 0.44–1.00)
Glucose, Bld: 92 mg/dL (ref 65–99)
POTASSIUM: 3.5 mmol/L (ref 3.5–5.1)
Sodium: 134 mmol/L — ABNORMAL LOW (ref 135–145)
TOTAL PROTEIN: 6.4 g/dL — AB (ref 6.5–8.1)

## 2016-07-23 MED ORDER — SODIUM CHLORIDE 0.9 % IV SOLN
10.0000 mg | Freq: Once | INTRAVENOUS | Status: AC
  Administered 2016-07-23: 10 mg via INTRAVENOUS
  Filled 2016-07-23: qty 1

## 2016-07-23 MED ORDER — HEPARIN SOD (PORK) LOCK FLUSH 100 UNIT/ML IV SOLN
500.0000 [IU] | Freq: Once | INTRAVENOUS | Status: AC
  Administered 2016-07-23: 500 [IU] via INTRAVENOUS
  Filled 2016-07-23: qty 5

## 2016-07-23 MED ORDER — SODIUM CHLORIDE 0.9% FLUSH
10.0000 mL | INTRAVENOUS | Status: DC | PRN
  Administered 2016-07-23: 10 mL via INTRAVENOUS
  Filled 2016-07-23: qty 10

## 2016-07-23 MED ORDER — DOCETAXEL CHEMO INJECTION 160 MG/16ML
100.0000 mg/m2 | Freq: Once | INTRAVENOUS | Status: AC
  Administered 2016-07-23: 160 mg via INTRAVENOUS
  Filled 2016-07-23: qty 16

## 2016-07-23 MED ORDER — SODIUM CHLORIDE 0.9 % IV SOLN
Freq: Once | INTRAVENOUS | Status: AC
  Administered 2016-07-23: 10:00:00 via INTRAVENOUS
  Filled 2016-07-23: qty 1000

## 2016-07-23 MED ORDER — GEMCITABINE HCL CHEMO INJECTION 1 GM/26.3ML
1100.0000 mg/m2 | Freq: Once | INTRAVENOUS | Status: AC
  Administered 2016-07-23: 1710 mg via INTRAVENOUS
  Filled 2016-07-23: qty 26.3

## 2016-07-27 ENCOUNTER — Telehealth: Payer: Self-pay | Admitting: Cardiology

## 2016-07-27 NOTE — Telephone Encounter (Signed)
Spoke with patient and reviewed instructions for stress test scheduled for tomorrow. She let me know that she is a hard stick and has some pain issues. Instructed her to let them know when she arrives as they have already gone the day. She verbalized understanding of all instructions, time, location, and had no further questions at this time.

## 2016-07-27 NOTE — Telephone Encounter (Signed)
Line was busy. Attempted to call and verify stress test instructions and appointment for tomorrow.

## 2016-07-28 ENCOUNTER — Encounter
Admission: RE | Admit: 2016-07-28 | Discharge: 2016-07-28 | Disposition: A | Discharge status: Home / Self Care | Payer: Medicaid Other | Source: Ambulatory Visit | Attending: Cardiovascular Disease | Admitting: Cardiovascular Disease

## 2016-07-28 DIAGNOSIS — R931 Abnormal findings on diagnostic imaging of heart and coronary circulation: Secondary | ICD-10-CM | POA: Insufficient documentation

## 2016-07-28 DIAGNOSIS — I2510 Atherosclerotic heart disease of native coronary artery without angina pectoris: Secondary | ICD-10-CM

## 2016-07-28 DIAGNOSIS — R079 Chest pain, unspecified: Secondary | ICD-10-CM | POA: Insufficient documentation

## 2016-07-28 MED ORDER — REGADENOSON 0.4 MG/5ML IV SOLN
0.4000 mg | Freq: Once | INTRAVENOUS | Status: AC
Start: 2016-07-28 — End: 2016-07-28
  Administered 2016-07-28: 0.4 mg via INTRAVENOUS

## 2016-07-28 MED ORDER — TECHNETIUM TC 99M TETROFOSMIN IV KIT
32.3800 | PACK | Freq: Once | INTRAVENOUS | Status: AC | PRN
  Administered 2016-07-28: 32.38 via INTRAVENOUS

## 2016-07-28 MED ORDER — TECHNETIUM TC 99M TETROFOSMIN IV KIT
12.9600 | PACK | Freq: Once | INTRAVENOUS | Status: AC | PRN
  Administered 2016-07-28: 12.96 via INTRAVENOUS

## 2016-07-29 LAB — NM MYOCAR MULTI W/SPECT W/WALL MOTION / EF
CHL CUP NUCLEAR SDS: 0
CHL CUP NUCLEAR SRS: 2
CSEPPHR: 137 {beats}/min
LVDIAVOL: 44 mL (ref 46–106)
LVSYSVOL: 21 mL
NUC STRESS TID: 1.45
Percent HR: 79 %
Rest HR: 99 {beats}/min
SSS: 0

## 2016-07-30 ENCOUNTER — Other Ambulatory Visit: Payer: Medicaid Other

## 2016-07-30 ENCOUNTER — Ambulatory Visit: Payer: Medicaid Other | Admitting: Oncology

## 2016-08-03 ENCOUNTER — Other Ambulatory Visit: Payer: Self-pay

## 2016-08-03 ENCOUNTER — Ambulatory Visit (INDEPENDENT_AMBULATORY_CARE_PROVIDER_SITE_OTHER): Payer: Medicaid Other

## 2016-08-03 ENCOUNTER — Telehealth: Payer: Self-pay | Admitting: Cardiology

## 2016-08-03 DIAGNOSIS — R079 Chest pain, unspecified: Secondary | ICD-10-CM

## 2016-08-03 DIAGNOSIS — I2510 Atherosclerotic heart disease of native coronary artery without angina pectoris: Secondary | ICD-10-CM | POA: Diagnosis not present

## 2016-08-03 NOTE — Telephone Encounter (Signed)
Patient here having echocardiogram done and ultrasound technician alerted [place] to her having black hands. See result notes from 08/02/16 for stress test. Dr. Yvone Neu went into echo room and assessed patients hands and left heel. Dr. Yvone Neu instructed patient to contact oncology and/or PCP with these findings. Patient verbalized understanding and had no further questions at this time.

## 2016-08-03 NOTE — Progress Notes (Deleted)
Cardiology Office Note   Date:  08/03/2016   ID:  [name], [name] 10-Oct-1971, MRN [id number]  Referring Doctor:  [facility]   Cardiologist:   Wende Bushy, MD   Reason for consultation:  No chief complaint on file.     History of Present Illness: [name] is a 45 y.o. female who presents for establishing care for CAD.  Pt has hx of CAD s/p anterolateral MI s/p PCI to LAD 10/2012. She would like to transfer care due to proximity of office location.   Pt reports that she is having CP - mild, central chest, tightness, non radiating, randomly occurring, lasts a few minutes at a time. Also has SOB. Ongoing for a few weeks. History not very clear  Ongoing treatment for breast cancer. Denies lightheadedness, syncope, palpitations.   ROS:  Please see the history of present illness. Aside from mentioned under HPI, all other systems are reviewed and negative.     Past Medical History:  Diagnosis Date  . Asthma 2005  . Breast cancer ([place])   . CAD (coronary artery disease)   . Cancer [facility]) 2012   breast cancer, right-treated with neoadjunvant chemotherapy followed by partial mastectomy with sn bx and radiation. Pt is currently taking Tamoxifen since January 2013   . Hypertension 2012  . Lump or mass in breast 2012   right  . Malignant neoplasm of upper-outer quadrant of female breast ([place]) 2012   right  . MI (myocardial infarction) ([place])   . Myocardial infarction ([place]) 10/2012  . Personal history of tobacco use, presenting hazards to health     Past Surgical History:  Procedure Laterality Date  . BREAST LUMPECTOMY Right 2012  . CESAREAN SECTION  [id number]  . CORONARY ANGIOPLASTY WITH STENT PLACEMENT  20123   Myocardial infarction  . FINGER SURGERY Left 1986   reconstruction, left pinky  . LEFT HEART CATHETERIZATION WITH CORONARY ANGIOGRAM N/A 10/16/2012   Procedure: LEFT HEART CATHETERIZATION WITH CORONARY ANGIOGRAM;  Surgeon: Clent Demark, MD;  Location: [place] CATH LAB;  Service: Cardiovascular;  Laterality: N/A;  . PORTACATH PLACEMENT    . TUBAL LIGATION  2004  . WRIST SURGERY Left 1987     reports that she has been smoking Cigarettes.  She has a 7.50 pack-year smoking history. She has never used smokeless tobacco. She reports that she does not drink alcohol or use drugs.   family history includes Cancer (age of onset: 75) in her maternal grandmother.   Outpatient Medications Prior to Visit  Medication Sig Dispense Refill  . albuterol (PROVENTIL HFA;VENTOLIN HFA) 108 (90 Base) MCG/ACT inhaler Inhale 2 puffs into the lungs every 6 (six) hours as needed for wheezing or shortness of breath. 1 Inhaler 0  . aspirin EC 81 MG EC tablet Take 1 tablet (81 mg total) by mouth daily. 30 tablet 3  . nitroGLYCERIN (NITROSTAT) 0.4 MG SL tablet Place 1 tablet (0.4 mg total) under the tongue every 5 (five) minutes x 3 doses as needed for chest pain. 30 tablet 0  . omeprazole (PRILOSEC) 20 MG capsule Take 1 capsule (20 mg total) by mouth daily. 30 capsule 3  . oxyCODONE-acetaminophen (PERCOCET/ROXICET) 5-325 MG tablet Take 1 tablet by mouth at bedtime as needed for severe pain. 30 tablet 0  . potassium chloride SA (K-DUR,KLOR-CON) 20 MEQ tablet Take 1 tablet (20 mEq total) by mouth 2 (two) times daily. 60 tablet 3  . prochlorperazine (COMPAZINE) 10 MG  tablet Take 1 tablet (10 mg total) by mouth every 6 (six) hours as needed for nausea or vomiting. 30 tablet 0   Facility-Administered Medications Prior to Visit  Medication Dose Route Frequency Provider Last Rate Last Dose  . palonosetron (ALOXI) injection 0.25 mg  0.25 mg Intravenous Once Lloyd Huger, MD         Allergies: Codeine and Penicillins    PHYSICAL EXAM: VS:  There were no vitals taken for this visit. , There is no height or weight on file to calculate BMI. Wt Readings from Last 3 Encounters:  07/14/16 114 lb (51.7 kg)  06/25/16 119 lb 13.1 oz (54.3 kg)  05/21/16  121 lb 14.6 oz (55.3 kg)    GENERAL:  well developed, well nourished, not in acute distress HEENT: normocephalic, pink conjunctivae, anicteric sclerae, no xanthelasma, normal dentition, oropharynx clear NECK:  no neck vein engorgement, JVP normal, no hepatojugular reflux, carotid upstroke brisk and symmetric, no bruit, no thyromegaly, no lymphadenopathy LUNGS:  good respiratory effort, clear to auscultation bilaterally CV:  PMI not displaced, no thrills, no lifts, S1 and S2 within normal limits, no palpable S3 or S4, no murmurs, no rubs, no gallops ABD:  Soft, nontender, nondistended, normoactive bowel sounds, no abdominal aortic bruit, no hepatomegaly, no splenomegaly MS: nontender back, no kyphosis, no scoliosis, no joint deformities EXT:  2+ DP/PT pulses, no edema, no varicosities, no cyanosis, no clubbing SKIN: warm, nondiaphoretic, normal turgor, no ulcers NEUROPSYCH: alert, oriented to person, place, and time, sensory/motor grossly intact, normal mood, appropriate affect  Recent Labs: 10/20/2015: Magnesium 1.9 07/23/2016: ALT 17; BUN 7; Creatinine, Ser 0.48; Hemoglobin 11.0; Platelets 494; Potassium 3.5; Sodium 134   Lipid Panel    Component Value Date/Time   CHOL 146 10/17/2012 0520   TRIG 112 10/17/2012 0520   HDL 25 (L) 10/17/2012 0520   CHOLHDL 5.8 10/17/2012 0520   VLDL 22 10/17/2012 0520   LDLCALC 99 10/17/2012 0520     Other studies Reviewed:  EKG:  The ekg from 07/14/2016 was personally reviewed by me and it revealed sinus rhythm, 90 BPM, ST and T-wave abnormality, T-wave inversions in the anterolateral leads.  Additional studies/ records that were reviewed personally reviewed by me today include:  Nuclear stress is 07/28/2016: Pharmacological myocardial perfusion imaging study with no significant  ischemia  Small region of fixed defect of mild severity in the anteroseptal wall consistent with breast attenuation artifact. Attenuation correction not performed on this  study Normal wall motion, EF estimated at 69% No EKG changes concerning for ischemia at peak stress or in recovery. Low risk scan   ASSESSMENT AND PLAN: CAD s/p Anterolateral wall MI, s/p PCI to LAD 10/2012 CP SOB Rec further eval with pharmacologic stress test. Check echo. In meantime cont meds. Not on BB or ACE-I -- per pt, could not tolerate due to low BP. Not on statin -- awaiting records from prev cardiologist.  Tobacco use We discussed the importance of smoking cessation and different strategies for quitting.    Current medicines are reviewed at length with the patient today.  The patient does not have concerns regarding medicines.  Labs/ tests ordered today include:  No orders of the defined types were placed in this encounter.   I had a lengthy and detailed discussion with the patient regarding diagnoses, prognosis, diagnostic options, treatment options , and side effects of medications.   I counseled the patient on importance of lifestyle modification including heart healthy diet, regular physical activity, and  smoking cessation.   Disposition:   FU with undersigned after tests    Signed, Wende Bushy, MD  08/03/2016 12:52 PM    [place]  This note was generated in part with voice recognition software and I apologize for any typographical errors that were not detected and corrected.

## 2016-08-04 LAB — ECHOCARDIOGRAM COMPLETE
AVLVOTPG: 5 mmHg
CHL CUP DOP CALC LVOT VTI: 14.2 cm
LA ID, A-P, ES: 19 mm
LA diam index: 1.26 cm/m2
LA vol: 27 mL
LAVOLA4C: 23 mL
LAVOLIN: 17.9 mL/m2
LEFT ATRIUM END SYS DIAM: 19 mm
LV TDI E'LATERAL: 10
LV TDI E'MEDIAL: 9.36
LV e' LATERAL: 10 cm/s
LVOT area: 3.14 cm2
LVOTD: 20 mm
LVOTPV: 112 cm/s
LVOTSV: 45 mL
TAPSE: 12.2 mm

## 2016-08-04 NOTE — Progress Notes (Signed)
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 ER/PR positive, HER-2/neu not overexpressing.  INTERVAL HISTORY: Patient returns to clinic today for further evaluation and consideration of cycle 3, day 1 of Gemcitabine and Taxotere.  Gemcitabine only today. She is complaining of discoloration of her fingertips and bottom of her feet as well as blistering of her skin on her extremities. She otherwise feels well. She does not complain of pain today. She continues to be highly anxious. She has increased nausea and vomiting for several days after treatment, but this has since resolved. She has no neurologic complaints. She denies any fevers. She denies chest pain or shortness of breath. She denies any constipation or diarrhea. She has no urinary complaints. Patient offers no further specific complaints.  REVIEW OF SYSTEMS:   Review of Systems  Constitutional: Positive for malaise/fatigue. Negative for fever and weight loss.  Eyes: Negative.   Respiratory: Negative.  Negative for cough and shortness of breath.   Cardiovascular: Negative.  Negative for chest pain and leg swelling.  Gastrointestinal: Positive for nausea and vomiting. Negative for abdominal pain.  Genitourinary: Negative.   Musculoskeletal: Positive for joint pain.  Neurological: Positive for weakness.  Psychiatric/Behavioral: The patient is nervous/anxious.     As per HPI. Otherwise, a complete review of systems is negative.  PAST MEDICAL HISTORY: Past Medical History:  Diagnosis Date  . Asthma 2005  . Breast cancer ([facility])   . CAD (coronary artery disease)   . Cancer ([facility]) 2012   breast cancer, right-treated with neoadjunvant chemotherapy  followed by partial mastectomy with sn bx and radiation. Pt is currently taking Tamoxifen since January 2013   . Hypertension 2012  . Lump or mass in breast 2012   right  . Malignant neoplasm of upper-outer quadrant of female breast ([facility]) 2012   right  . MI (myocardial infarction) ([facility])   . Myocardial infarction ([facility]) 10/2012  . Personal history of tobacco use, presenting hazards to health     PAST SURGICAL HISTORY: Past Surgical History:  Procedure Laterality Date  . BREAST LUMPECTOMY Right 2012  . CESAREAN SECTION  1990,1996  . CORONARY ANGIOPLASTY WITH STENT PLACEMENT  20123   Myocardial infarction  . FINGER SURGERY Left 1986   reconstruction, left pinky  . LEFT HEART CATHETERIZATION WITH CORONARY ANGIOGRAM N/A 10/16/2012   Procedure: LEFT HEART CATHETERIZATION WITH CORONARY ANGIOGRAM;  Surgeon: Mohan N Harwani, MD;  Location: [facility] CATH LAB;  Service: Cardiovascular;  Laterality: N/A;  . PORTACATH PLACEMENT    . TUBAL LIGATION  2004  . WRIST SURGERY Left 1987    FAMILY HISTORY Family History  Problem Relation Age of Onset  . Cancer Maternal Grandmother 69    breast       ADVANCED DIRECTIVES:    HEALTH MAINTENANCE: Social History  Substance Use Topics  . Smoking status: Current Every Day Smoker    Packs/day: 0.50    Years: 15.00    Types: Cigarettes  . Smokeless tobacco: Never Used  . Alcohol use No     Allergies  Allergen Reactions  . Codeine Nausea And Vomiting  . Penicillins Rash    Current Outpatient Prescriptions  Medication Sig Dispense Refill  . albuterol (PROVENTIL HFA;VENTOLIN HFA) 108 (90 Base) MCG/ACT inhaler   Inhale 2 puffs into the lungs every 6 (six) hours as needed for wheezing or shortness of breath. 1 Inhaler 0  . aspirin EC 81 MG EC tablet Take 1 tablet (81 mg total) by mouth daily. 30 tablet 3  . nitroGLYCERIN (NITROSTAT) 0.4 MG SL tablet Place 1 tablet (0.4 mg total) under the tongue every 5 (five) minutes x 3 doses as needed for chest  pain. 30 tablet 0  . omeprazole (PRILOSEC) 20 MG capsule Take 1 capsule (20 mg total) by mouth daily. 30 capsule 3  . oxyCODONE-acetaminophen (PERCOCET/ROXICET) 5-325 MG tablet Take 1 tablet by mouth at bedtime as needed for severe pain. 30 tablet 0  . potassium chloride SA (K-DUR,KLOR-CON) 20 MEQ tablet Take 1 tablet (20 mEq total) by mouth 2 (two) times daily. 60 tablet 3  . prochlorperazine (COMPAZINE) 10 MG tablet Take 1 tablet (10 mg total) by mouth every 6 (six) hours as needed for nausea or vomiting. 30 tablet 0   Current Facility-Administered Medications  Medication Dose Route Frequency Provider Last Rate Last Dose  . palonosetron (ALOXI) injection 0.25 mg  0.25 mg Intravenous Once  J , MD        OBJECTIVE: There were no vitals filed for this visit.   There is no height or weight on file to calculate BMI.    ECOG FS:1 - Symptomatic but completely ambulatory  General: Well-developed, well-nourished, no acute distress. Eyes: anicteric sclera. Breasts: Exam deferred today. Lungs: Clear to auscultation bilaterally. Heart: Regular rate and rhythm. No rubs, murmurs, or gallops. Abdomen: Soft, tender to palpation, nondistended.  Musculoskeletal: Minimal right shoulder mobility secondary to pain. Discoloration and blistering of hands. Neuro: Alert, answering all questions appropriately.  Skin: No rashes or petechiae noted. Psych: Normal affect.   LAB RESULTS:  Lab Results  Component Value Date   NA 134 (L) 07/23/2016   K 3.5 07/23/2016   CL 104 07/23/2016   CO2 26 07/23/2016   GLUCOSE 92 07/23/2016   BUN 7 07/23/2016   CREATININE 0.48 07/23/2016   CALCIUM 8.1 (L) 07/23/2016   PROT 6.4 (L) 07/23/2016   ALBUMIN 3.4 (L) 07/23/2016   AST 19 07/23/2016   ALT 17 07/23/2016   ALKPHOS 55 07/23/2016   BILITOT 0.3 07/23/2016   GFRNONAA >60 07/23/2016   GFRAA >60 07/23/2016    Lab Results  Component Value Date   WBC 6.6 07/23/2016   NEUTROABS 3.9 07/23/2016    HGB 11.0 (L) 07/23/2016   HCT 31.2 (L) 07/23/2016   MCV 91.5 07/23/2016   PLT 494 (H) 07/23/2016   Lab Results  Component Value Date   LABCA2 65.1 (H) 06/25/2016    STUDIES: Nm Myocar Multi W/spect W/wall Motion / Ef  Addendum Date: 08/02/2016   Pharmacological myocardial perfusion imaging study with no significant  ischemia Small region of fixed defect of mild severity in the anteroseptal wall consistent with breast attenuation artifact. Attenuation correction not performed on this study Normal wall motion, EF estimated at 69% No EKG changes concerning for ischemia at peak stress or in recovery. Low risk scan Signed, Tim Gollan, MD, Ph.D [facility]   Addendum Date: 08/02/2016    T wave inversion was noted during stress in the V2, V3, V4, V5 and V6 leads.  There was no ST segment deviation noted during stress.    Result Date: 07/29/2016  T wave inversion was noted during stress in the V2, V3, V4, V5 and V6 leads.  There was no ST segment deviation noted   during stress.     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 ER/PR positive, HER-2/neu not overexpressing.  PLAN:    1. 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 ER/PR positive, HER-2/neu not overexpressing: CT and bone scan scan results reviewed independently with progression of disease. Patient's CA-27-29 result is pending.  Proceed with cycle 3, day 1 today. She will receive gemcitabine on days 1 and 8 and Taxotere on day 8. Patient has requested to hold On-Pro neulasta support. Because of patient's and blistering and discoloration, will discontinue Taxotere. Plan to repeat imaging at the conclusion of cycle 3. Discontinue letrozole at this time. Return to clinic in 1 week for consideration of cycle 3, day 8, which will be gemcitabine only as well. 2. Right shoulder pain: Continue evaluation and treatment per  primary care.   3. Leukopenia: Resolved. Secondary to chemotherapy.  Patient has requested to hold Neulasta at the time. 4. Thrombocytosis: Likely reactive, monitor. 5. Skin blistering/discoloration: Unusual, possibly secondary to Taxotere. Will discontinue as above.  Patient expressed understanding and was in agreement with this plan. She also understands that She can call clinic at any time with any questions, concerns, or complaints.     J , MD   08/04/2016 11:40 PM       

## 2016-08-06 ENCOUNTER — Inpatient Hospital Stay (HOSPITAL_BASED_OUTPATIENT_CLINIC_OR_DEPARTMENT_OTHER): Payer: Medicaid Other | Admitting: Oncology

## 2016-08-06 ENCOUNTER — Inpatient Hospital Stay: Payer: Medicaid Other

## 2016-08-06 VITALS — BP 112/76 | HR 88

## 2016-08-06 VITALS — BP 118/85 | HR 97 | Temp 98.4°F | Wt 114.7 lb

## 2016-08-06 DIAGNOSIS — C50912 Malignant neoplasm of unspecified site of left female breast: Secondary | ICD-10-CM

## 2016-08-06 DIAGNOSIS — Z79899 Other long term (current) drug therapy: Secondary | ICD-10-CM

## 2016-08-06 DIAGNOSIS — C50412 Malignant neoplasm of upper-outer quadrant of left female breast: Secondary | ICD-10-CM

## 2016-08-06 DIAGNOSIS — C7960 Secondary malignant neoplasm of unspecified ovary: Secondary | ICD-10-CM | POA: Diagnosis not present

## 2016-08-06 DIAGNOSIS — Z9011 Acquired absence of right breast and nipple: Secondary | ICD-10-CM

## 2016-08-06 DIAGNOSIS — Z5111 Encounter for antineoplastic chemotherapy: Secondary | ICD-10-CM | POA: Diagnosis not present

## 2016-08-06 DIAGNOSIS — M25511 Pain in right shoulder: Secondary | ICD-10-CM

## 2016-08-06 DIAGNOSIS — Z923 Personal history of irradiation: Secondary | ICD-10-CM

## 2016-08-06 DIAGNOSIS — C7989 Secondary malignant neoplasm of other specified sites: Secondary | ICD-10-CM | POA: Diagnosis not present

## 2016-08-06 DIAGNOSIS — Z79811 Long term (current) use of aromatase inhibitors: Secondary | ICD-10-CM

## 2016-08-06 DIAGNOSIS — Z170 Estrogen receptor positive status [ER+]: Secondary | ICD-10-CM | POA: Diagnosis not present

## 2016-08-06 DIAGNOSIS — D696 Thrombocytopenia, unspecified: Secondary | ICD-10-CM

## 2016-08-06 LAB — CBC WITH DIFFERENTIAL/PLATELET
BASOS ABS: 0 10*3/uL (ref 0–0.1)
BASOS PCT: 1 %
EOS ABS: 0 10*3/uL (ref 0–0.7)
Eosinophils Relative: 1 %
HCT: 31.7 % — ABNORMAL LOW (ref 35.0–47.0)
HEMOGLOBIN: 11 g/dL — AB (ref 12.0–16.0)
LYMPHS ABS: 1.4 10*3/uL (ref 1.0–3.6)
Lymphocytes Relative: 29 %
MCH: 32.7 pg (ref 26.0–34.0)
MCHC: 34.6 g/dL (ref 32.0–36.0)
MCV: 94.6 fL (ref 80.0–100.0)
Monocytes Absolute: 1.1 10*3/uL — ABNORMAL HIGH (ref 0.2–0.9)
Monocytes Relative: 24 %
NEUTROS PCT: 45 %
Neutro Abs: 2.1 10*3/uL (ref 1.4–6.5)
PLATELETS: 447 10*3/uL — AB (ref 150–440)
RBC: 3.35 MIL/uL — AB (ref 3.80–5.20)
RDW: 16.4 % — ABNORMAL HIGH (ref 11.5–14.5)
WBC: 4.7 10*3/uL (ref 3.6–11.0)

## 2016-08-06 LAB — COMPREHENSIVE METABOLIC PANEL
ALBUMIN: 3.2 g/dL — AB (ref 3.5–5.0)
ALK PHOS: 70 U/L (ref 38–126)
ALT: 17 U/L (ref 14–54)
AST: 18 U/L (ref 15–41)
Anion gap: 9 (ref 5–15)
BUN: 6 mg/dL (ref 6–20)
CALCIUM: 8 mg/dL — AB (ref 8.9–10.3)
CHLORIDE: 101 mmol/L (ref 101–111)
CO2: 24 mmol/L (ref 22–32)
CREATININE: 0.55 mg/dL (ref 0.44–1.00)
GFR calc Af Amer: >60 mL/min (ref >60)
GFR calc non Af Amer: >60 mL/min (ref >60)
GLUCOSE: 92 mg/dL (ref 65–99)
Potassium: 3.7 mmol/L (ref 3.5–5.1)
SODIUM: 134 mmol/L — AB (ref 135–145)
Total Bilirubin: 0.5 mg/dL (ref 0.3–1.2)
Total Protein: 6.2 g/dL — ABNORMAL LOW (ref 6.5–8.1)

## 2016-08-06 MED ORDER — SODIUM CHLORIDE 0.9% FLUSH
10.0000 mL | INTRAVENOUS | Status: DC | PRN
  Administered 2016-08-06: 10 mL
  Filled 2016-08-06: qty 10

## 2016-08-06 MED ORDER — HEPARIN SOD (PORK) LOCK FLUSH 100 UNIT/ML IV SOLN
500.0000 [IU] | Freq: Once | INTRAVENOUS | Status: AC | PRN
  Administered 2016-08-06: 500 [IU]
  Filled 2016-08-06: qty 5

## 2016-08-06 MED ORDER — SODIUM CHLORIDE 0.9 % IV SOLN
Freq: Once | INTRAVENOUS | Status: AC
  Administered 2016-08-06: 11:00:00 via INTRAVENOUS
  Filled 2016-08-06: qty 1000

## 2016-08-06 MED ORDER — GEMCITABINE HCL CHEMO INJECTION 1 GM/26.3ML
1100.0000 mg/m2 | Freq: Once | INTRAVENOUS | Status: AC
  Administered 2016-08-06: 1710 mg via INTRAVENOUS
  Filled 2016-08-06: qty 23.93

## 2016-08-06 MED ORDER — PROCHLORPERAZINE MALEATE 10 MG PO TABS
10.0000 mg | ORAL_TABLET | Freq: Once | ORAL | Status: AC
  Administered 2016-08-06: 10 mg via ORAL
  Filled 2016-08-06: qty 1

## 2016-08-06 NOTE — Progress Notes (Signed)
Patient ambulates without assistance, brought to exam room 2, accompanied by her daughter.  Patient c/o bilateral hand swelling, bruising and blisters.  Right foot bruising and blisters on right  heel and bruising on right upper cheek.  Patient also state her hands have a tingling sensation when she coughs. Vitals documented

## 2016-08-07 LAB — CANCER ANTIGEN 27.29: CA 27.29: 76.5 U/mL — ABNORMAL HIGH (ref 0.0–38.6)

## 2016-08-10 ENCOUNTER — Ambulatory Visit: Payer: Medicaid Other | Admitting: Cardiology

## 2016-08-11 NOTE — Progress Notes (Signed)
[place]  [phone] [phone]  ID: [name] OB: 1971/06/25  MR#: [id number]  PQZ#:[id number]  Patient Care Team: [facility] as PCP - General  CHIEF COMPLAINT: Stage IV left upper outer quadrant lobular breast cancer with ovarian and abdominal metastasis. ER/PR positive, HER-2/neu not overexpressing.  INTERVAL HISTORY: Patient returns to clinic today for further evaluation and consideration of cycle 3, day 8 of Gemcitabine and Taxotere. Gemcitabine only today. Blistering on her hands and feet has improved. She otherwise feels well. She does not complain of pain today. She continues to be highly anxious. She continues to have increased nausea several days after her treatments, but this has since resolved. She has no neurologic complaints. She denies any fevers. She denies chest pain or shortness of breath. She denies any constipation or diarrhea. She has no urinary complaints. Patient offers no further specific complaints.  REVIEW OF SYSTEMS:   Review of Systems  Constitutional: Positive for malaise/fatigue. Negative for fever and weight loss.  Eyes: Negative.   Respiratory: Negative.  Negative for cough and shortness of breath.   Cardiovascular: Negative.  Negative for chest pain and leg swelling.  Gastrointestinal: Positive for nausea and vomiting. Negative for abdominal pain.  Genitourinary: Negative.   Musculoskeletal: Positive for joint pain.  Neurological: Positive for weakness.  Psychiatric/Behavioral: The patient is nervous/anxious.     As per HPI. Otherwise, a complete review of systems is negative.  PAST MEDICAL HISTORY: Past Medical History:  Diagnosis Date  . Asthma 2005  . Breast cancer ([place])   . CAD (coronary artery disease)   . Cancer [facility]) 2012   breast cancer, right-treated with neoadjunvant chemotherapy followed by partial mastectomy with sn bx and radiation. Pt is currently taking  Tamoxifen since January 2013   . Hypertension 2012  . Lump or mass in breast 2012   right  . Malignant neoplasm of upper-outer quadrant of female breast ([place]) 2012   right  . MI (myocardial infarction)   . Myocardial infarction 10/2012  . Personal history of tobacco use, presenting hazards to health     PAST SURGICAL HISTORY: Past Surgical History:  Procedure Laterality Date  . BREAST LUMPECTOMY Right 2012  . CESAREAN SECTION  [id number]  . CORONARY ANGIOPLASTY WITH STENT PLACEMENT  20123   Myocardial infarction  . FINGER SURGERY Left 1986   reconstruction, left pinky  . LEFT HEART CATHETERIZATION WITH CORONARY ANGIOGRAM N/A 10/16/2012   Procedure: LEFT HEART CATHETERIZATION WITH CORONARY ANGIOGRAM;  Surgeon: Clent Demark, MD;  Location: [place] CATH LAB;  Service: Cardiovascular;  Laterality: N/A;  . PORTACATH PLACEMENT    . TUBAL LIGATION  2004  . WRIST SURGERY Left 1987    FAMILY HISTORY Family History  Problem Relation Age of Onset  . Cancer Maternal Grandmother 69    breast       ADVANCED DIRECTIVES:    HEALTH MAINTENANCE: Social History  Substance Use Topics  . Smoking status: Current Every Day Smoker    Packs/day: 0.50    Years: 15.00    Types: Cigarettes  . Smokeless tobacco: Never Used  . Alcohol use No     Allergies  Allergen Reactions  . Codeine Nausea And Vomiting  . Penicillins Rash    Current Outpatient Prescriptions  Medication Sig Dispense Refill  . albuterol (PROVENTIL HFA;VENTOLIN HFA) 108 (90 Base) MCG/ACT inhaler Inhale 2 puffs into the lungs every 6 (six) hours as needed for wheezing or shortness of breath.  1 Inhaler 0  . aspirin EC 81 MG EC tablet Take 1 tablet (81 mg total) by mouth daily. 30 tablet 3  . nitroGLYCERIN (NITROSTAT) 0.4 MG SL tablet Place 1 tablet (0.4 mg total) under the tongue every 5 (five) minutes x 3 doses as needed for chest pain. 30 tablet 0  . omeprazole (PRILOSEC) 20 MG capsule Take 1 capsule (20 mg total) by mouth  daily. 30 capsule 3  . oxyCODONE-acetaminophen (PERCOCET/ROXICET) 5-325 MG tablet Take 1 tablet by mouth at bedtime as needed for severe pain. 30 tablet 0  . potassium chloride SA (K-DUR,KLOR-CON) 20 MEQ tablet Take 1 tablet (20 mEq total) by mouth 2 (two) times daily. 60 tablet 3  . prochlorperazine (COMPAZINE) 10 MG tablet Take 1 tablet (10 mg total) by mouth every 6 (six) hours as needed for nausea or vomiting. 30 tablet 0   Current Facility-Administered Medications  Medication Dose Route Frequency Provider Last Rate Last Dose  . palonosetron (ALOXI) injection 0.25 mg  0.25 mg Intravenous Once Lloyd Huger, MD        OBJECTIVE: Vitals:   08/13/16 1016  BP: 130/89  Pulse: 87  Resp: 18  Temp: 98.9 F (37.2 C)     Body mass index is 21.81 kg/m.    ECOG FS:1 - Symptomatic but completely ambulatory  General: Well-developed, well-nourished, no acute distress. Eyes: anicteric sclera. Breasts: Exam deferred today. Lungs: Clear to auscultation bilaterally. Heart: Regular rate and rhythm. No rubs, murmurs, or gallops. Abdomen: Soft, tender to palpation, nondistended.  Musculoskeletal: Minimal right shoulder mobility secondary to pain. Discoloration and blistering of hands. Neuro: Alert, answering all questions appropriately.  Skin: No rashes or petechiae noted. Psych: Normal affect.   LAB RESULTS:  Lab Results  Component Value Date   NA 136 08/13/2016   K 3.4 (L) 08/13/2016   CL 102 08/13/2016   CO2 27 08/13/2016   GLUCOSE 88 08/13/2016   BUN 7 08/13/2016   CREATININE 0.48 08/13/2016   CALCIUM 7.9 (L) 08/13/2016   PROT 6.3 (L) 08/13/2016   ALBUMIN 3.2 (L) 08/13/2016   AST 21 08/13/2016   ALT 18 08/13/2016   ALKPHOS 66 08/13/2016   BILITOT 0.2 (L) 08/13/2016   GFRNONAA >60 08/13/2016   GFRAA >60 08/13/2016    Lab Results  Component Value Date   WBC 5.9 08/13/2016   NEUTROABS 3.7 08/13/2016   HGB 10.6 (L) 08/13/2016   HCT 31.3 (L) 08/13/2016   MCV 94.8  08/13/2016   PLT 375 08/13/2016   Lab Results  Component Value Date   LABCA2 76.5 (H) 08/06/2016    STUDIES: Nm Myocar Multi W/spect W/wall Motion / Ef  Addendum Date: 08/02/2016   Pharmacological myocardial perfusion imaging study with no significant  ischemia Small region of fixed defect of mild severity in the anteroseptal wall consistent with breast attenuation artifact. Attenuation correction not performed on this study Normal wall motion, EF estimated at 69% No EKG changes concerning for ischemia at peak stress or in recovery. Low risk scan Signed, Esmond Plants, MD, Ph.D [facility]   Addendum Date: 08/02/2016    T wave inversion was noted during stress in the V2, V3, V4, V5 and V6 leads.  There was no ST segment deviation noted during stress.    Result Date: 07/29/2016  T wave inversion was noted during stress in the V2, V3, V4, V5 and V6 leads.  There was no ST segment deviation noted during stress.     ASSESSMENT: Stage IV  left upper outer quadrant lobular breast cancer with ovarian and abdominal metastasis. ER/PR positive, HER-2/neu not overexpressing.  PLAN:    1. 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 ER/PR positive, HER-2/neu not overexpressing:  Patient's CA-27-29 is trending up.  Proceed with cycle 3, day 8 today. Taxotere has been discontinued given her significant skin blistering. Proceed with gemcitabine only today. Patient has requested to hold On-Pro neulasta support. Given patient's increasing CA 2729, will repeat imaging with CT scan and nuclear med bone scan in 3 weeks. Return to clinic 1-2 days after to discuss the results and additional treatment planning if necessary. 2. Right shoulder pain: Continue evaluation and treatment per primary care.   3. Leukopenia: Resolved. Secondary to chemotherapy.  Patient has requested to hold Neulasta at the time. 4. Thrombocytosis: Resolved. 5. Skin  blistering/discoloration: Unusual, possibly secondary to Taxotere. Will discontinue as above.  Patient expressed understanding and was in agreement with this plan. She also understands that She can call clinic at any time with any questions, concerns, or complaints.    Lloyd Huger, MD   08/15/2016 9:41 AM

## 2016-08-13 ENCOUNTER — Inpatient Hospital Stay (HOSPITAL_BASED_OUTPATIENT_CLINIC_OR_DEPARTMENT_OTHER): Payer: Medicaid Other | Admitting: Oncology

## 2016-08-13 ENCOUNTER — Inpatient Hospital Stay: Payer: Medicaid Other | Attending: Oncology

## 2016-08-13 ENCOUNTER — Inpatient Hospital Stay: Payer: Medicaid Other

## 2016-08-13 VITALS — BP 130/89 | HR 87 | Temp 98.9°F | Resp 18 | Wt 119.3 lb

## 2016-08-13 DIAGNOSIS — I10 Essential (primary) hypertension: Secondary | ICD-10-CM | POA: Insufficient documentation

## 2016-08-13 DIAGNOSIS — C50412 Malignant neoplasm of upper-outer quadrant of left female breast: Secondary | ICD-10-CM

## 2016-08-13 DIAGNOSIS — I2510 Atherosclerotic heart disease of native coronary artery without angina pectoris: Secondary | ICD-10-CM | POA: Diagnosis not present

## 2016-08-13 DIAGNOSIS — I252 Old myocardial infarction: Secondary | ICD-10-CM | POA: Diagnosis not present

## 2016-08-13 DIAGNOSIS — Z955 Presence of coronary angioplasty implant and graft: Secondary | ICD-10-CM | POA: Diagnosis not present

## 2016-08-13 DIAGNOSIS — M25511 Pain in right shoulder: Secondary | ICD-10-CM | POA: Diagnosis not present

## 2016-08-13 DIAGNOSIS — J45909 Unspecified asthma, uncomplicated: Secondary | ICD-10-CM | POA: Diagnosis not present

## 2016-08-13 DIAGNOSIS — Z5111 Encounter for antineoplastic chemotherapy: Secondary | ICD-10-CM | POA: Diagnosis not present

## 2016-08-13 DIAGNOSIS — C7960 Secondary malignant neoplasm of unspecified ovary: Secondary | ICD-10-CM | POA: Diagnosis not present

## 2016-08-13 DIAGNOSIS — Z170 Estrogen receptor positive status [ER+]: Secondary | ICD-10-CM

## 2016-08-13 DIAGNOSIS — F17210 Nicotine dependence, cigarettes, uncomplicated: Secondary | ICD-10-CM

## 2016-08-13 DIAGNOSIS — R188 Other ascites: Secondary | ICD-10-CM | POA: Insufficient documentation

## 2016-08-13 DIAGNOSIS — Z7982 Long term (current) use of aspirin: Secondary | ICD-10-CM | POA: Insufficient documentation

## 2016-08-13 DIAGNOSIS — C50912 Malignant neoplasm of unspecified site of left female breast: Secondary | ICD-10-CM

## 2016-08-13 DIAGNOSIS — C786 Secondary malignant neoplasm of retroperitoneum and peritoneum: Secondary | ICD-10-CM | POA: Insufficient documentation

## 2016-08-13 DIAGNOSIS — Z79899 Other long term (current) drug therapy: Secondary | ICD-10-CM | POA: Insufficient documentation

## 2016-08-13 DIAGNOSIS — Z79811 Long term (current) use of aromatase inhibitors: Secondary | ICD-10-CM | POA: Diagnosis not present

## 2016-08-13 DIAGNOSIS — Z923 Personal history of irradiation: Secondary | ICD-10-CM | POA: Diagnosis not present

## 2016-08-13 LAB — CBC WITH DIFFERENTIAL/PLATELET
Basophils Absolute: 0.1 10*3/uL (ref 0–0.1)
Basophils Relative: 1 %
EOS ABS: 0 10*3/uL (ref 0–0.7)
EOS PCT: 0 %
HCT: 31.3 % — ABNORMAL LOW (ref 35.0–47.0)
Hemoglobin: 10.6 g/dL — ABNORMAL LOW (ref 12.0–16.0)
LYMPHS ABS: 1.3 10*3/uL (ref 1.0–3.6)
LYMPHS PCT: 23 %
MCH: 32.1 pg (ref 26.0–34.0)
MCHC: 33.8 g/dL (ref 32.0–36.0)
MCV: 94.8 fL (ref 80.0–100.0)
MONOS PCT: 13 %
Monocytes Absolute: 0.8 10*3/uL (ref 0.2–0.9)
Neutro Abs: 3.7 10*3/uL (ref 1.4–6.5)
Neutrophils Relative %: 63 %
PLATELETS: 375 10*3/uL (ref 150–440)
RBC: 3.3 MIL/uL — AB (ref 3.80–5.20)
RDW: 15.9 % — ABNORMAL HIGH (ref 11.5–14.5)
WBC: 5.9 10*3/uL (ref 3.6–11.0)

## 2016-08-13 LAB — COMPREHENSIVE METABOLIC PANEL
ALK PHOS: 66 U/L (ref 38–126)
ALT: 18 U/L (ref 14–54)
ANION GAP: 7 (ref 5–15)
AST: 21 U/L (ref 15–41)
Albumin: 3.2 g/dL — ABNORMAL LOW (ref 3.5–5.0)
BILIRUBIN TOTAL: 0.2 mg/dL — AB (ref 0.3–1.2)
BUN: 7 mg/dL (ref 6–20)
CALCIUM: 7.9 mg/dL — AB (ref 8.9–10.3)
CO2: 27 mmol/L (ref 22–32)
CREATININE: 0.48 mg/dL (ref 0.44–1.00)
Chloride: 102 mmol/L (ref 101–111)
Glucose, Bld: 88 mg/dL (ref 65–99)
Potassium: 3.4 mmol/L — ABNORMAL LOW (ref 3.5–5.1)
Sodium: 136 mmol/L (ref 135–145)
TOTAL PROTEIN: 6.3 g/dL — AB (ref 6.5–8.1)

## 2016-08-13 MED ORDER — SODIUM CHLORIDE 0.9 % IV SOLN
1100.0000 mg/m2 | Freq: Once | INTRAVENOUS | Status: AC
  Administered 2016-08-13: 1710 mg via INTRAVENOUS
  Filled 2016-08-13: qty 23.93

## 2016-08-13 MED ORDER — HEPARIN SOD (PORK) LOCK FLUSH 100 UNIT/ML IV SOLN
500.0000 [IU] | Freq: Once | INTRAVENOUS | Status: AC | PRN
  Administered 2016-08-13: 500 [IU]

## 2016-08-13 MED ORDER — SODIUM CHLORIDE 0.9 % IV SOLN
Freq: Once | INTRAVENOUS | Status: AC
  Administered 2016-08-13: 11:00:00 via INTRAVENOUS
  Filled 2016-08-13: qty 1000

## 2016-08-13 MED ORDER — HEPARIN SOD (PORK) LOCK FLUSH 100 UNIT/ML IV SOLN
INTRAVENOUS | Status: AC
  Filled 2016-08-13: qty 5

## 2016-08-13 MED ORDER — SODIUM CHLORIDE 0.9 % IV SOLN
10.0000 mg | Freq: Once | INTRAVENOUS | Status: AC
  Administered 2016-08-13: 10 mg via INTRAVENOUS
  Filled 2016-08-13: qty 1

## 2016-08-13 NOTE — Progress Notes (Signed)
States that blisters are starting to heal. Offers no complaints.

## 2016-08-19 ENCOUNTER — Other Ambulatory Visit: Payer: Self-pay | Admitting: Oncology

## 2016-08-19 ENCOUNTER — Telehealth: Payer: Self-pay | Admitting: *Deleted

## 2016-08-19 DIAGNOSIS — R180 Malignant ascites: Secondary | ICD-10-CM

## 2016-08-19 NOTE — Telephone Encounter (Signed)
Per Dr Grayland Ormond, [place] Abd and Paracentesis US guided  If needed. Message sent to scheduling and pt notified

## 2016-08-19 NOTE — Telephone Encounter (Signed)
Called to state that she needs to have fluid drawn off her belly. Please advise

## 2016-08-19 NOTE — Discharge Instructions (Signed)
Paracentesis, Care After °Refer to this sheet in the next few weeks. These instructions provide you with information about caring for yourself after your procedure. Your health care provider may also give you more specific instructions. Your treatment has been planned according to current medical practices, but problems sometimes occur. Call your health care provider if you have any problems or questions after your procedure. °WHAT TO EXPECT AFTER THE PROCEDURE °After your procedure, it is common to have a small amount of clear fluid coming from the puncture site. °HOME CARE INSTRUCTIONS °· Return to your normal activities as told by your health care provider. Ask your health care provider what activities are safe for you. °· Take over-the-counter and prescription medicines only as told by your health care provider. °· Do not take baths, swim, or use a hot tub until your health care provider approves. °· Follow instructions from your health care provider about: °¨ How to take care of your puncture site. °¨ When and how you should change your bandage (dressing). °¨ When you should remove your dressing. °· Check your puncture area every day signs of infection. Watch for: °¨ Redness, swelling, or pain. °¨ Fluid, blood, or pus. °· Keep all follow-up visits as told by your health care provider. This is important. °SEEK MEDICAL CARE IF: °· You have redness, swelling, or pain at your puncture site. °· You start to have more clear fluid coming from your puncture site. °· You have blood or pus coming from your puncture site. °· You have chills. °· You have a fever. °SEEK IMMEDIATE MEDICAL CARE IF: °· You develop chest pain or shortness of breath. °· You develop increasing pain, discomfort, or swelling in your abdomen. °· You feel dizzy or light-headed or you pass out. °  °This information is not intended to replace advice given to you by your health care provider. Make sure you discuss any questions you have with your health  care provider. °  °Document Released: 03/11/2015 Document Reviewed: 03/11/2015 °Elsevier Interactive Patient Education ©2016 [employer] ° °

## 2016-08-20 ENCOUNTER — Ambulatory Visit: Payer: Medicaid Other

## 2016-08-20 ENCOUNTER — Ambulatory Visit
Admission: RE | Admit: 2016-08-20 | Discharge: 2016-08-20 | Disposition: A | Discharge status: Home / Self Care | Payer: Medicaid Other | Source: Ambulatory Visit | Attending: Oncology | Admitting: Oncology

## 2016-08-20 ENCOUNTER — Other Ambulatory Visit: Payer: Self-pay | Admitting: Oncology

## 2016-08-20 DIAGNOSIS — R180 Malignant ascites: Secondary | ICD-10-CM | POA: Diagnosis present

## 2016-08-25 ENCOUNTER — Ambulatory Visit: Payer: Medicaid Other | Admitting: Cardiology

## 2016-08-30 ENCOUNTER — Encounter: Payer: Self-pay | Admitting: Radiology

## 2016-09-01 ENCOUNTER — Ambulatory Visit
Admission: RE | Admit: 2016-09-01 | Discharge: 2016-09-01 | Disposition: A | Discharge status: Home / Self Care | Payer: Medicaid Other | Source: Ambulatory Visit | Attending: Oncology | Admitting: Oncology

## 2016-09-01 ENCOUNTER — Encounter
Admission: RE | Admit: 2016-09-01 | Discharge: 2016-09-01 | Disposition: A | Discharge status: Home / Self Care | Payer: Medicaid Other | Source: Ambulatory Visit | Attending: Oncology | Admitting: Oncology

## 2016-09-01 DIAGNOSIS — D7389 Other diseases of spleen: Secondary | ICD-10-CM | POA: Insufficient documentation

## 2016-09-01 DIAGNOSIS — C50412 Malignant neoplasm of upper-outer quadrant of left female breast: Secondary | ICD-10-CM | POA: Insufficient documentation

## 2016-09-01 DIAGNOSIS — R918 Other nonspecific abnormal finding of lung field: Secondary | ICD-10-CM | POA: Diagnosis not present

## 2016-09-01 DIAGNOSIS — I700 Atherosclerosis of aorta: Secondary | ICD-10-CM | POA: Insufficient documentation

## 2016-09-01 DIAGNOSIS — Z170 Estrogen receptor positive status [ER+]: Secondary | ICD-10-CM | POA: Insufficient documentation

## 2016-09-01 DIAGNOSIS — K769 Liver disease, unspecified: Secondary | ICD-10-CM | POA: Insufficient documentation

## 2016-09-01 DIAGNOSIS — R935 Abnormal findings on diagnostic imaging of other abdominal regions, including retroperitoneum: Secondary | ICD-10-CM | POA: Insufficient documentation

## 2016-09-01 DIAGNOSIS — R188 Other ascites: Secondary | ICD-10-CM | POA: Diagnosis not present

## 2016-09-01 MED ORDER — TECHNETIUM TC 99M MEDRONATE IV KIT
20.4300 | PACK | Freq: Once | INTRAVENOUS | Status: AC | PRN
  Administered 2016-09-01: 20.43 via INTRAVENOUS

## 2016-09-01 MED ORDER — IOPAMIDOL (ISOVUE-300) INJECTION 61%
100.0000 mL | Freq: Once | INTRAVENOUS | Status: AC | PRN
  Administered 2016-09-01: 100 mL via INTRAVENOUS

## 2016-09-01 NOTE — Progress Notes (Signed)
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 ER/PR positive, HER-2/neu not overexpressing.  INTERVAL HISTORY: Patient returns to clinic today for further evaluation and discussion of her imaging results. SHe continues to have increased abdominal swelling. The blistering on her hands and feet is resolved. She otherwise feels well. She does not complain of pain today. She continues to be highly anxious. She has no neurologic complaints. She denies any fevers. She denies chest pain or shortness of breath. She denies any nausea, vomiting, constipation or diarrhea. She has no urinary complaints. Patient offers no further specific complaints.  REVIEW OF SYSTEMS:   Review of Systems  Constitutional: Positive for malaise/fatigue. Negative for fever and weight loss.  Eyes: Negative.   Respiratory: Negative.  Negative for cough and shortness of breath.   Cardiovascular: Negative.  Negative for chest pain and leg swelling.  Gastrointestinal: Negative for abdominal pain, constipation, diarrhea, nausea and vomiting.  Genitourinary: Negative.   Musculoskeletal: Positive for joint pain.  Neurological: Positive for weakness.  Psychiatric/Behavioral: The patient is nervous/anxious.     As per HPI. Otherwise, a complete review of systems is negative.  PAST MEDICAL HISTORY: Past Medical History:  Diagnosis Date  . Asthma 2005  . Breast cancer ([place])   . CAD (coronary artery disease)   . Cancer [facility]) 2012   breast cancer, right-treated with neoadjunvant chemotherapy followed by partial mastectomy with sn bx and radiation. Pt is currently taking Tamoxifen since January 2013   . Hypertension 2012  . Lump or mass in  breast 2012   right  . Malignant neoplasm of upper-outer quadrant of female breast ([place]) 2012   right  . MI (myocardial infarction)   . Myocardial infarction 10/2012  . Personal history of tobacco use, presenting hazards to health     PAST SURGICAL HISTORY: Past Surgical History:  Procedure Laterality Date  . BREAST LUMPECTOMY Right 2012  . CESAREAN SECTION  [id number]  . CORONARY ANGIOPLASTY WITH STENT PLACEMENT  20123   Myocardial infarction  . FINGER SURGERY Left 1986   reconstruction, left pinky  . LEFT HEART CATHETERIZATION WITH CORONARY ANGIOGRAM N/A 10/16/2012   Procedure: LEFT HEART CATHETERIZATION WITH CORONARY ANGIOGRAM;  Surgeon: Clent Demark, MD;  Location: [place] CATH LAB;  Service: Cardiovascular;  Laterality: N/A;  . PORTACATH PLACEMENT    . TUBAL LIGATION  2004  . WRIST SURGERY Left 1987    FAMILY HISTORY Family History  Problem Relation Age of Onset  . Cancer Maternal Grandmother 69    breast       ADVANCED DIRECTIVES:    HEALTH MAINTENANCE: Social History  Substance Use Topics  . Smoking status: Current Every Day Smoker    Packs/day: 0.50    Years: 15.00    Types: Cigarettes  . Smokeless tobacco: Never Used  . Alcohol use No     Allergies  Allergen Reactions  . Codeine Nausea And Vomiting  . Penicillins Rash    Current Outpatient Prescriptions  Medication Sig Dispense Refill  . albuterol (PROVENTIL HFA;VENTOLIN HFA) 108 (90 Base) MCG/ACT inhaler Inhale 2 puffs into the lungs every 6 (six) hours as needed for wheezing or shortness of breath. 1 Inhaler 0  . aspirin EC 81 MG EC tablet Take 1 tablet (  81 mg total) by mouth daily. 30 tablet 3  . nitroGLYCERIN (NITROSTAT) 0.4 MG SL tablet Place 1 tablet (0.4 mg total) under the tongue every 5 (five) minutes x 3 doses as needed for chest pain. 30 tablet 0  . omeprazole (PRILOSEC) 20 MG capsule Take 1 capsule (20 mg total) by mouth daily. 30 capsule 3  . oxyCODONE-acetaminophen (PERCOCET/ROXICET)  5-325 MG tablet Take 1 tablet by mouth at bedtime as needed for severe pain. 30 tablet 0  . potassium chloride SA (K-DUR,KLOR-CON) 20 MEQ tablet Take 1 tablet (20 mEq total) by mouth 2 (two) times daily. 60 tablet 3  . prochlorperazine (COMPAZINE) 10 MG tablet Take 1 tablet (10 mg total) by mouth every 6 (six) hours as needed for nausea or vomiting. 30 tablet 0   No current facility-administered medications for this visit.     OBJECTIVE: Vitals:   09/03/16 1127  BP: 119/87  Pulse: (!) 106  Resp: 18  Temp: 99.1 F (37.3 C)     Body mass index is 21.37 kg/m.    ECOG FS:1 - Symptomatic but completely ambulatory  General: Well-developed, well-nourished, no acute distress. Eyes: anicteric sclera. Breasts: Exam deferred today. Lungs: Clear to auscultation bilaterally. Heart: Regular rate and rhythm. No rubs, murmurs, or gallops. Abdomen: Soft, tender to palpation, nondistended.  Musculoskeletal: Minimal right shoulder mobility secondary to pain. Discoloration and blistering of hands. Neuro: Alert, answering all questions appropriately.  Skin: No rashes or petechiae noted. Psych: Normal affect.   LAB RESULTS:  Lab Results  Component Value Date   NA 136 08/13/2016   K 3.4 (L) 08/13/2016   CL 102 08/13/2016   CO2 27 08/13/2016   GLUCOSE 88 08/13/2016   BUN 7 08/13/2016   CREATININE 0.48 08/13/2016   CALCIUM 7.9 (L) 08/13/2016   PROT 6.3 (L) 08/13/2016   ALBUMIN 3.2 (L) 08/13/2016   AST 21 08/13/2016   ALT 18 08/13/2016   ALKPHOS 66 08/13/2016   BILITOT 0.2 (L) 08/13/2016   GFRNONAA >60 08/13/2016   GFRAA >60 08/13/2016    Lab Results  Component Value Date   WBC 5.9 08/13/2016   NEUTROABS 3.7 08/13/2016   HGB 10.6 (L) 08/13/2016   HCT 31.3 (L) 08/13/2016   MCV 94.8 08/13/2016   PLT 375 08/13/2016   Lab Results  Component Value Date   LABCA2 76.5 (H) 08/06/2016    STUDIES: Ct Chest W Contrast  Result Date: 09/01/2016 CLINICAL DATA:  Staging left breast  cancer. History of right breast cancer in 2012 with partial mastectomy, chemotherapy and radiation therapy. Right arm pain. EXAM: CT CHEST, ABDOMEN, AND PELVIS WITH CONTRAST TECHNIQUE: Multidetector CT imaging of the chest, abdomen and pelvis was performed following the standard protocol during bolus administration of intravenous contrast. CONTRAST:  116m ISOVUE-300 IOPAMIDOL (ISOVUE-300) INJECTION 61% COMPARISON:  Bone scan and CTs of the chest, abdomen and pelvis 05/18/2016. FINDINGS: CT CHEST FINDINGS Cardiovascular: There is atherosclerosis of the aorta, great vessels and coronary arteries. No acute vascular findings are demonstrated. Left subclavian Port-A-Cath extends to the SVC right atrial junction. 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 The heart size is normal. There is no pericardial effusion. Mediastinum/Nodes: There are no enlarged mediastinal, hilar, axillary or internal mammary lymph nodes. Minimal thyroid nodularity appears unchanged. The trachea and esophagus demonstrate no significant findings. Lungs/Pleura: There is no pleural effusion. Stable upper lobe emphysematous changes. 4 mm right upper lobe nodule on image 35 is unchanged. There is a  stable tiny right lung nodule on image 62. The other nodule previously noted is no longer seen. No confluent airspace opacity, endobronchial lesion or suspicious pulmonary nodule. Musculoskeletal/Chest wall: No chest wall mass or suspicious osseous findings. There is stable mildly asymmetric breast tissue with dermal thickening on the right. CT ABDOMEN AND PELVIS FINDINGS Hepatobiliary: Stable tiny low-density hepatic lesions on images 52 and 54. No suspicious findings in the liver. The gallbladder is incompletely distended without wall thickening or surrounding inflammation. No biliary dilatation. Pancreas: Unremarkable. No pancreatic ductal dilatation or surrounding inflammatory changes. Spleen:  Stable in size. There are stable small low-density splenic lesions. Adrenals/Urinary Tract: Both adrenal glands appear normal. The kidneys appear normal without evidence of urinary tract calculus, suspicious lesion or hydronephrosis. No bladder abnormalities are seen. Stomach/Bowel: No evidence of bowel wall thickening, distention or surrounding inflammatory change. The appendix appears normal. Vascular/Lymphatic: There are no enlarged abdominal or pelvic lymph nodes. There is extensive aortic and branch vessel atherosclerosis. No evidence of large vessel occlusion. Reproductive: Hysterectomy.  No adnexal mass. Other: There is progressive ascites. Mild peritoneal nodularity, primarily in the left upper quadrant is stable. No enlarging peritoneal masses seen. Musculoskeletal: No acute or significant osseous findings. IMPRESSION: 1. Progressive ascites since previous CT of 3 months ago, with grossly stable peritoneal nodularity. 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 Paracentesis was not performed on 08/20/2016 due to inadequate pocket of fluid, although might be attempted again in the near future. 2. No other evidence of metastatic disease identified. 3. Stable mild pulmonary nodularity. Stable low-density hepatic and splenic lesions. 4. Diffuse atherosclerosis. Electronically Signed   By: Richardean Sale M.D.   On: 09/01/2016 11:24   Nm Bone Scan Whole Body  Result Date: 09/01/2016 CLINICAL DATA:  MALIGNANT NEOPLASM OF UPPER-OUTER QUADRANT OF LEFT BREAST IN FEMALE, ESTROGEN RECEPTORHX RIGHT BREAST CANCER 2012LAST CHEMO 0CT 6, 2017RIGHT ARM PAIN - SHOULDER DOWN TO FINGERS x 1 YEARABDOMINAL SWELLING AND PAIN x4 WEEKSNO FALLS / ACCIDENTS / TRAUMA IN PAST 6MONTHS EXAM: NUCLEAR MEDICINE WHOLE BODY BONE SCAN TECHNIQUE: Whole body anterior and posterior images were obtained approximately 3 hours after intravenous injection of radiopharmaceutical. RADIOPHARMACEUTICALS:  20.43 mCi  Technetium-20mMDP IV COMPARISON:  Bone scan, 05/18/2016 FINDINGS: There are no areas of abnormal radiotracer localization. No evidence of metastatic disease to bone. Renal uptake is symmetric. No change from prior study. IMPRESSION: Negative exam.  No evidence of metastatic disease to bone. Electronically Signed   By: DLajean ManesM.D.   On: 09/01/2016 14:01   Ct Abdomen Pelvis W Contrast  Result Date: 09/01/2016 CLINICAL DATA:  Staging left breast cancer. History of right breast cancer in 2012 with partial mastectomy, chemotherapy and radiation therapy. Right arm pain. EXAM: CT CHEST, ABDOMEN, AND PELVIS WITH CONTRAST TECHNIQUE: Multidetector CT imaging of the chest, abdomen and pelvis was performed following the standard protocol during bolus administration of intravenous contrast. CONTRAST:  1013mISOVUE-300 IOPAMIDOL (ISOVUE-300) INJECTION 61% COMPARISON:  Bone scan and CTs of the chest, abdomen and pelvis 05/18/2016. FINDINGS: CT CHEST FINDINGS Cardiovascular: There is atherosclerosis of the aorta, great vessels and coronary arteries. No acute vascular findings are demonstrated. Left subclavian Port-A-Cath extends to the SVC right atrial junction. 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 The heart size is normal. There is no pericardial effusion. Mediastinum/Nodes: There are no enlarged mediastinal, hilar, axillary or internal mammary lymph nodes. Minimal thyroid nodularity appears unchanged. The trachea and esophagus demonstrate no significant findings. Lungs/Pleura:  There is no pleural effusion. Stable upper lobe emphysematous changes. 4 mm right upper lobe nodule on image 35 is unchanged. There is a stable tiny right lung nodule on image 62. The other nodule previously noted is no longer seen. No confluent airspace opacity, endobronchial lesion or suspicious pulmonary nodule. Musculoskeletal/Chest wall: No chest wall mass or suspicious osseous  findings. There is stable mildly asymmetric breast tissue with dermal thickening on the right. CT ABDOMEN AND PELVIS FINDINGS Hepatobiliary: Stable tiny low-density hepatic lesions on images 52 and 54. No suspicious findings in the liver. The gallbladder is incompletely distended without wall thickening or surrounding inflammation. No biliary dilatation. Pancreas: Unremarkable. No pancreatic ductal dilatation or surrounding inflammatory changes. Spleen: Stable in size. There are stable small low-density splenic lesions. Adrenals/Urinary Tract: Both adrenal glands appear normal. The kidneys appear normal without evidence of urinary tract calculus, suspicious lesion or hydronephrosis. No bladder abnormalities are seen. Stomach/Bowel: No evidence of bowel wall thickening, distention or surrounding inflammatory change. The appendix appears normal. Vascular/Lymphatic: There are no enlarged abdominal or pelvic lymph nodes. There is extensive aortic and branch vessel atherosclerosis. No evidence of large vessel occlusion. Reproductive: Hysterectomy.  No adnexal mass. Other: There is progressive ascites. Mild peritoneal nodularity, primarily in the left upper quadrant is stable. No enlarging peritoneal masses seen. Musculoskeletal: No acute or significant osseous findings. IMPRESSION: 1. Progressive ascites since previous CT of 3 months ago, with grossly stable peritoneal nodularity. 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 Paracentesis was not performed on 08/20/2016 due to inadequate pocket of fluid, although might be attempted again in the near future. 2. No other evidence of metastatic disease identified. 3. Stable mild pulmonary nodularity. Stable low-density hepatic and splenic lesions. 4. Diffuse atherosclerosis. Electronically Signed   By: Richardean Sale M.D.   On: 09/01/2016 11:24   US Abdomen Limited  Result Date: 08/20/2016 CLINICAL DATA:  Ascites. EXAM: LIMITED ABDOMEN ULTRASOUND  FOR ASCITES TECHNIQUE: Limited ultrasound survey for ascites was performed in all four abdominal quadrants. COMPARISON:  None. FINDINGS: Mild ascites is noted in the right lower quadrant, with minimal ascites seen in the left lower quadrant. No significant ascites is noted in the right upper or left upper quadrants. No adequate fluid pocket for paracentesis is noted. IMPRESSION: Minimal to mild ascites is noted. No adequate fluid pocket for paracentesis is noted, and therefore this was not performed. Electronically Signed   By: Marijo Conception, M.D.   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 ER/PR positive, HER-2/neu not overexpressing.  PLAN:    1. 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 ER/PR positive, HER-2/neu not overexpressing:  Imaging results reviewed independently and reported as above with only residual abdominal nodularity and ascites. Repeat CA 27-29 and CA-125 are pending at time of dictation. Will retry a paracentesis and send for cytology to assess for residual disease.  Plan to also discuss case at [place] next week as well. Will base further follow up on these results and discussion. 2. Right shoulder pain: Continue evaluation and treatment per primary care.   3. Leukopenia: Resolved. Secondary to chemotherapy.  Patient has requested to hold Neulasta in the future if possible. 4. Thrombocytosis: Resolved. 5. Skin blistering/discoloration: Resolved, possibly secondary to Taxotere. 6. Ascites: Repeat paracentesis as above.   Patient expressed understanding and was in agreement with this plan. She also understands that She  can call clinic at any time with any questions, concerns, or complaints.    Lloyd Huger, MD   09/03/2016 9:57 PM

## 2016-09-03 ENCOUNTER — Ambulatory Visit: Payer: Medicaid Other

## 2016-09-03 ENCOUNTER — Inpatient Hospital Stay (HOSPITAL_BASED_OUTPATIENT_CLINIC_OR_DEPARTMENT_OTHER): Payer: Medicaid Other | Admitting: Oncology

## 2016-09-03 VITALS — BP 119/87 | HR 106 | Temp 99.1°F | Resp 18 | Wt 116.8 lb

## 2016-09-03 DIAGNOSIS — C7960 Secondary malignant neoplasm of unspecified ovary: Secondary | ICD-10-CM

## 2016-09-03 DIAGNOSIS — C786 Secondary malignant neoplasm of retroperitoneum and peritoneum: Secondary | ICD-10-CM

## 2016-09-03 DIAGNOSIS — R180 Malignant ascites: Secondary | ICD-10-CM

## 2016-09-03 DIAGNOSIS — Z170 Estrogen receptor positive status [ER+]: Secondary | ICD-10-CM

## 2016-09-03 DIAGNOSIS — C50912 Malignant neoplasm of unspecified site of left female breast: Secondary | ICD-10-CM

## 2016-09-03 DIAGNOSIS — C50412 Malignant neoplasm of upper-outer quadrant of left female breast: Secondary | ICD-10-CM

## 2016-09-03 DIAGNOSIS — Z5111 Encounter for antineoplastic chemotherapy: Secondary | ICD-10-CM | POA: Diagnosis not present

## 2016-09-03 DIAGNOSIS — Z923 Personal history of irradiation: Secondary | ICD-10-CM

## 2016-09-03 DIAGNOSIS — M25511 Pain in right shoulder: Secondary | ICD-10-CM

## 2016-09-03 DIAGNOSIS — R188 Other ascites: Secondary | ICD-10-CM

## 2016-09-03 DIAGNOSIS — F17210 Nicotine dependence, cigarettes, uncomplicated: Secondary | ICD-10-CM

## 2016-09-03 DIAGNOSIS — Z79811 Long term (current) use of aromatase inhibitors: Secondary | ICD-10-CM

## 2016-09-03 MED ORDER — OXYCODONE-ACETAMINOPHEN 5-325 MG PO TABS: 1.0000 | ORAL_TABLET | Freq: Every evening | ORAL | 0 refills | Status: DC | PRN

## 2016-09-03 NOTE — Progress Notes (Signed)
States continues to have right arm pain. Feeling well.

## 2016-09-04 LAB — CA 125: CA 125: 82.5 U/mL — AB (ref 0.0–38.1)

## 2016-09-04 LAB — CANCER ANTIGEN 27.29: CA 27.29: 79.1 U/mL — AB (ref 0.0–38.6)

## 2016-09-07 NOTE — Discharge Instructions (Signed)
Paracentesis, Care After °Refer to this sheet in the next few weeks. These instructions provide you with information about caring for yourself after your procedure. Your health care provider may also give you more specific instructions. Your treatment has been planned according to current medical practices, but problems sometimes occur. Call your health care provider if you have any problems or questions after your procedure. °WHAT TO EXPECT AFTER THE PROCEDURE °After your procedure, it is common to have a small amount of clear fluid coming from the puncture site. °HOME CARE INSTRUCTIONS °· Return to your normal activities as told by your health care provider. Ask your health care provider what activities are safe for you. °· Take over-the-counter and prescription medicines only as told by your health care provider. °· Do not take baths, swim, or use a hot tub until your health care provider approves. °· Follow instructions from your health care provider about: °¨ How to take care of your puncture site. °¨ When and how you should change your bandage (dressing). °¨ When you should remove your dressing. °· Check your puncture area every day signs of infection. Watch for: °¨ Redness, swelling, or pain. °¨ Fluid, blood, or pus. °· Keep all follow-up visits as told by your health care provider. This is important. °SEEK MEDICAL CARE IF: °· You have redness, swelling, or pain at your puncture site. °· You start to have more clear fluid coming from your puncture site. °· You have blood or pus coming from your puncture site. °· You have chills. °· You have a fever. °SEEK IMMEDIATE MEDICAL CARE IF: °· You develop chest pain or shortness of breath. °· You develop increasing pain, discomfort, or swelling in your abdomen. °· You feel dizzy or light-headed or you pass out. °  °This information is not intended to replace advice given to you by your health care provider. Make sure you discuss any questions you have with your health  care provider. °  °Document Released: 03/11/2015 Document Reviewed: 03/11/2015 °Elsevier Interactive Patient Education ©2016 [employer] ° °

## 2016-09-09 ENCOUNTER — Ambulatory Visit
Admission: RE | Admit: 2016-09-09 | Discharge: 2016-09-09 | Disposition: A | Discharge status: Home / Self Care | Payer: Medicaid Other | Source: Ambulatory Visit | Attending: Oncology | Admitting: Oncology

## 2016-09-09 DIAGNOSIS — R180 Malignant ascites: Secondary | ICD-10-CM

## 2016-09-09 DIAGNOSIS — R896 Abnormal cytological findings in specimens from other organs, systems and tissues: Secondary | ICD-10-CM | POA: Insufficient documentation

## 2016-09-09 DIAGNOSIS — R188 Other ascites: Secondary | ICD-10-CM | POA: Insufficient documentation

## 2016-09-09 LAB — BODY FLUID CELL COUNT WITH DIFFERENTIAL
Eos, Fluid: 0 %
LYMPHS FL: 21 %
MONOCYTE-MACROPHAGE-SEROUS FLUID: 75 %
Neutrophil Count, Fluid: 4 %
OTHER CELLS FL: 0 %
Total Nucleated Cell Count, Fluid: 762 cu mm

## 2016-09-12 LAB — BODY FLUID CULTURE: Culture: NO GROWTH

## 2016-09-14 LAB — CYTOLOGY - NON PAP

## 2016-09-15 ENCOUNTER — Other Ambulatory Visit: Payer: Self-pay | Admitting: Neurology

## 2016-09-15 ENCOUNTER — Ambulatory Visit: Payer: Medicaid Other | Admitting: Cardiology

## 2016-09-15 ENCOUNTER — Ambulatory Visit (HOSPITAL_COMMUNITY)
Admission: RE | Admit: 2016-09-15 | Discharge: 2016-09-15 | Disposition: A | Discharge status: Home / Self Care | Payer: Medicaid Other | Source: Ambulatory Visit | Attending: Neurology | Admitting: Neurology

## 2016-09-15 DIAGNOSIS — M25511 Pain in right shoulder: Secondary | ICD-10-CM

## 2016-09-16 ENCOUNTER — Ambulatory Visit (INDEPENDENT_AMBULATORY_CARE_PROVIDER_SITE_OTHER): Payer: Medicaid Other | Admitting: Cardiology

## 2016-09-16 ENCOUNTER — Encounter: Payer: Self-pay | Admitting: Cardiology

## 2016-09-16 VITALS — BP 120/70 | HR 90 | Ht 62.0 in | Wt 115.5 lb

## 2016-09-16 DIAGNOSIS — I2510 Atherosclerotic heart disease of native coronary artery without angina pectoris: Secondary | ICD-10-CM

## 2016-09-16 DIAGNOSIS — F17200 Nicotine dependence, unspecified, uncomplicated: Secondary | ICD-10-CM | POA: Diagnosis not present

## 2016-09-16 NOTE — Progress Notes (Signed)
Cardiology Office Note   Date:  09/16/2016   ID:  [name], DOB 10-04-1971, MRN [id number]  Referring Doctor:  [facility]   Cardiologist:   Wende Bushy, MD   Reason for consultation:  Chief Complaint  Patient presents with  . other    F/u  testing "Doing well". Meds reviewed verbally with pt.      History of Present Illness: [name] is a 45 y.o. female who presents for Follow-up after tests for CAD  Pt has hx of CAD s/p anterolateral MI s/p PCI to LAD 10/2012.   Since last visit, she has had no significant recurrence of any chest pains. She has some shortness of breath but is undergoing treatment for breast cancer. She has significant allergic reaction to a chemotherapeutic agent.  Denies lightheadedness, syncope, palpitations.   ROS:  Please see the history of present illness. Aside from mentioned under HPI, all other systems are reviewed and negative.     Past Medical History:  Diagnosis Date  . Asthma 2005  . Breast cancer ([place])   . CAD (coronary artery disease)   . Cancer [facility]) 2012   breast cancer, right-treated with neoadjunvant chemotherapy followed by partial mastectomy with sn bx and radiation. Pt is currently taking Tamoxifen since January 2013   . Hypertension 2012  . Lump or mass in breast 2012   right  . Malignant neoplasm of upper-outer quadrant of female breast ([place]) 2012   right  . MI (myocardial infarction)   . Myocardial infarction 10/2012  . Personal history of tobacco use, presenting hazards to health     Past Surgical History:  Procedure Laterality Date  . BREAST LUMPECTOMY Right 2012  . CESAREAN SECTION  [id number]  . CORONARY ANGIOPLASTY WITH STENT PLACEMENT  20123   Myocardial infarction  . FINGER SURGERY Left 1986   reconstruction, left pinky  . LEFT HEART CATHETERIZATION WITH CORONARY ANGIOGRAM N/A 10/16/2012   Procedure: LEFT HEART CATHETERIZATION WITH CORONARY ANGIOGRAM;  Surgeon: Clent Demark, MD;  Location: [facility] CATH [facility];  Service: Cardiovascular;  Laterality: N/A;  . PORTACATH PLACEMENT    . TUBAL LIGATION  2004  . WRIST SURGERY Left 1987     reports that she has been smoking Cigarettes.  She has a 7.50 pack-year smoking history. She has never used smokeless tobacco. She reports that she does not drink alcohol or use drugs.   family history includes Cancer (age of onset: 73) in her maternal grandmother.   Outpatient Medications Prior to Visit  Medication Sig Dispense Refill  . albuterol (PROVENTIL HFA;VENTOLIN HFA) 108 (90 Base) MCG/ACT inhaler Inhale 2 puffs into the lungs every 6 (six) hours as needed for wheezing or shortness of breath. 1 Inhaler 0  . aspirin EC 81 MG EC tablet Take 1 tablet (81 mg total) by mouth daily. 30 tablet 3  . nitroGLYCERIN (NITROSTAT) 0.4 MG SL tablet Place 1 tablet (0.4 mg total) under the tongue every 5 (five) minutes x 3 doses as needed for chest pain. 30 tablet 0  . oxyCODONE-acetaminophen (PERCOCET/ROXICET) 5-325 MG tablet Take 1 tablet by mouth at bedtime as needed for severe pain. 30 tablet 0  . potassium chloride SA (K-DUR,KLOR-CON) 20 MEQ tablet Take 1 tablet (20 mEq total) by mouth 2 (two) times daily. 60 tablet 3  . prochlorperazine (COMPAZINE) 10 MG tablet Take 1 tablet (10 mg total) by mouth every 6 (six) hours as needed for nausea or vomiting.  30 tablet 0  . omeprazole (PRILOSEC) 20 MG capsule Take 1 capsule (20 mg total) by mouth daily. 30 capsule 3   No facility-administered medications prior to visit.      Allergies: Codeine and Penicillins    PHYSICAL EXAM: VS:  BP 120/70 (BP Location: Left Arm, Patient Position: Sitting, Cuff Size: Normal)   Pulse 90   Ht 5\' 2"  (1.575 m)   Wt 115 lb 8 oz (52.4 kg)   LMP  (Approximate)   BMI 21.13 kg/m  , Body mass index is 21.13 kg/m. Wt Readings from Last 3 Encounters:  09/16/16 115 lb 8 oz (52.4 kg)  09/03/16 116 lb 13.5 oz (53 kg)  08/13/16 119 lb 4.3 oz (54.1 kg)      GENERAL:  well developed, well nourished, not in acute distress HEENT: normocephalic, pink conjunctivae, anicteric sclerae, no xanthelasma, normal dentition, oropharynx clear NECK:  no neck vein engorgement, JVP normal, no hepatojugular reflux, carotid upstroke brisk and symmetric, no bruit, no thyromegaly, no lymphadenopathy LUNGS:  good respiratory effort, clear to auscultation bilaterally CV:  PMI not displaced, no thrills, no lifts, S1 and S2 within normal limits, no palpable S3 or S4, no murmurs, no rubs, no gallops ABD:  Soft, nontender, nondistended, normoactive bowel sounds, no abdominal aortic bruit, no hepatomegaly, no splenomegaly MS: nontender back, no kyphosis, no scoliosis, no joint deformities EXT:  2+ DP/PT pulses, no edema, no varicosities, no cyanosis, no clubbing SKIN: warm, nondiaphoretic, normal turgor, no ulcers NEUROPSYCH: alert, oriented to person, place, and time, sensory/motor grossly intact, normal mood, appropriate affect  Recent Labs: 10/20/2015: Magnesium 1.9 08/13/2016: ALT 18; BUN 7; Creatinine, Ser 0.48; Hemoglobin 10.6; Platelets 375; Potassium 3.4; Sodium 136   Lipid Panel    Component Value Date/Time   CHOL 146 10/17/2012 0520   TRIG 112 10/17/2012 0520   HDL 25 (L) 10/17/2012 0520   CHOLHDL 5.8 10/17/2012 0520   VLDL 22 10/17/2012 0520   LDLCALC 99 10/17/2012 0520     Other studies Reviewed:  EKG:  The ekg from 07/14/2016 was personally reviewed by me and it revealed sinus rhythm, 90 BPM, ST and T-wave abnormality, T-wave inversions in the anterolateral leads.  Additional studies/ records that were reviewed personally reviewed by me today include: Echo 08/03/2016: Procedure narrative: Transthoracic echocardiography. Image   quality was poor. The study was technically difficult, as a   result of poor acoustic windows. - Left ventricle: The cavity size was normal. Systolic function was   normal. The estimated ejection fraction was in the range  of 50%   to 55%. Images were inadequate for LV wall motion assessment. The   study is not technically sufficient to allow evaluation of LV   diastolic function. - Pulmonary arteries: Systolic pressure could not be accurately   estimated.  Nuclear stress is 07/29/2016: Pharmacological myocardial perfusion imaging study with no significant  ischemia  Small region of fixed defect of mild severity in the anteroseptal wall consistent with breast attenuation artifact. Attenuation correction not performed on this study Normal wall motion, EF estimated at 69% No EKG changes concerning for ischemia at peak stress or in recovery. Low risk scan    ASSESSMENT AND PLAN: CAD s/p Anterolateral wall MI, s/p PCI to LAD 10/2012 CP SOB No evidence of ischemia on stress test. EF is within normal limits. In meantime cont meds. Not on BB or ACE-I -- per pt, could not tolerate due to low BP. Not on statin -- awaiting records from prev  cardiologist. Recommend to check fasting lipid panel.  Tobacco use We discussed the importance of smoking cessation and different strategies for quitting.    Current medicines are reviewed at length with the patient today.  The patient does not have concerns regarding medicines.  Labs/ tests ordered today include:  Orders Placed This Encounter  Procedures  . Lipid Profile    I had a lengthy and detailed discussion with the patient regarding diagnoses, prognosis, diagnostic options, treatment options , and side effects of medications.   I counseled the patient on importance of lifestyle modification including heart healthy diet, regular physical activity, and smoking cessation.   Disposition:   FU with undersigned in 6 months Signed, Wende Bushy, MD  09/16/2016 11:53 AM    [place]  This note was generated in part with voice recognition software and I apologize for any typographical errors that were not detected and corrected.

## 2016-09-16 NOTE — Patient Instructions (Signed)
Labwork: Your physician recommends that you return for lab work. We would like you to have a Fasting lipid panel. Make sure to not eat or drink after midnight prior to having this done. You may have a sip of water with medications.   Follow-Up: Your physician wants you to follow-up in: 6 months with Dr. Yvone Neu. You will receive a reminder letter in the mail two months in advance. If you don't receive a letter, please call our office to schedule the follow-up appointment.  It was a pleasure seeing you today here in the office. Please do not hesitate to give [place] a call back if you have any further questions. [place], BSN

## 2016-09-20 ENCOUNTER — Telehealth: Payer: Self-pay | Admitting: *Deleted

## 2016-09-20 NOTE — Telephone Encounter (Signed)
Patient would like MD to call her with her results.

## 2016-09-22 ENCOUNTER — Ambulatory Visit (HOSPITAL_COMMUNITY): Payer: Medicaid Other | Attending: Neurology | Admitting: Specialist

## 2016-10-13 ENCOUNTER — Telehealth: Payer: Self-pay | Admitting: Oncology

## 2016-10-13 NOTE — Telephone Encounter (Signed)
Per scheduling note on 10/27 visit, further fu tbd. Pt called and lvm asking if she should come in this Friday for lab and flush. Please advise. Thanks.

## 2016-10-13 NOTE — Telephone Encounter (Signed)
Yes.  Lab, see md this Friday,

## 2016-10-15 ENCOUNTER — Inpatient Hospital Stay (HOSPITAL_BASED_OUTPATIENT_CLINIC_OR_DEPARTMENT_OTHER): Payer: Medicaid Other | Admitting: Oncology

## 2016-10-15 ENCOUNTER — Other Ambulatory Visit: Payer: Self-pay | Admitting: *Deleted

## 2016-10-15 ENCOUNTER — Inpatient Hospital Stay: Payer: Medicaid Other | Attending: Oncology

## 2016-10-15 ENCOUNTER — Inpatient Hospital Stay: Payer: Medicaid Other

## 2016-10-15 DIAGNOSIS — C50912 Malignant neoplasm of unspecified site of left female breast: Secondary | ICD-10-CM

## 2016-10-15 DIAGNOSIS — Z170 Estrogen receptor positive status [ER+]: Secondary | ICD-10-CM | POA: Insufficient documentation

## 2016-10-15 DIAGNOSIS — R188 Other ascites: Secondary | ICD-10-CM | POA: Diagnosis not present

## 2016-10-15 DIAGNOSIS — I252 Old myocardial infarction: Secondary | ICD-10-CM | POA: Diagnosis not present

## 2016-10-15 DIAGNOSIS — C50412 Malignant neoplasm of upper-outer quadrant of left female breast: Secondary | ICD-10-CM

## 2016-10-15 DIAGNOSIS — I10 Essential (primary) hypertension: Secondary | ICD-10-CM | POA: Insufficient documentation

## 2016-10-15 DIAGNOSIS — F17210 Nicotine dependence, cigarettes, uncomplicated: Secondary | ICD-10-CM | POA: Insufficient documentation

## 2016-10-15 DIAGNOSIS — Z79899 Other long term (current) drug therapy: Secondary | ICD-10-CM

## 2016-10-15 DIAGNOSIS — C786 Secondary malignant neoplasm of retroperitoneum and peritoneum: Secondary | ICD-10-CM | POA: Diagnosis not present

## 2016-10-15 DIAGNOSIS — I2510 Atherosclerotic heart disease of native coronary artery without angina pectoris: Secondary | ICD-10-CM | POA: Diagnosis not present

## 2016-10-15 DIAGNOSIS — Z79811 Long term (current) use of aromatase inhibitors: Secondary | ICD-10-CM | POA: Diagnosis not present

## 2016-10-15 DIAGNOSIS — M25511 Pain in right shoulder: Secondary | ICD-10-CM | POA: Diagnosis not present

## 2016-10-15 DIAGNOSIS — Z923 Personal history of irradiation: Secondary | ICD-10-CM | POA: Insufficient documentation

## 2016-10-15 DIAGNOSIS — Z955 Presence of coronary angioplasty implant and graft: Secondary | ICD-10-CM | POA: Insufficient documentation

## 2016-10-15 DIAGNOSIS — J45909 Unspecified asthma, uncomplicated: Secondary | ICD-10-CM | POA: Insufficient documentation

## 2016-10-15 DIAGNOSIS — Z7982 Long term (current) use of aspirin: Secondary | ICD-10-CM | POA: Diagnosis not present

## 2016-10-15 DIAGNOSIS — C7960 Secondary malignant neoplasm of unspecified ovary: Secondary | ICD-10-CM | POA: Diagnosis not present

## 2016-10-15 LAB — CBC WITH DIFFERENTIAL/PLATELET
BASOS PCT: 1 %
Basophils Absolute: 0.1 10*3/uL (ref 0–0.1)
EOS ABS: 0 10*3/uL (ref 0–0.7)
EOS PCT: 1 %
HEMATOCRIT: 37.9 % (ref 35.0–47.0)
Hemoglobin: 12.9 g/dL (ref 12.0–16.0)
Lymphocytes Relative: 18 %
Lymphs Abs: 1.4 10*3/uL (ref 1.0–3.6)
MCH: 32.3 pg (ref 26.0–34.0)
MCHC: 33.9 g/dL (ref 32.0–36.0)
MCV: 95.2 fL (ref 80.0–100.0)
MONO ABS: 0.5 10*3/uL (ref 0.2–0.9)
MONOS PCT: 7 %
Neutro Abs: 5.4 10*3/uL (ref 1.4–6.5)
Neutrophils Relative %: 73 %
PLATELETS: 284 10*3/uL (ref 150–440)
RBC: 3.98 MIL/uL (ref 3.80–5.20)
RDW: 16.4 % — AB (ref 11.5–14.5)
WBC: 7.4 10*3/uL (ref 3.6–11.0)

## 2016-10-15 LAB — COMPREHENSIVE METABOLIC PANEL
ALBUMIN: 3.5 g/dL (ref 3.5–5.0)
ALT: 12 U/L — ABNORMAL LOW (ref 14–54)
ANION GAP: 10 (ref 5–15)
AST: 17 U/L (ref 15–41)
Alkaline Phosphatase: 68 U/L (ref 38–126)
BILIRUBIN TOTAL: 0.5 mg/dL (ref 0.3–1.2)
BUN: 8 mg/dL (ref 6–20)
CHLORIDE: 102 mmol/L (ref 101–111)
CO2: 24 mmol/L (ref 22–32)
Calcium: 8.3 mg/dL — ABNORMAL LOW (ref 8.9–10.3)
Creatinine, Ser: 0.57 mg/dL (ref 0.44–1.00)
GFR calc Af Amer: >60 mL/min (ref >60)
GFR calc non Af Amer: >60 mL/min (ref >60)
GLUCOSE: 87 mg/dL (ref 65–99)
POTASSIUM: 3.9 mmol/L (ref 3.5–5.1)
Sodium: 136 mmol/L (ref 135–145)
TOTAL PROTEIN: 6.7 g/dL (ref 6.5–8.1)

## 2016-10-15 MED ORDER — SODIUM CHLORIDE 0.9% FLUSH
10.0000 mL | INTRAVENOUS | Status: DC | PRN
  Administered 2016-10-15: 10 mL via INTRAVENOUS
  Filled 2016-10-15: qty 10

## 2016-10-15 MED ORDER — HEPARIN SOD (PORK) LOCK FLUSH 100 UNIT/ML IV SOLN
500.0000 [IU] | Freq: Once | INTRAVENOUS | Status: AC
  Administered 2016-10-15: 500 [IU] via INTRAVENOUS
  Filled 2016-10-15: qty 5

## 2016-10-15 MED ORDER — OXYCODONE-ACETAMINOPHEN 5-325 MG PO TABS: 1.0000 | ORAL_TABLET | Freq: Every evening | ORAL | 0 refills | Status: DC | PRN

## 2016-10-15 NOTE — Progress Notes (Signed)
Continues to complain of right arm discomfort. Requests refill of pain medication and to check tumor marker.

## 2016-10-15 NOTE — Addendum Note (Signed)
Addended by: Zara Chess on: 10/15/2016 11:23 AM   Modules accepted: Orders

## 2016-10-16 LAB — CA 125: CA 125: 53.5 U/mL — AB (ref 0.0–38.1)

## 2016-10-16 LAB — CANCER ANTIGEN 27.29: CA 27.29: 77.6 U/mL — ABNORMAL HIGH (ref 0.0–38.6)

## 2016-10-17 NOTE — Progress Notes (Signed)
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 ER/PR positive, HER-2/neu not overexpressing.  INTERVAL HISTORY: Patient returns to clinic today for further evaluation and laboratory work. Only complaint today is of continued left shoulder pain. She does not have any further abdominal swelling. The blistering on her hands and feet has resolved. She otherwise feels well. She does not complain of any other pain today. She continues to be highly anxious. She has no neurologic complaints. She denies any fevers. She denies chest pain or shortness of breath. She denies any nausea, vomiting, constipation or diarrhea. She has no urinary complaints. Patient offers no further specific complaints.  REVIEW OF SYSTEMS:   Review of Systems  Constitutional: Positive for malaise/fatigue. Negative for fever and weight loss.  Eyes: Negative.   Respiratory: Negative.  Negative for cough and shortness of breath.   Cardiovascular: Negative.  Negative for chest pain and leg swelling.  Gastrointestinal: Negative for abdominal pain, constipation, diarrhea, nausea and vomiting.  Genitourinary: Negative.   Musculoskeletal: Positive for joint pain.  Neurological: Positive for weakness.  Psychiatric/Behavioral: The patient is nervous/anxious.     As per HPI. Otherwise, a complete review of systems is negative.  PAST MEDICAL HISTORY: Past Medical History:  Diagnosis Date  . Asthma 2005  . Breast cancer ([place])   . CAD (coronary artery disease)   . Cancer [facility] 2012   breast cancer, right-treated with neoadjunvant chemotherapy followed by partial mastectomy with sn bx and radiation. Pt is currently taking Tamoxifen since January 2013    . Hypertension 2012  . Lump or mass in breast 2012   right  . Malignant neoplasm of upper-outer quadrant of female breast ([place]) 2012   right  . MI (myocardial infarction)   . Myocardial infarction 10/2012  . Personal history of tobacco use, presenting hazards to health     PAST SURGICAL HISTORY: Past Surgical History:  Procedure Laterality Date  . BREAST LUMPECTOMY Right 2012  . CESAREAN SECTION  [id number]  . CORONARY ANGIOPLASTY WITH STENT PLACEMENT  20123   Myocardial infarction  . FINGER SURGERY Left 1986   reconstruction, left pinky  . LEFT HEART CATHETERIZATION WITH CORONARY ANGIOGRAM N/A 10/16/2012   Procedure: LEFT HEART CATHETERIZATION WITH CORONARY ANGIOGRAM;  Surgeon: Clent Demark, MD;  Location: [facility] CATH [facility];  Service: Cardiovascular;  Laterality: N/A;  . PORTACATH PLACEMENT    . TUBAL LIGATION  2004  . WRIST SURGERY Left 1987    FAMILY HISTORY Family History  Problem Relation Age of Onset  . Cancer Maternal Grandmother 69    breast       ADVANCED DIRECTIVES:    HEALTH MAINTENANCE: Social History  Substance Use Topics  . Smoking status: Current Every Day Smoker    Packs/day: 0.50    Years: 15.00    Types: Cigarettes  . Smokeless tobacco: Never Used  . Alcohol use No     Allergies  Allergen Reactions  . Codeine Nausea And Vomiting  . Penicillins Rash    Current Outpatient Prescriptions  Medication Sig Dispense Refill  . albuterol (PROVENTIL HFA;VENTOLIN HFA) 108 (90 Base) MCG/ACT inhaler Inhale 2 puffs into the lungs every 6 (six) hours as needed for wheezing or shortness of breath. 1 Inhaler 0  .  aspirin EC 81 MG EC tablet Take 1 tablet (81 mg total) by mouth daily. 30 tablet 3  . nitroGLYCERIN (NITROSTAT) 0.4 MG SL tablet Place 1 tablet (0.4 mg total) under the tongue every 5 (five) minutes x 3 doses as needed for chest pain. 30 tablet 0  . omeprazole (PRILOSEC) 20 MG capsule Take 1 capsule (20 mg total) by mouth daily. 30 capsule 3  .  oxyCODONE-acetaminophen (PERCOCET/ROXICET) 5-325 MG tablet Take 1 tablet by mouth at bedtime as needed for severe pain. 30 tablet 0  . potassium chloride SA (K-DUR,KLOR-CON) 20 MEQ tablet Take 1 tablet (20 mEq total) by mouth 2 (two) times daily. 60 tablet 3  . prochlorperazine (COMPAZINE) 10 MG tablet Take 1 tablet (10 mg total) by mouth every 6 (six) hours as needed for nausea or vomiting. 30 tablet 0   No current facility-administered medications for this visit.     OBJECTIVE: Vitals:   10/15/16 1016  BP: (!) 137/92  Pulse: 98  Resp: 18  Temp: 98.2 F (36.8 C)     Body mass index is 20.62 kg/m.    ECOG FS:1 - Symptomatic but completely ambulatory  General: Well-developed, well-nourished, no acute distress. Eyes: anicteric sclera. Breasts: Exam deferred today. Lungs: Clear to auscultation bilaterally. Heart: Regular rate and rhythm. No rubs, murmurs, or gallops. Abdomen: Soft, tender to palpation, nondistended.  Musculoskeletal: Minimal right shoulder mobility secondary to pain. Discoloration and blistering of hands. Neuro: Alert, answering all questions appropriately.  Skin: No rashes or petechiae noted. Psych: Normal affect.   LAB RESULTS:  Lab Results  Component Value Date   NA 136 10/15/2016   K 3.9 10/15/2016   CL 102 10/15/2016   CO2 24 10/15/2016   GLUCOSE 87 10/15/2016   BUN 8 10/15/2016   CREATININE 0.57 10/15/2016   CALCIUM 8.3 (L) 10/15/2016   PROT 6.7 10/15/2016   ALBUMIN 3.5 10/15/2016   AST 17 10/15/2016   ALT 12 (L) 10/15/2016   ALKPHOS 68 10/15/2016   BILITOT 0.5 10/15/2016   GFRNONAA >60 10/15/2016   GFRAA >60 10/15/2016    Lab Results  Component Value Date   WBC 7.4 10/15/2016   NEUTROABS 5.4 10/15/2016   HGB 12.9 10/15/2016   HCT 37.9 10/15/2016   MCV 95.2 10/15/2016   PLT 284 10/15/2016   Lab Results  Component Value Date   LABCA2 77.6 (H) 10/15/2016   Lab Results  Component Value Date   CA125 53.5 (H) 10/15/2016     STUDIES: No results found.  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 ER/PR positive, HER-2/neu not overexpressing.  PLAN:    1. 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 ER/PR positive, HER-2/neu not overexpressing:  Imaging results reviewed independently with only residual abdominal nodularity and ascites. Repeat CA 27-29 and CA-125 are elevated, but unchanged since October. Repeat paracentesis only remove 600 mL of fluid. Cytology was negative for residual disease. After lengthy discussion with the patient, treatment is not necessary at this time but she did acknowledge that she will likely need additional chemotherapy in the future. Will not use Taxotere given her difficulties with recent treatments. Have discussed with patient the possibility of using Ibrance. Return to clinic in approximately 3 weeks for laboratory work only. Patient will then return to clinic at the end of January for repeat imaging and further evaluation.  2. Right shoulder pain: Continue evaluation and treatment per primary  care.   3. Leukopenia: Resolved. Secondary to chemotherapy.  Patient has requested to hold Neulasta in the future if possible. 4. Thrombocytosis: Resolved. 5. Skin blistering/discoloration: Resolved, possibly secondary to Taxotere. 6. Ascites: Repeat paracentesis as above.   Patient expressed understanding and was in agreement with this plan. She also understands that She can call clinic at any time with any questions, concerns, or complaints.    Lloyd Huger, MD   10/17/2016 12:47 PM

## 2016-10-29 ENCOUNTER — Telehealth (HOSPITAL_COMMUNITY): Payer: Self-pay | Admitting: Specialist

## 2016-10-29 NOTE — Telephone Encounter (Signed)
Called Dr Freddie Apley office l/m requested instruction to continue to schedule this patient or to disregard this referral. Waiting on instructions at this time. Called pt last night and today no answer. NF 10/29/2016

## 2016-11-05 ENCOUNTER — Inpatient Hospital Stay: Payer: Medicaid Other

## 2016-11-05 DIAGNOSIS — C50412 Malignant neoplasm of upper-outer quadrant of left female breast: Secondary | ICD-10-CM | POA: Diagnosis not present

## 2016-11-05 DIAGNOSIS — C50912 Malignant neoplasm of unspecified site of left female breast: Secondary | ICD-10-CM

## 2016-11-05 LAB — COMPREHENSIVE METABOLIC PANEL
ALK PHOS: 70 U/L (ref 38–126)
ALT: 13 U/L — ABNORMAL LOW (ref 14–54)
ANION GAP: 7 (ref 5–15)
AST: 18 U/L (ref 15–41)
Albumin: 3.4 g/dL — ABNORMAL LOW (ref 3.5–5.0)
BILIRUBIN TOTAL: 0.3 mg/dL (ref 0.3–1.2)
BUN: 5 mg/dL — ABNORMAL LOW (ref 6–20)
CALCIUM: 8.3 mg/dL — AB (ref 8.9–10.3)
CO2: 27 mmol/L (ref 22–32)
Chloride: 100 mmol/L — ABNORMAL LOW (ref 101–111)
Creatinine, Ser: 0.51 mg/dL (ref 0.44–1.00)
Glucose, Bld: 101 mg/dL — ABNORMAL HIGH (ref 65–99)
Potassium: 3.5 mmol/L (ref 3.5–5.1)
Sodium: 134 mmol/L — ABNORMAL LOW (ref 135–145)
TOTAL PROTEIN: 6.4 g/dL — AB (ref 6.5–8.1)

## 2016-11-05 LAB — CBC WITH DIFFERENTIAL/PLATELET
Basophils Absolute: 0.1 10*3/uL (ref 0–0.1)
Basophils Relative: 1 %
Eosinophils Absolute: 0 10*3/uL (ref 0–0.7)
Eosinophils Relative: 1 %
HEMATOCRIT: 41 % (ref 35.0–47.0)
HEMOGLOBIN: 14 g/dL (ref 12.0–16.0)
LYMPHS ABS: 1.2 10*3/uL (ref 1.0–3.6)
Lymphocytes Relative: 15 %
MCH: 31.8 pg (ref 26.0–34.0)
MCHC: 34 g/dL (ref 32.0–36.0)
MCV: 93.4 fL (ref 80.0–100.0)
MONO ABS: 0.5 10*3/uL (ref 0.2–0.9)
MONOS PCT: 6 %
NEUTROS ABS: 6.4 10*3/uL (ref 1.4–6.5)
NEUTROS PCT: 77 %
Platelets: 287 10*3/uL (ref 150–440)
RBC: 4.39 MIL/uL (ref 3.80–5.20)
RDW: 15.7 % — ABNORMAL HIGH (ref 11.5–14.5)
WBC: 8.2 10*3/uL (ref 3.6–11.0)

## 2016-11-05 MED ORDER — HEPARIN SOD (PORK) LOCK FLUSH 100 UNIT/ML IV SOLN
500.0000 [IU] | Freq: Once | INTRAVENOUS | Status: AC
  Administered 2016-11-05: 500 [IU] via INTRAVENOUS
  Filled 2016-11-05: qty 5

## 2016-11-05 MED ORDER — SODIUM CHLORIDE 0.9% FLUSH
10.0000 mL | INTRAVENOUS | Status: AC | PRN
  Administered 2016-11-05: 10 mL via INTRAVENOUS
  Filled 2016-11-05: qty 10

## 2016-11-06 LAB — CA 125: CA 125: 49.4 U/mL — AB (ref 0.0–38.1)

## 2016-11-06 LAB — CANCER ANTIGEN 27.29: CA 27.29: 82.7 U/mL — ABNORMAL HIGH (ref 0.0–38.6)

## 2016-12-07 ENCOUNTER — Inpatient Hospital Stay: Payer: Medicaid Other

## 2016-12-07 ENCOUNTER — Ambulatory Visit
Admission: RE | Admit: 2016-12-07 | Discharge: 2016-12-07 | Disposition: A | Discharge status: Home / Self Care | Payer: Medicaid Other | Source: Ambulatory Visit | Attending: Oncology | Admitting: Oncology

## 2016-12-07 DIAGNOSIS — Z90710 Acquired absence of both cervix and uterus: Secondary | ICD-10-CM | POA: Diagnosis not present

## 2016-12-07 DIAGNOSIS — C50912 Malignant neoplasm of unspecified site of left female breast: Secondary | ICD-10-CM | POA: Diagnosis present

## 2016-12-07 DIAGNOSIS — M519 Unspecified thoracic, thoracolumbar and lumbosacral intervertebral disc disorder: Secondary | ICD-10-CM | POA: Diagnosis not present

## 2016-12-07 DIAGNOSIS — D739 Disease of spleen, unspecified: Secondary | ICD-10-CM | POA: Insufficient documentation

## 2016-12-07 DIAGNOSIS — C50412 Malignant neoplasm of upper-outer quadrant of left female breast: Secondary | ICD-10-CM

## 2016-12-07 DIAGNOSIS — R188 Other ascites: Secondary | ICD-10-CM | POA: Insufficient documentation

## 2016-12-07 DIAGNOSIS — K769 Liver disease, unspecified: Secondary | ICD-10-CM | POA: Diagnosis not present

## 2016-12-07 MED ORDER — IOPAMIDOL (ISOVUE-300) INJECTION 61%
100.0000 mL | Freq: Once | INTRAVENOUS | Status: AC | PRN
  Administered 2016-12-07: 100 mL via INTRAVENOUS

## 2016-12-08 NOTE — Progress Notes (Signed)
[name]  [phone] [phone]  ID: [name] OB: 03/02/1971  MR#: [id number]  CSN#:[id number]  Patient Care Team: [facility] as PCP - General  CHIEF COMPLAINT: Stage IV left upper outer quadrant lobular breast cancer with ovarian and abdominal metastasis. ER/PR positive, HER-2/neu not overexpressing.  INTERVAL HISTORY: Patient returns to clinic today for further evaluation and laboratory work.  She complains of chronic weakness and fatigue.  She continues to have left shoulder pain radiating to her head and neck.  She does not complain of any other pain today.  Her abdomen is swollen and feels tight.  She reports acid reflux, and nausea and vomiting two days ago.  She reports an "okay" appetite, denies weight loss.  She reports chronic cough and shortness of breath.  She had diarrhea recently after CT contrast ingestion, not an ongoing issue.  She continues to be highly anxious.  She has no neurologic complaints. She denies any fevers, chills, or recent illnesses. She denies chest pain. She denies any constipation or diarrhea. She has no urinary complaints. She denies hematuria and hematochezia.  She reports difficulty getting to sleep.  Patient offers no further specific complaints.  REVIEW OF SYSTEMS:   Review of Systems  Constitutional: Positive for malaise/fatigue. Negative for fever and weight loss.  Eyes: Negative.   Respiratory: Positive for shortness of breath. Negative for cough.   Cardiovascular: Negative.  Negative for chest pain and leg swelling.  Gastrointestinal: Positive for heartburn. Negative for abdominal pain, blood in stool, constipation, diarrhea, nausea and vomiting.  Genitourinary: Negative.   Musculoskeletal: Positive for joint pain.  Skin: Negative.   Neurological: Positive for weakness and headaches. Negative for tingling and sensory change.  Psychiatric/Behavioral: The patient is  nervous/anxious and has insomnia.     As per HPI. Otherwise, a complete review of systems is negative.  PAST MEDICAL HISTORY: Past Medical History:  Diagnosis Date  . Asthma 2005  . Breast cancer ([place])   . CAD (coronary artery disease)   . Cancer [facility]) 2012   breast cancer, right-treated with neoadjunvant chemotherapy followed by partial mastectomy with sn bx and radiation. Pt is currently taking Tamoxifen since January 2013   . Hypertension 2012  . Lump or mass in breast 2012   right  . Malignant neoplasm of upper-outer quadrant of female breast ([place]) 2012   right  . MI (myocardial infarction)   . Myocardial infarction 10/2012  . Personal history of tobacco use, presenting hazards to health     PAST SURGICAL HISTORY: Past Surgical History:  Procedure Laterality Date  . BREAST LUMPECTOMY Right 2012  . CESAREAN SECTION  [id number]  . CORONARY ANGIOPLASTY WITH STENT PLACEMENT  20123   Myocardial infarction  . FINGER SURGERY Left 1986   reconstruction, left pinky  . LEFT HEART CATHETERIZATION WITH CORONARY ANGIOGRAM N/A 10/16/2012   Procedure: LEFT HEART CATHETERIZATION WITH CORONARY ANGIOGRAM;  Surgeon: Clent Demark, MD;  Location: [facility] CATH [facility];  Service: Cardiovascular;  Laterality: N/A;  . PORTACATH PLACEMENT    . TUBAL LIGATION  2004  . WRIST SURGERY Left 1987    FAMILY HISTORY Family History  Problem Relation Age of Onset  . Cancer Maternal Grandmother 69    breast       ADVANCED DIRECTIVES:    HEALTH MAINTENANCE: Social History  Substance Use Topics  . Smoking status: Current Every Day Smoker    Packs/day: 0.50    Years: 15.00  Types: Cigarettes  . Smokeless tobacco: Never Used  . Alcohol use No     Allergies  Allergen Reactions  . Codeine Nausea And Vomiting  . Penicillins Rash    Current Outpatient Prescriptions  Medication Sig Dispense Refill  . albuterol (PROVENTIL HFA;VENTOLIN HFA) 108 (90 Base) MCG/ACT inhaler Inhale 2 puffs into the  lungs every 6 (six) hours as needed for wheezing or shortness of breath. 1 Inhaler 0  . aspirin EC 81 MG EC tablet Take 1 tablet (81 mg total) by mouth daily. 30 tablet 3  . nitroGLYCERIN (NITROSTAT) 0.4 MG SL tablet Place 1 tablet (0.4 mg total) under the tongue every 5 (five) minutes x 3 doses as needed for chest pain. 30 tablet 0  . oxyCODONE-acetaminophen (PERCOCET/ROXICET) 5-325 MG tablet Take 1 tablet by mouth at bedtime as needed for severe pain. 30 tablet 0  . prochlorperazine (COMPAZINE) 10 MG tablet Take 1 tablet (10 mg total) by mouth every 6 (six) hours as needed for nausea or vomiting. 30 tablet 0  . letrozole (FEMARA) 2.5 MG tablet Take 1 tablet (2.5 mg total) by mouth daily. 90 tablet 3  . omeprazole (PRILOSEC) 20 MG capsule Take 1 capsule (20 mg total) by mouth daily. 30 capsule 3  . potassium chloride SA (K-DUR,KLOR-CON) 20 MEQ tablet Take 1 tablet (20 mEq total) by mouth 2 (two) times daily. (Patient not taking: Reported on 12/10/2016) 60 tablet 3   No current facility-administered medications for this visit.    Facility-Administered Medications Ordered in Other Visits  Medication Dose Route Frequency Provider Last Rate Last Dose  . sodium chloride flush (NS) 0.9 % injection 10 mL  10 mL Intravenous PRN Lloyd Huger, MD   10 mL at 11/05/16 1035  . sodium chloride flush (NS) 0.9 % injection 10 mL  10 mL Intravenous PRN Lloyd Huger, MD   10 mL at 12/10/16 1153    OBJECTIVE: Vitals:   12/10/16 1209  BP: 129/90  Pulse: 92  Temp: 98.8 F (37.1 C)     Body mass index is 20.58 kg/m.    ECOG FS:1 - Symptomatic but completely ambulatory  General: Well-developed, well-nourished, no acute distress. Eyes: anicteric sclera. Breasts: Exam deferred today. Lungs: Clear to auscultation bilaterally. Heart: Regular rate and rhythm. No rubs, murmurs, or gallops. Abdomen: Firm, distended, nontender.  Musculoskeletal: Minimal right shoulder mobility secondary to pain.  Neuro:  Alert, answering all questions appropriately.  Skin: No rashes or petechiae noted. Psych: Normal affect.   LAB RESULTS:  Lab Results  Component Value Date   NA 133 (L) 12/10/2016   K 3.5 12/10/2016   CL 101 12/10/2016   CO2 25 12/10/2016   GLUCOSE 117 (H) 12/10/2016   BUN 6 12/10/2016   CREATININE 0.60 12/10/2016   CALCIUM 8.2 (L) 12/10/2016   PROT 7.0 12/10/2016   ALBUMIN 3.4 (L) 12/10/2016   AST 18 12/10/2016   ALT 9 (L) 12/10/2016   ALKPHOS 73 12/10/2016   BILITOT 0.2 (L) 12/10/2016   GFRNONAA >60 12/10/2016   GFRAA >60 12/10/2016    Lab Results  Component Value Date   WBC 7.3 12/10/2016   NEUTROABS 5.3 12/10/2016   HGB 13.3 12/10/2016   HCT 38.8 12/10/2016   MCV 90.5 12/10/2016   PLT 338 12/10/2016   Lab Results  Component Value Date   LABCA2 82.7 (H) 11/05/2016   Lab Results  Component Value Date   CA125 49.4 (H) 11/05/2016    STUDIES: Ct Chest W Contrast  Result Date: 12/07/2016 CLINICAL DATA:  Metastatic breast cancer, status post lumpectomy and chemotherapy. Ovarian and abdominal metastases. Abdominal bloating/swelling. EXAM: CT CHEST, ABDOMEN, AND PELVIS WITH CONTRAST TECHNIQUE: Multidetector CT imaging of the chest, abdomen and pelvis was performed following the standard protocol during bolus administration of intravenous contrast. CONTRAST:  123m ISOVUE-300 IOPAMIDOL (ISOVUE-300) INJECTION 61% COMPARISON:  CT chest abdomen pelvis dated 09/01/2016. Whole-body bone scan dated 09/01/2016. FINDINGS: CT CHEST FINDINGS Cardiovascular: The heart is normal in size. Trace pericardial fluid inferiorly. Coronary atherosclerosis in the LAD. Mild ectasia of the ascending thoracic aorta, measuring 3.3 cm. Left chest port terminates at the cavoatrial junction. Mediastinum/Nodes: No suspicious mediastinal lymphadenopathy. Visualized thyroid is notable for a 7 mm right thyroid nodule (series 2/image 6). Lungs/Pleura: Mild centrilobular and paraseptal emphysematous changes.  Mild mosaic attenuation. Mild atelectasis in the medial right middle lobe (series 5/ image 104). No suspicious pulmonary nodules. No pleural effusion or pneumothorax. Musculoskeletal: Mild skin thickening overlying the right breast, possibly reflecting radiation changes. Mild scarring in the right axillary tail (series 2/image 31). Mild degenerative changes of the thoracic spine. Scattered tiny sclerotic lesions in the visualized thoracic spine, measuring up to 6 mm in the T6 vertebral body (series 2/ image 25). While the CT appearance is not suspicious, and there are no definite findings on prior bone scan, these lesions are new from 2016, which at least raises the possibility of subtle sclerotic osseous metastases. CT ABDOMEN PELVIS FINDINGS Hepatobiliary: Two 4 mm hypoenhancing lesions in the central liver (series 2/ image 52 and 54), unchanged. Gallbladder is unremarkable. No intrahepatic or extrahepatic ductal dilatation. Pancreas: Within normal limits. Spleen: Stable 5 mm hypoenhancing lesion in the central spleen (series 2/image 56). Adrenals/Urinary Tract: Adrenal glands are within normal limits. Kidneys are within normal limits.  No hydronephrosis. Bladder is within normal limits. Stomach/Bowel: Stomach is within normal limits. No evidence of bowel obstruction. Appendix is mildly thick walled (series 2/ image 95), although without associated inflammatory changes. Left colon is decompressed.  Scattered sigmoid diverticuli. Vascular/Lymphatic: No evidence of abdominal aortic aneurysm. Atherosclerotic calcifications of the abdominal aorta and branch vessels. No suspicious abdominopelvic lymphadenopathy. Reproductive: Status post hysterectomy. No adnexal masses. Other: Moderate to large volume abdominopelvic ascites, mildly increased. Associated peritoneal thickening (series 2/ images 75 and 106) with mass effect. Suspected mild peritoneal disease/omental caking along the left upper abdomen (series 2/ images 69  and 72), grossly unchanged. Musculoskeletal: Scattered tiny sclerotic lesions in the lumbar spine and right iliac bone, new from 2016. IMPRESSION: Moderate to large volume abdominopelvic ascites, mildly increased. Associated mild peritoneal disease/ omental caking along the left upper abdomen, grossly unchanged. Scattered tiny sclerotic lesions involving the visualized thoracolumbar spine, measuring up to 6 mm in the T6 vertebral body, new from 2016. While these were not positive on prior bone scan, the progression at least raises the possibility of subtle osseous metastases. Otherwise, no evidence of metastatic disease in the chest. Additional stable ancillary findings as above. Electronically Signed   By: SJulian HyM.D.   On: 12/07/2016 17:26   Ct Abdomen Pelvis W Contrast  Result Date: 12/07/2016 CLINICAL DATA:  Metastatic breast cancer, status post lumpectomy and chemotherapy. Ovarian and abdominal metastases. Abdominal bloating/swelling. EXAM: CT CHEST, ABDOMEN, AND PELVIS WITH CONTRAST TECHNIQUE: Multidetector CT imaging of the chest, abdomen and pelvis was performed following the standard protocol during bolus administration of intravenous contrast. CONTRAST:  1060mISOVUE-300 IOPAMIDOL (ISOVUE-300) INJECTION 61% COMPARISON:  CT chest abdomen pelvis dated 09/01/2016. Whole-body bone scan  dated 09/01/2016. FINDINGS: CT CHEST FINDINGS Cardiovascular: The heart is normal in size. Trace pericardial fluid inferiorly. Coronary atherosclerosis in the LAD. Mild ectasia of the ascending thoracic aorta, measuring 3.3 cm. Left chest port terminates at the cavoatrial junction. Mediastinum/Nodes: No suspicious mediastinal lymphadenopathy. Visualized thyroid is notable for a 7 mm right thyroid nodule (series 2/image 6). Lungs/Pleura: Mild centrilobular and paraseptal emphysematous changes. Mild mosaic attenuation. Mild atelectasis in the medial right middle lobe (series 5/ image 104). No suspicious pulmonary  nodules. No pleural effusion or pneumothorax. Musculoskeletal: Mild skin thickening overlying the right breast, possibly reflecting radiation changes. Mild scarring in the right axillary tail (series 2/image 31). Mild degenerative changes of the thoracic spine. Scattered tiny sclerotic lesions in the visualized thoracic spine, measuring up to 6 mm in the T6 vertebral body (series 2/ image 25). While the CT appearance is not suspicious, and there are no definite findings on prior bone scan, these lesions are new from 2016, which at least raises the possibility of subtle sclerotic osseous metastases. CT ABDOMEN PELVIS FINDINGS Hepatobiliary: Two 4 mm hypoenhancing lesions in the central liver (series 2/ image 52 and 54), unchanged. Gallbladder is unremarkable. No intrahepatic or extrahepatic ductal dilatation. Pancreas: Within normal limits. Spleen: Stable 5 mm hypoenhancing lesion in the central spleen (series 2/image 56). Adrenals/Urinary Tract: Adrenal glands are within normal limits. Kidneys are within normal limits.  No hydronephrosis. Bladder is within normal limits. Stomach/Bowel: Stomach is within normal limits. No evidence of bowel obstruction. Appendix is mildly thick walled (series 2/ image 95), although without associated inflammatory changes. Left colon is decompressed.  Scattered sigmoid diverticuli. Vascular/Lymphatic: No evidence of abdominal aortic aneurysm. Atherosclerotic calcifications of the abdominal aorta and branch vessels. No suspicious abdominopelvic lymphadenopathy. Reproductive: Status post hysterectomy. No adnexal masses. Other: Moderate to large volume abdominopelvic ascites, mildly increased. Associated peritoneal thickening (series 2/ images 75 and 106) with mass effect. Suspected mild peritoneal disease/omental caking along the left upper abdomen (series 2/ images 69 and 72), grossly unchanged. Musculoskeletal: Scattered tiny sclerotic lesions in the lumbar spine and right iliac bone,  new from 2016. IMPRESSION: Moderate to large volume abdominopelvic ascites, mildly increased. Associated mild peritoneal disease/ omental caking along the left upper abdomen, grossly unchanged. Scattered tiny sclerotic lesions involving the visualized thoracolumbar spine, measuring up to 6 mm in the T6 vertebral body, new from 2016. While these were not positive on prior bone scan, the progression at least raises the possibility of subtle osseous metastases. Otherwise, no evidence of metastatic disease in the chest. Additional stable ancillary findings as above. Electronically Signed   By: Julian Hy M.D.   On: 12/07/2016 17:26    ASSESSMENT: Stage IV left upper outer quadrant lobular breast cancer with ovarian and abdominal metastasis. ER/PR positive, HER-2/neu not overexpressing.  PLAN:    1. 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 ER/PR positive, HER-2/neu not overexpressing:  Imaging results reviewed independently with only residual abdominal nodularity and ascites. Repeat CA 27-29 and CA-125 are elevated, but unchanged since October. Today's results are pending.  Paracentesis on September 09, 2016 only removed 600 mL of fluid. Cytology was negative for residual disease. After lengthy discussion with the patient, treatment is not necessary at this time but she did acknowledge that she will likely need additional chemotherapy in the future. Will not use Taxotere given her difficulties with recent treatments. Have discussed with patient the possibility of using Ibrance.  Patient will initiate taking letrozole today.  Return to clinic in approximately 6 weeks for laboratory work only. Patient will then return to clinic in three months for repeat imaging, bone mineral density scan, and further evaluation.  Will consider initiation of Fosamax based on bone density scan results. 2. Right shoulder pain: Continue evaluation and treatment per primary  care.   3. Leukopenia: Resolved. Secondary to chemotherapy.  Patient has requested to hold Neulasta in the future if possible. 4. Thrombocytosis: Resolved. 5. Skin blistering/discoloration: Resolved, possibly secondary to Taxotere. 6. Ascites: Will not repeat at this time due to previous minimal benefit.  Monitor. 7. Insomia: Discussed 4-7-8 breathing technique at bedtime: breathe in to count of 4, hold breath for count of 7, exhale for count of 8; do 3-5 times for letting go of overactive thoughts.  Also discussed sleep hygiene: dark bedroom, discontinuing screen (computer, phone, tablet, T.V.) usage 1-2 hours prior to bedtime, not eating or drinking 2 hours prior to bedtime, and limiting daytime naps to 20-45 minutes.  Provided breath ratio chart to patient for relaxing, balancing, and energizing breathing techniques.   Patient expressed understanding and was in agreement with this plan. She also understands that She can call clinic at any time with any questions, concerns, or complaints.   Lucendia Herrlich, NP  12/10/16 2:40 PM   Patient was seen and evaluated independently and I agree with the assessment and plan as dictated above. Patient was previously on letrozole and tolerated it well. Goal will be to minimize amount of chemotherapy patient requires in the future.  Lloyd Huger, MD 12/10/16 2:41 PM

## 2016-12-10 ENCOUNTER — Inpatient Hospital Stay: Payer: Medicaid Other | Attending: Oncology | Admitting: Oncology

## 2016-12-10 ENCOUNTER — Inpatient Hospital Stay: Payer: Medicaid Other

## 2016-12-10 VITALS — BP 129/90 | HR 92 | Temp 98.8°F | Wt 112.5 lb

## 2016-12-10 DIAGNOSIS — C7989 Secondary malignant neoplasm of other specified sites: Secondary | ICD-10-CM

## 2016-12-10 DIAGNOSIS — Z90710 Acquired absence of both cervix and uterus: Secondary | ICD-10-CM | POA: Insufficient documentation

## 2016-12-10 DIAGNOSIS — Z170 Estrogen receptor positive status [ER+]: Secondary | ICD-10-CM

## 2016-12-10 DIAGNOSIS — I10 Essential (primary) hypertension: Secondary | ICD-10-CM | POA: Diagnosis not present

## 2016-12-10 DIAGNOSIS — I252 Old myocardial infarction: Secondary | ICD-10-CM | POA: Insufficient documentation

## 2016-12-10 DIAGNOSIS — Z7982 Long term (current) use of aspirin: Secondary | ICD-10-CM | POA: Insufficient documentation

## 2016-12-10 DIAGNOSIS — Z79811 Long term (current) use of aromatase inhibitors: Secondary | ICD-10-CM

## 2016-12-10 DIAGNOSIS — M25512 Pain in left shoulder: Secondary | ICD-10-CM

## 2016-12-10 DIAGNOSIS — K219 Gastro-esophageal reflux disease without esophagitis: Secondary | ICD-10-CM | POA: Insufficient documentation

## 2016-12-10 DIAGNOSIS — Z923 Personal history of irradiation: Secondary | ICD-10-CM

## 2016-12-10 DIAGNOSIS — J45909 Unspecified asthma, uncomplicated: Secondary | ICD-10-CM | POA: Insufficient documentation

## 2016-12-10 DIAGNOSIS — R188 Other ascites: Secondary | ICD-10-CM

## 2016-12-10 DIAGNOSIS — F17210 Nicotine dependence, cigarettes, uncomplicated: Secondary | ICD-10-CM | POA: Diagnosis not present

## 2016-12-10 DIAGNOSIS — C50912 Malignant neoplasm of unspecified site of left female breast: Secondary | ICD-10-CM

## 2016-12-10 DIAGNOSIS — Z95828 Presence of other vascular implants and grafts: Secondary | ICD-10-CM

## 2016-12-10 DIAGNOSIS — C50412 Malignant neoplasm of upper-outer quadrant of left female breast: Secondary | ICD-10-CM

## 2016-12-10 DIAGNOSIS — Z7189 Other specified counseling: Secondary | ICD-10-CM | POA: Insufficient documentation

## 2016-12-10 DIAGNOSIS — Z955 Presence of coronary angioplasty implant and graft: Secondary | ICD-10-CM | POA: Diagnosis not present

## 2016-12-10 DIAGNOSIS — I2510 Atherosclerotic heart disease of native coronary artery without angina pectoris: Secondary | ICD-10-CM | POA: Insufficient documentation

## 2016-12-10 DIAGNOSIS — Z9221 Personal history of antineoplastic chemotherapy: Secondary | ICD-10-CM

## 2016-12-10 LAB — COMPREHENSIVE METABOLIC PANEL
ALBUMIN: 3.4 g/dL — AB (ref 3.5–5.0)
ALK PHOS: 73 U/L (ref 38–126)
ALT: 9 U/L — AB (ref 14–54)
ANION GAP: 7 (ref 5–15)
AST: 18 U/L (ref 15–41)
BUN: 6 mg/dL (ref 6–20)
CALCIUM: 8.2 mg/dL — AB (ref 8.9–10.3)
CO2: 25 mmol/L (ref 22–32)
CREATININE: 0.6 mg/dL (ref 0.44–1.00)
Chloride: 101 mmol/L (ref 101–111)
GFR calc Af Amer: >60 mL/min (ref >60)
GFR calc non Af Amer: >60 mL/min (ref >60)
GLUCOSE: 117 mg/dL — AB (ref 65–99)
Potassium: 3.5 mmol/L (ref 3.5–5.1)
SODIUM: 133 mmol/L — AB (ref 135–145)
Total Bilirubin: 0.2 mg/dL — ABNORMAL LOW (ref 0.3–1.2)
Total Protein: 7 g/dL (ref 6.5–8.1)

## 2016-12-10 LAB — CBC WITH DIFFERENTIAL/PLATELET
BASOS ABS: 0.1 10*3/uL (ref 0–0.1)
BASOS PCT: 1 %
EOS ABS: 0 10*3/uL (ref 0–0.7)
Eosinophils Relative: 1 %
HCT: 38.8 % (ref 35.0–47.0)
HEMOGLOBIN: 13.3 g/dL (ref 12.0–16.0)
Lymphocytes Relative: 18 %
Lymphs Abs: 1.3 10*3/uL (ref 1.0–3.6)
MCH: 31 pg (ref 26.0–34.0)
MCHC: 34.3 g/dL (ref 32.0–36.0)
MCV: 90.5 fL (ref 80.0–100.0)
Monocytes Absolute: 0.5 10*3/uL (ref 0.2–0.9)
Monocytes Relative: 7 %
NEUTROS ABS: 5.3 10*3/uL (ref 1.4–6.5)
NEUTROS PCT: 73 %
Platelets: 338 10*3/uL (ref 150–440)
RBC: 4.29 MIL/uL (ref 3.80–5.20)
RDW: 15.5 % — ABNORMAL HIGH (ref 11.5–14.5)
WBC: 7.3 10*3/uL (ref 3.6–11.0)

## 2016-12-10 MED ORDER — HEPARIN SOD (PORK) LOCK FLUSH 100 UNIT/ML IV SOLN
INTRAVENOUS | Status: AC
  Filled 2016-12-10: qty 5

## 2016-12-10 MED ORDER — HEPARIN SOD (PORK) LOCK FLUSH 100 UNIT/ML IV SOLN
500.0000 [IU] | Freq: Once | INTRAVENOUS | Status: AC
  Administered 2016-12-10: 500 [IU] via INTRAVENOUS

## 2016-12-10 MED ORDER — OXYCODONE-ACETAMINOPHEN 5-325 MG PO TABS: 1.0000 | ORAL_TABLET | Freq: Every evening | ORAL | 0 refills | Status: DC | PRN

## 2016-12-10 MED ORDER — SODIUM CHLORIDE 0.9% FLUSH
10.0000 mL | INTRAVENOUS | Status: DC | PRN
  Administered 2016-12-10: 10 mL via INTRAVENOUS
  Filled 2016-12-10: qty 10

## 2016-12-10 MED ORDER — LETROZOLE 2.5 MG PO TABS: 2.5000 mg | ORAL_TABLET | Freq: Every day | ORAL | 3 refills | Status: DC

## 2016-12-10 NOTE — Progress Notes (Signed)
Patient c/o abdominal, vitals stable and documented.

## 2016-12-11 LAB — CANCER ANTIGEN 27.29: CA 27.29: 91.9 U/mL — AB (ref 0.0–38.6)

## 2016-12-11 LAB — CA 125: CA 125: 58.5 U/mL — ABNORMAL HIGH (ref 0.0–38.1)

## 2016-12-13 ENCOUNTER — Other Ambulatory Visit: Payer: Self-pay | Admitting: Oncology

## 2017-01-21 ENCOUNTER — Inpatient Hospital Stay: Payer: Medicaid Other | Attending: Oncology

## 2017-01-21 ENCOUNTER — Inpatient Hospital Stay: Payer: Medicaid Other

## 2017-01-21 DIAGNOSIS — Z170 Estrogen receptor positive status [ER+]: Secondary | ICD-10-CM | POA: Insufficient documentation

## 2017-01-21 DIAGNOSIS — C7989 Secondary malignant neoplasm of other specified sites: Secondary | ICD-10-CM | POA: Insufficient documentation

## 2017-01-21 DIAGNOSIS — K219 Gastro-esophageal reflux disease without esophagitis: Secondary | ICD-10-CM | POA: Insufficient documentation

## 2017-01-21 DIAGNOSIS — F17210 Nicotine dependence, cigarettes, uncomplicated: Secondary | ICD-10-CM | POA: Diagnosis not present

## 2017-01-21 DIAGNOSIS — Z95828 Presence of other vascular implants and grafts: Secondary | ICD-10-CM

## 2017-01-21 DIAGNOSIS — I10 Essential (primary) hypertension: Secondary | ICD-10-CM | POA: Insufficient documentation

## 2017-01-21 DIAGNOSIS — Z79811 Long term (current) use of aromatase inhibitors: Secondary | ICD-10-CM | POA: Diagnosis not present

## 2017-01-21 DIAGNOSIS — Z90710 Acquired absence of both cervix and uterus: Secondary | ICD-10-CM | POA: Insufficient documentation

## 2017-01-21 DIAGNOSIS — I252 Old myocardial infarction: Secondary | ICD-10-CM | POA: Diagnosis not present

## 2017-01-21 DIAGNOSIS — R188 Other ascites: Secondary | ICD-10-CM | POA: Diagnosis not present

## 2017-01-21 DIAGNOSIS — Z923 Personal history of irradiation: Secondary | ICD-10-CM | POA: Diagnosis not present

## 2017-01-21 DIAGNOSIS — Z955 Presence of coronary angioplasty implant and graft: Secondary | ICD-10-CM | POA: Diagnosis not present

## 2017-01-21 DIAGNOSIS — C50412 Malignant neoplasm of upper-outer quadrant of left female breast: Secondary | ICD-10-CM | POA: Insufficient documentation

## 2017-01-21 DIAGNOSIS — Z7982 Long term (current) use of aspirin: Secondary | ICD-10-CM | POA: Diagnosis not present

## 2017-01-21 DIAGNOSIS — Z9221 Personal history of antineoplastic chemotherapy: Secondary | ICD-10-CM | POA: Diagnosis not present

## 2017-01-21 DIAGNOSIS — M25512 Pain in left shoulder: Secondary | ICD-10-CM | POA: Diagnosis not present

## 2017-01-21 DIAGNOSIS — J45909 Unspecified asthma, uncomplicated: Secondary | ICD-10-CM | POA: Insufficient documentation

## 2017-01-21 DIAGNOSIS — I2510 Atherosclerotic heart disease of native coronary artery without angina pectoris: Secondary | ICD-10-CM | POA: Insufficient documentation

## 2017-01-21 DIAGNOSIS — C50912 Malignant neoplasm of unspecified site of left female breast: Secondary | ICD-10-CM

## 2017-01-21 LAB — COMPREHENSIVE METABOLIC PANEL
ALBUMIN: 3 g/dL — AB (ref 3.5–5.0)
ALT: 8 U/L — AB (ref 14–54)
AST: 19 U/L (ref 15–41)
Alkaline Phosphatase: 88 U/L (ref 38–126)
Anion gap: 9 (ref 5–15)
BILIRUBIN TOTAL: 0.6 mg/dL (ref 0.3–1.2)
BUN: 7 mg/dL (ref 6–20)
CHLORIDE: 98 mmol/L — AB (ref 101–111)
CO2: 25 mmol/L (ref 22–32)
CREATININE: 0.57 mg/dL (ref 0.44–1.00)
Calcium: 7.9 mg/dL — ABNORMAL LOW (ref 8.9–10.3)
GFR calc Af Amer: >60 mL/min (ref >60)
GLUCOSE: 89 mg/dL (ref 65–99)
POTASSIUM: 3.3 mmol/L — AB (ref 3.5–5.1)
Sodium: 132 mmol/L — ABNORMAL LOW (ref 135–145)
Total Protein: 6.5 g/dL (ref 6.5–8.1)

## 2017-01-21 LAB — CBC WITH DIFFERENTIAL/PLATELET
BASOS ABS: 0 10*3/uL (ref 0–0.1)
BASOS PCT: 1 %
Eosinophils Absolute: 0 10*3/uL (ref 0–0.7)
Eosinophils Relative: 1 %
HEMATOCRIT: 32.3 % — AB (ref 35.0–47.0)
Hemoglobin: 11.2 g/dL — ABNORMAL LOW (ref 12.0–16.0)
LYMPHS PCT: 14 %
Lymphs Abs: 0.8 10*3/uL — ABNORMAL LOW (ref 1.0–3.6)
MCH: 31 pg (ref 26.0–34.0)
MCHC: 34.7 g/dL (ref 32.0–36.0)
MCV: 89.3 fL (ref 80.0–100.0)
Monocytes Absolute: 0.4 10*3/uL (ref 0.2–0.9)
Monocytes Relative: 7 %
NEUTROS ABS: 4.7 10*3/uL (ref 1.4–6.5)
NEUTROS PCT: 77 %
PLATELETS: 347 10*3/uL (ref 150–440)
RBC: 3.62 MIL/uL — AB (ref 3.80–5.20)
RDW: 14.8 % — ABNORMAL HIGH (ref 11.5–14.5)
WBC: 6.1 10*3/uL (ref 3.6–11.0)

## 2017-01-21 MED ORDER — HEPARIN SOD (PORK) LOCK FLUSH 100 UNIT/ML IV SOLN
500.0000 [IU] | Freq: Once | INTRAVENOUS | Status: AC
Start: 2017-01-21 — End: 2017-01-21
  Administered 2017-01-21: 500 [IU] via INTRAVENOUS

## 2017-01-21 MED ORDER — HEPARIN SOD (PORK) LOCK FLUSH 100 UNIT/ML IV SOLN
INTRAVENOUS | Status: AC
  Filled 2017-01-21: qty 5

## 2017-01-21 MED ORDER — SODIUM CHLORIDE 0.9% FLUSH
10.0000 mL | INTRAVENOUS | Status: DC | PRN
  Administered 2017-01-21: 10 mL via INTRAVENOUS
  Filled 2017-01-21: qty 10

## 2017-01-22 LAB — CA 125: CA 125: 98.7 U/mL — AB (ref 0.0–38.1)

## 2017-01-22 LAB — CANCER ANTIGEN 27.29: CA 27.29: 100.5 U/mL — ABNORMAL HIGH (ref 0.0–38.6)

## 2017-01-24 ENCOUNTER — Ambulatory Visit
Admission: RE | Admit: 2017-01-24 | Discharge: 2017-01-24 | Disposition: A | Discharge status: Home / Self Care | Payer: Medicaid Other | Source: Ambulatory Visit | Attending: Oncology | Admitting: Oncology

## 2017-01-24 DIAGNOSIS — C50919 Malignant neoplasm of unspecified site of unspecified female breast: Secondary | ICD-10-CM | POA: Diagnosis present

## 2017-01-24 DIAGNOSIS — M818 Other osteoporosis without current pathological fracture: Secondary | ICD-10-CM | POA: Diagnosis not present

## 2017-01-24 DIAGNOSIS — Z79811 Long term (current) use of aromatase inhibitors: Secondary | ICD-10-CM | POA: Insufficient documentation

## 2017-01-24 DIAGNOSIS — C50912 Malignant neoplasm of unspecified site of left female breast: Secondary | ICD-10-CM

## 2017-03-09 ENCOUNTER — Inpatient Hospital Stay: Payer: Medicaid Other | Attending: Oncology

## 2017-03-09 ENCOUNTER — Ambulatory Visit
Admission: RE | Admit: 2017-03-09 | Discharge: 2017-03-09 | Disposition: A | Discharge status: Home / Self Care | Payer: Medicaid Other | Source: Ambulatory Visit | Attending: Oncology | Admitting: Oncology

## 2017-03-09 ENCOUNTER — Other Ambulatory Visit: Payer: Self-pay

## 2017-03-09 ENCOUNTER — Inpatient Hospital Stay: Payer: Medicaid Other

## 2017-03-09 DIAGNOSIS — Z79899 Other long term (current) drug therapy: Secondary | ICD-10-CM | POA: Diagnosis not present

## 2017-03-09 DIAGNOSIS — I700 Atherosclerosis of aorta: Secondary | ICD-10-CM | POA: Insufficient documentation

## 2017-03-09 DIAGNOSIS — Z170 Estrogen receptor positive status [ER+]: Secondary | ICD-10-CM | POA: Insufficient documentation

## 2017-03-09 DIAGNOSIS — M25511 Pain in right shoulder: Secondary | ICD-10-CM | POA: Diagnosis not present

## 2017-03-09 DIAGNOSIS — C50912 Malignant neoplasm of unspecified site of left female breast: Secondary | ICD-10-CM

## 2017-03-09 DIAGNOSIS — C50412 Malignant neoplasm of upper-outer quadrant of left female breast: Secondary | ICD-10-CM

## 2017-03-09 DIAGNOSIS — F17210 Nicotine dependence, cigarettes, uncomplicated: Secondary | ICD-10-CM | POA: Insufficient documentation

## 2017-03-09 DIAGNOSIS — R188 Other ascites: Secondary | ICD-10-CM | POA: Insufficient documentation

## 2017-03-09 DIAGNOSIS — I871 Compression of vein: Secondary | ICD-10-CM | POA: Diagnosis not present

## 2017-03-09 DIAGNOSIS — Z79811 Long term (current) use of aromatase inhibitors: Secondary | ICD-10-CM | POA: Insufficient documentation

## 2017-03-09 DIAGNOSIS — I2510 Atherosclerotic heart disease of native coronary artery without angina pectoris: Secondary | ICD-10-CM | POA: Diagnosis not present

## 2017-03-09 DIAGNOSIS — I10 Essential (primary) hypertension: Secondary | ICD-10-CM | POA: Diagnosis not present

## 2017-03-09 DIAGNOSIS — Z7982 Long term (current) use of aspirin: Secondary | ICD-10-CM | POA: Diagnosis not present

## 2017-03-09 DIAGNOSIS — Z955 Presence of coronary angioplasty implant and graft: Secondary | ICD-10-CM | POA: Diagnosis not present

## 2017-03-09 DIAGNOSIS — I252 Old myocardial infarction: Secondary | ICD-10-CM | POA: Insufficient documentation

## 2017-03-09 DIAGNOSIS — J45909 Unspecified asthma, uncomplicated: Secondary | ICD-10-CM | POA: Diagnosis not present

## 2017-03-09 DIAGNOSIS — Z9221 Personal history of antineoplastic chemotherapy: Secondary | ICD-10-CM | POA: Diagnosis not present

## 2017-03-09 DIAGNOSIS — Z90710 Acquired absence of both cervix and uterus: Secondary | ICD-10-CM | POA: Insufficient documentation

## 2017-03-09 DIAGNOSIS — M899 Disorder of bone, unspecified: Secondary | ICD-10-CM | POA: Insufficient documentation

## 2017-03-09 DIAGNOSIS — Z923 Personal history of irradiation: Secondary | ICD-10-CM | POA: Insufficient documentation

## 2017-03-09 DIAGNOSIS — C184 Malignant neoplasm of transverse colon: Secondary | ICD-10-CM | POA: Diagnosis not present

## 2017-03-09 DIAGNOSIS — K219 Gastro-esophageal reflux disease without esophagitis: Secondary | ICD-10-CM | POA: Insufficient documentation

## 2017-03-09 DIAGNOSIS — C7989 Secondary malignant neoplasm of other specified sites: Secondary | ICD-10-CM | POA: Insufficient documentation

## 2017-03-09 LAB — COMPREHENSIVE METABOLIC PANEL
ALT: 10 U/L — AB (ref 14–54)
ANION GAP: 9 (ref 5–15)
AST: 21 U/L (ref 15–41)
Albumin: 3.3 g/dL — ABNORMAL LOW (ref 3.5–5.0)
Alkaline Phosphatase: 92 U/L (ref 38–126)
BUN: 9 mg/dL (ref 6–20)
CALCIUM: 8.2 mg/dL — AB (ref 8.9–10.3)
CHLORIDE: 101 mmol/L (ref 101–111)
CO2: 24 mmol/L (ref 22–32)
CREATININE: 0.51 mg/dL (ref 0.44–1.00)
Glucose, Bld: 87 mg/dL (ref 65–99)
Potassium: 3.9 mmol/L (ref 3.5–5.1)
Sodium: 134 mmol/L — ABNORMAL LOW (ref 135–145)
Total Bilirubin: 0.2 mg/dL — ABNORMAL LOW (ref 0.3–1.2)
Total Protein: 6.7 g/dL (ref 6.5–8.1)

## 2017-03-09 LAB — CBC WITH DIFFERENTIAL/PLATELET
Basophils Absolute: 0.1 10*3/uL (ref 0–0.1)
Basophils Relative: 1 %
EOS ABS: 0 10*3/uL (ref 0–0.7)
EOS PCT: 1 %
HCT: 34.6 % — ABNORMAL LOW (ref 35.0–47.0)
Hemoglobin: 11.7 g/dL — ABNORMAL LOW (ref 12.0–16.0)
LYMPHS ABS: 1.1 10*3/uL (ref 1.0–3.6)
LYMPHS PCT: 20 %
MCH: 29.7 pg (ref 26.0–34.0)
MCHC: 33.8 g/dL (ref 32.0–36.0)
MCV: 87.8 fL (ref 80.0–100.0)
MONO ABS: 0.5 10*3/uL (ref 0.2–0.9)
MONOS PCT: 9 %
Neutro Abs: 3.9 10*3/uL (ref 1.4–6.5)
Neutrophils Relative %: 69 %
Platelets: 353 10*3/uL (ref 150–440)
RBC: 3.95 MIL/uL (ref 3.80–5.20)
RDW: 15.2 % — ABNORMAL HIGH (ref 11.5–14.5)
WBC: 5.6 10*3/uL (ref 3.6–11.0)

## 2017-03-09 MED ORDER — HEPARIN SOD (PORK) LOCK FLUSH 100 UNIT/ML IV SOLN
500.0000 [IU] | Freq: Once | INTRAVENOUS | Status: AC
  Administered 2017-03-09: 500 [IU] via INTRAVENOUS

## 2017-03-09 MED ORDER — IOPAMIDOL (ISOVUE-300) INJECTION 61%: 100.0000 mL | Freq: Once | INTRAVENOUS | Status: DC | PRN

## 2017-03-09 MED ORDER — IOPAMIDOL (ISOVUE-300) INJECTION 61%
75.0000 mL | Freq: Once | INTRAVENOUS | Status: AC | PRN
  Administered 2017-03-09: 75 mL via INTRAVENOUS

## 2017-03-09 MED ORDER — SODIUM CHLORIDE 0.9% FLUSH
10.0000 mL | INTRAVENOUS | Status: DC | PRN
  Administered 2017-03-09: 10 mL via INTRAVENOUS
  Filled 2017-03-09: qty 10

## 2017-03-10 LAB — CA 125: CA 125: 80.5 U/mL — AB (ref 0.0–38.1)

## 2017-03-10 LAB — CANCER ANTIGEN 27.29: CA 27.29: 116.7 U/mL — ABNORMAL HIGH (ref 0.0–38.6)

## 2017-03-11 ENCOUNTER — Ambulatory Visit: Payer: Medicaid Other | Admitting: Oncology

## 2017-03-16 NOTE — Progress Notes (Signed)
[place]  [phone] [phone]) [phone]  ID: IDALEE FOXWORTHY OB: 1971-07-31  MR#: [id number]  TUU#:[id number]  Patient Care Team: [facility] as PCP - General  CHIEF COMPLAINT: Stage IV left upper outer quadrant lobular breast cancer with ovarian and abdominal metastasis. ER/PR positive, HER-2/neu not overexpressing.  INTERVAL HISTORY: Patient returns to clinic today for further evaluation, laboratory work, and discussion of her imaging results. She continues to have chronic weakness and fatigue. She continues to have left shoulder pain radiating to her head and neck.  She does not complain of any other pain today.  Her abdomen is swollen and feels tight which contributes to increased shortness of breath.  She reports acid reflux. She continues to be highly anxious.  She has no neurologic complaints. She denies any fevers, chills, or recent illnesses. She denies chest pain. She denies any constipation or diarrhea. She has no urinary complaints. She denies hematuria and hematochezia.  She reports difficulty getting to sleep.  Patient offers no further specific complaints.  REVIEW OF SYSTEMS:   Review of Systems  Constitutional: Positive for malaise/fatigue. Negative for fever and weight loss.  Eyes: Negative.   Respiratory: Positive for shortness of breath. Negative for cough.   Cardiovascular: Negative.  Negative for chest pain and leg swelling.  Gastrointestinal: Positive for heartburn. Negative for abdominal pain, blood in stool, constipation, diarrhea, nausea and vomiting.  Genitourinary: Negative.   Musculoskeletal: Positive for joint pain.  Skin: Negative.  Negative for rash.  Neurological: Positive for weakness. Negative for tingling, sensory change and headaches.  Psychiatric/Behavioral: The patient is nervous/anxious and has insomnia.     As per HPI. Otherwise, a complete review of systems is negative.  PAST MEDICAL  HISTORY: Past Medical History:  Diagnosis Date  . Asthma 2005  . Breast cancer ([place])   . CAD (coronary artery disease)   . Cancer [facility]) 2012   breast cancer, right-treated with neoadjunvant chemotherapy followed by partial mastectomy with sn bx and radiation. Pt is currently taking Tamoxifen since January 2013   . Hypertension 2012  . Lump or mass in breast 2012   right  . Malignant neoplasm of upper-outer quadrant of female breast ([place]) 2012   right  . MI (myocardial infarction) ([place])   . Myocardial infarction ([place]) 10/2012  . Personal history of tobacco use, presenting hazards to health     PAST SURGICAL HISTORY: Past Surgical History:  Procedure Laterality Date  . BREAST LUMPECTOMY Right 2012  . CESAREAN SECTION  [id number]  . CORONARY ANGIOPLASTY WITH STENT PLACEMENT  20123   Myocardial infarction  . FINGER SURGERY Left 1986   reconstruction, left pinky  . LEFT HEART CATHETERIZATION WITH CORONARY ANGIOGRAM N/A 10/16/2012   Procedure: LEFT HEART CATHETERIZATION WITH CORONARY ANGIOGRAM;  Surgeon: Clent Demark, MD;  Location: [facility] CATH LAB;  Service: Cardiovascular;  Laterality: N/A;  . PORTACATH PLACEMENT    . TUBAL LIGATION  2004  . WRIST SURGERY Left 1987    FAMILY HISTORY Family History  Problem Relation Age of Onset  . Cancer Maternal Grandmother 69       breast       ADVANCED DIRECTIVES:    HEALTH MAINTENANCE: Social History  Substance Use Topics  . Smoking status: Current Every Day Smoker    Packs/day: 0.50    Years: 15.00    Types: Cigarettes  . Smokeless tobacco: Never Used  . Alcohol use No     Allergies  Allergen  Reactions  . Codeine Nausea And Vomiting  . Penicillins Rash    Current Outpatient Prescriptions  Medication Sig Dispense Refill  . albuterol (PROVENTIL HFA;VENTOLIN HFA) 108 (90 Base) MCG/ACT inhaler Inhale 2 puffs into the lungs every 6 (six) hours as needed for wheezing or shortness of breath. 1 Inhaler 0  . aspirin EC 81 MG EC  tablet Take 1 tablet (81 mg total) by mouth daily. 30 tablet 3  . letrozole (FEMARA) 2.5 MG tablet Take 1 tablet (2.5 mg total) by mouth daily. 90 tablet 3  . nitroGLYCERIN (NITROSTAT) 0.4 MG SL tablet Place 1 tablet (0.4 mg total) under the tongue every 5 (five) minutes x 3 doses as needed for chest pain. 30 tablet 0  . oxyCODONE-acetaminophen (PERCOCET/ROXICET) 5-325 MG tablet Take 1 tablet by mouth at bedtime as needed for severe pain. 30 tablet 0  . prochlorperazine (COMPAZINE) 10 MG tablet Take 1 tablet (10 mg total) by mouth every 6 (six) hours as needed for nausea or vomiting. 30 tablet 0  . omeprazole (PRILOSEC) 20 MG capsule Take 1 capsule (20 mg total) by mouth daily. 30 capsule 3  . potassium chloride SA (K-DUR,KLOR-CON) 20 MEQ tablet Take 1 tablet (20 mEq total) by mouth 2 (two) times daily. (Patient not taking: Reported on 12/10/2016) 60 tablet 3   No current facility-administered medications for this visit.    Facility-Administered Medications Ordered in Other Visits  Medication Dose Route Frequency Provider Last Rate Last Dose  . sodium chloride flush (NS) 0.9 % injection 10 mL  10 mL Intravenous PRN Lloyd Huger, MD   10 mL at 11/05/16 1035    OBJECTIVE: Vitals:   03/18/17 1114  BP: 118/86  Pulse: (!) 101  Resp: 18  Temp: 97.8 F (36.6 C)     Body mass index is 19.56 kg/m.    ECOG FS:1 - Symptomatic but completely ambulatory  General: Well-developed, well-nourished, no acute distress. Eyes: anicteric sclera. Breasts: Exam deferred today. Lungs: Clear to auscultation bilaterally. Heart: Regular rate and rhythm. No rubs, murmurs, or gallops. Abdomen: Firm, distended, nontender.  Musculoskeletal: Minimal right shoulder mobility secondary to pain.  Neuro: Alert, answering all questions appropriately.  Skin: No rashes or petechiae noted. Psych: Normal affect.   LAB RESULTS:  Lab Results  Component Value Date   NA 134 (L) 03/09/2017   K 3.9 03/09/2017   CL  101 03/09/2017   CO2 24 03/09/2017   GLUCOSE 87 03/09/2017   BUN 9 03/09/2017   CREATININE 0.51 03/09/2017   CALCIUM 8.2 (L) 03/09/2017   PROT 6.7 03/09/2017   ALBUMIN 3.3 (L) 03/09/2017   AST 21 03/09/2017   ALT 10 (L) 03/09/2017   ALKPHOS 92 03/09/2017   BILITOT 0.2 (L) 03/09/2017   GFRNONAA >60 03/09/2017   GFRAA >60 03/09/2017    Lab Results  Component Value Date   WBC 5.6 03/09/2017   NEUTROABS 3.9 03/09/2017   HGB 11.7 (L) 03/09/2017   HCT 34.6 (L) 03/09/2017   MCV 87.8 03/09/2017   PLT 353 03/09/2017   Lab Results  Component Value Date   LABCA2 116.7 (H) 03/09/2017   Lab Results  Component Value Date   CA125 80.5 (H) 03/09/2017    STUDIES: Ct Chest W Contrast  Result Date: 03/09/2017 CLINICAL DATA:  Bilateral breast cancer. Chronic weakness and fatigue. Shoulder pain. Abdominal swelling. EXAM: CT CHEST, ABDOMEN, AND PELVIS WITH CONTRAST TECHNIQUE: Multidetector CT imaging of the chest, abdomen and pelvis was performed following the standard protocol  during bolus administration of intravenous contrast. CONTRAST:  75 cc Isovue-300. COMPARISON:  CT chest abdomen pelvis 12/07/2016 and bone scan 09/01/2016. FINDINGS: CT CHEST FINDINGS Cardiovascular: Left subclavian Port-A-Cath terminates in the high right atrium. Left brachiocephalic vein is stenotic. Atherosclerotic calcification of the arterial vasculature, including coronary arteries. Heart size normal. No pericardial effusion. Mediastinum/Nodes: No pathologically enlarged mediastinal, hilar or axillary lymph nodes. Esophagus is grossly unremarkable. Lungs/Pleura: Moderate centrilobular emphysema. Lungs are otherwise clear. No pleural fluid. Debris is seen dependently in the lower trachea and right mainstem bronchus. Airway is otherwise unremarkable. Musculoskeletal: Multiple tiny sclerotic foci are again seen throughout the visualized osseous structures, similar. CT ABDOMEN PELVIS FINDINGS Hepatobiliary: Subcentimeter  low-attenuation lesions in the liver are subcentimeter in size, too small to characterize but stable. Liver and gallbladder are otherwise unremarkable. No biliary ductal dilatation. Pancreas: Negative. Spleen: 8 mm low-attenuation lesion with peripheral calcification in the anterior spleen is unchanged and may represent a hemangioma. Adrenals/Urinary Tract: Adrenal glands and kidneys are unremarkable. Ureters are decompressed. Bladder is low in volume. Stomach/Bowel: Stomach, small bowel, appendix and colon are unremarkable. Vascular/Lymphatic: Atherosclerotic calcification of the arterial vasculature without abdominal aortic aneurysm. No pathologically enlarged lymph nodes. Reproductive: Hysterectomy.  No adnexal mass. Other: Omental caking/ serosal implants are seen along the distal half of the transverse colon, as before. Large ascites, increased. Musculoskeletal: Tiny sclerotic lesions are seen throughout the visualized osseous structures, as before. IMPRESSION: 1. Large ascites, increased. Associated omental/serosal metastases along the transverse colon, as before. 2. Numerous sclerotic osseous foci, as before, worrisome for metastatic disease. 3. 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 4. Aortic atherosclerosis (ICD10-170.0). Coronary artery calcification. Electronically Signed   By: Lorin Picket M.D.   On: 03/09/2017 12:38   Ct Abdomen Pelvis W Contrast  Result Date: 03/09/2017 CLINICAL DATA:  Bilateral breast cancer. Chronic weakness and fatigue. Shoulder pain. Abdominal swelling. EXAM: CT CHEST, ABDOMEN, AND PELVIS WITH CONTRAST TECHNIQUE: Multidetector CT imaging of the chest, abdomen and pelvis was performed following the standard protocol during bolus administration of intravenous contrast. CONTRAST:  75 cc Isovue-300. COMPARISON:  CT chest abdomen pelvis 12/07/2016 and bone scan 09/01/2016. FINDINGS: CT CHEST FINDINGS Cardiovascular: Left  subclavian Port-A-Cath terminates in the high right atrium. Left brachiocephalic vein is stenotic. Atherosclerotic calcification of the arterial vasculature, including coronary arteries. Heart size normal. No pericardial effusion. Mediastinum/Nodes: No pathologically enlarged mediastinal, hilar or axillary lymph nodes. Esophagus is grossly unremarkable. Lungs/Pleura: Moderate centrilobular emphysema. Lungs are otherwise clear. No pleural fluid. Debris is seen dependently in the lower trachea and right mainstem bronchus. Airway is otherwise unremarkable. Musculoskeletal: Multiple tiny sclerotic foci are again seen throughout the visualized osseous structures, similar. CT ABDOMEN PELVIS FINDINGS Hepatobiliary: Subcentimeter low-attenuation lesions in the liver are subcentimeter in size, too small to characterize but stable. Liver and gallbladder are otherwise unremarkable. No biliary ductal dilatation. Pancreas: Negative. Spleen: 8 mm low-attenuation lesion with peripheral calcification in the anterior spleen is unchanged and may represent a hemangioma. Adrenals/Urinary Tract: Adrenal glands and kidneys are unremarkable. Ureters are decompressed. Bladder is low in volume. Stomach/Bowel: Stomach, small bowel, appendix and colon are unremarkable. Vascular/Lymphatic: Atherosclerotic calcification of the arterial vasculature without abdominal aortic aneurysm. No pathologically enlarged lymph nodes. Reproductive: Hysterectomy.  No adnexal mass. Other: Omental caking/ serosal implants are seen along the distal half of the transverse colon, as before. Large ascites, increased. Musculoskeletal: Tiny sclerotic lesions are seen throughout the visualized osseous structures, as before. IMPRESSION: 1. Large ascites, increased.  Associated omental/serosal metastases along the transverse colon, as before. 2. Numerous sclerotic osseous foci, as before, worrisome for metastatic disease. 3. Stenotic left brachiocephalic vein with  indwelling catheter and associated collateral flow. 4. Aortic atherosclerosis (ICD10-170.0). Coronary artery calcification. Electronically Signed   By: Lorin Picket M.D.   On: 03/09/2017 12:38    ASSESSMENT: Stage IV left upper outer quadrant lobular breast cancer with ovarian and abdominal metastasis. ER/PR positive, HER-2/neu not overexpressing.  PLAN:    1. 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 ER/PR positive, HER-2/neu not overexpressing:  Imaging results reviewed independently and reported as above with what appears to be stable disease. Her ascites has increased. CA-27-29 continues to trend up slowly. C1 25 appears to be erratic. Once again discussed at length reinitiating treatment, likely using Ibrance, but patient wishes to defer and repeat imaging in 3 months. She has acknowledged that she will likely need to restart treatment in the near future. Will not use Taxotere given her difficulties with recent treatments. Continue letrozole as prescribed. Return to clinic every 4 weeks for laboratory work and Zometa only. Patient will then return to clinic in three months for repeat imaging and further evaluation.   2. Right shoulder pain: Continue evaluation and treatment per primary care.   3. Ascites: Repeat ultrasound and paracentesis was offered, but patient wishes to defer at this time. She will call clinic if she wishes to proceed with an intervention.  Monitor.  Approximately 30 minutes was spent in discussion of which greater than 50% was consultation.  Patient expressed understanding and was in agreement with this plan. She also understands that She can call clinic at any time with any questions, concerns, or complaints.    Lloyd Huger, MD 03/20/17 7:40 AM

## 2017-03-18 ENCOUNTER — Inpatient Hospital Stay (HOSPITAL_BASED_OUTPATIENT_CLINIC_OR_DEPARTMENT_OTHER): Payer: Medicaid Other | Admitting: Oncology

## 2017-03-18 VITALS — BP 118/86 | HR 101 | Temp 97.8°F | Resp 18 | Wt 106.9 lb

## 2017-03-18 DIAGNOSIS — M25511 Pain in right shoulder: Secondary | ICD-10-CM

## 2017-03-18 DIAGNOSIS — Z79811 Long term (current) use of aromatase inhibitors: Secondary | ICD-10-CM

## 2017-03-18 DIAGNOSIS — Z923 Personal history of irradiation: Secondary | ICD-10-CM | POA: Diagnosis not present

## 2017-03-18 DIAGNOSIS — Z170 Estrogen receptor positive status [ER+]: Secondary | ICD-10-CM

## 2017-03-18 DIAGNOSIS — R188 Other ascites: Secondary | ICD-10-CM

## 2017-03-18 DIAGNOSIS — C7989 Secondary malignant neoplasm of other specified sites: Secondary | ICD-10-CM

## 2017-03-18 DIAGNOSIS — Z9221 Personal history of antineoplastic chemotherapy: Secondary | ICD-10-CM | POA: Diagnosis not present

## 2017-03-18 DIAGNOSIS — C50412 Malignant neoplasm of upper-outer quadrant of left female breast: Secondary | ICD-10-CM

## 2017-03-18 DIAGNOSIS — C50912 Malignant neoplasm of unspecified site of left female breast: Secondary | ICD-10-CM

## 2017-03-18 DIAGNOSIS — F17210 Nicotine dependence, cigarettes, uncomplicated: Secondary | ICD-10-CM | POA: Diagnosis not present

## 2017-03-18 MED ORDER — OMEPRAZOLE 20 MG PO CPDR: 20.0000 mg | DELAYED_RELEASE_CAPSULE | Freq: Every day | ORAL | 3 refills | Status: DC

## 2017-03-18 NOTE — Progress Notes (Signed)
Patient states her abdomen is full of fluid again making it hard to sleep without her head being inclined.  Also interferes with her eating because after a few bites she feels full.  Patient requesting refill for omeprazole.

## 2017-03-23 ENCOUNTER — Inpatient Hospital Stay: Payer: Medicaid Other

## 2017-03-23 VITALS — BP 119/89 | HR 84 | Temp 96.0°F | Resp 18

## 2017-03-23 DIAGNOSIS — C50412 Malignant neoplasm of upper-outer quadrant of left female breast: Secondary | ICD-10-CM

## 2017-03-23 DIAGNOSIS — C50912 Malignant neoplasm of unspecified site of left female breast: Secondary | ICD-10-CM

## 2017-03-23 LAB — COMPREHENSIVE METABOLIC PANEL
ALBUMIN: 3 g/dL — AB (ref 3.5–5.0)
ALT: 9 U/L — AB (ref 14–54)
AST: 21 U/L (ref 15–41)
Alkaline Phosphatase: 87 U/L (ref 38–126)
Anion gap: 6 (ref 5–15)
BUN: 9 mg/dL (ref 6–20)
CHLORIDE: 102 mmol/L (ref 101–111)
CO2: 24 mmol/L (ref 22–32)
Calcium: 7.9 mg/dL — ABNORMAL LOW (ref 8.9–10.3)
Creatinine, Ser: 0.58 mg/dL (ref 0.44–1.00)
GFR calc Af Amer: >60 mL/min (ref >60)
GLUCOSE: 101 mg/dL — AB (ref 65–99)
POTASSIUM: 3.7 mmol/L (ref 3.5–5.1)
Sodium: 132 mmol/L — ABNORMAL LOW (ref 135–145)
Total Bilirubin: 0.5 mg/dL (ref 0.3–1.2)
Total Protein: 6.5 g/dL (ref 6.5–8.1)

## 2017-03-23 MED ORDER — SODIUM CHLORIDE 0.9 % IV SOLN
Freq: Once | INTRAVENOUS | Status: AC
  Administered 2017-03-23: 11:00:00 via INTRAVENOUS
  Filled 2017-03-23: qty 1000

## 2017-03-23 MED ORDER — SODIUM CHLORIDE 0.9% FLUSH
10.0000 mL | INTRAVENOUS | Status: DC | PRN
  Administered 2017-03-23: 10 mL
  Filled 2017-03-23: qty 10

## 2017-03-23 MED ORDER — ZOLEDRONIC ACID 4 MG/100ML IV SOLN
4.0000 mg | Freq: Once | INTRAVENOUS | Status: AC
  Administered 2017-03-23: 4 mg via INTRAVENOUS
  Filled 2017-03-23: qty 100

## 2017-03-23 MED ORDER — HEPARIN SOD (PORK) LOCK FLUSH 100 UNIT/ML IV SOLN
500.0000 [IU] | Freq: Once | INTRAVENOUS | Status: DC | PRN
  Filled 2017-03-23: qty 5

## 2017-03-23 NOTE — Patient Instructions (Signed)

## 2017-03-24 LAB — CA 125: CA 125: 86.3 U/mL — ABNORMAL HIGH (ref 0.0–38.1)

## 2017-03-24 LAB — CANCER ANTIGEN 27.29: CA 27.29: 112.3 U/mL — ABNORMAL HIGH (ref 0.0–38.6)

## 2017-04-22 ENCOUNTER — Other Ambulatory Visit: Payer: Medicaid Other

## 2017-04-22 ENCOUNTER — Ambulatory Visit: Payer: Medicaid Other

## 2017-04-29 ENCOUNTER — Other Ambulatory Visit: Payer: Self-pay | Admitting: *Deleted

## 2017-04-29 ENCOUNTER — Inpatient Hospital Stay: Payer: Medicaid Other

## 2017-04-29 ENCOUNTER — Inpatient Hospital Stay: Payer: Medicaid Other | Attending: Oncology

## 2017-04-29 VITALS — BP 126/88 | HR 99 | Temp 96.2°F | Resp 18

## 2017-04-29 DIAGNOSIS — I10 Essential (primary) hypertension: Secondary | ICD-10-CM | POA: Diagnosis not present

## 2017-04-29 DIAGNOSIS — C50912 Malignant neoplasm of unspecified site of left female breast: Secondary | ICD-10-CM

## 2017-04-29 DIAGNOSIS — K219 Gastro-esophageal reflux disease without esophagitis: Secondary | ICD-10-CM | POA: Insufficient documentation

## 2017-04-29 DIAGNOSIS — Z90710 Acquired absence of both cervix and uterus: Secondary | ICD-10-CM | POA: Diagnosis not present

## 2017-04-29 DIAGNOSIS — M25511 Pain in right shoulder: Secondary | ICD-10-CM | POA: Insufficient documentation

## 2017-04-29 DIAGNOSIS — R188 Other ascites: Secondary | ICD-10-CM | POA: Diagnosis not present

## 2017-04-29 DIAGNOSIS — Z955 Presence of coronary angioplasty implant and graft: Secondary | ICD-10-CM | POA: Insufficient documentation

## 2017-04-29 DIAGNOSIS — Z170 Estrogen receptor positive status [ER+]: Secondary | ICD-10-CM | POA: Diagnosis not present

## 2017-04-29 DIAGNOSIS — Z7982 Long term (current) use of aspirin: Secondary | ICD-10-CM | POA: Diagnosis not present

## 2017-04-29 DIAGNOSIS — Z79811 Long term (current) use of aromatase inhibitors: Secondary | ICD-10-CM | POA: Diagnosis not present

## 2017-04-29 DIAGNOSIS — I252 Old myocardial infarction: Secondary | ICD-10-CM | POA: Diagnosis not present

## 2017-04-29 DIAGNOSIS — Z923 Personal history of irradiation: Secondary | ICD-10-CM | POA: Diagnosis not present

## 2017-04-29 DIAGNOSIS — F17210 Nicotine dependence, cigarettes, uncomplicated: Secondary | ICD-10-CM | POA: Diagnosis not present

## 2017-04-29 DIAGNOSIS — C50919 Malignant neoplasm of unspecified site of unspecified female breast: Secondary | ICD-10-CM

## 2017-04-29 DIAGNOSIS — J45909 Unspecified asthma, uncomplicated: Secondary | ICD-10-CM | POA: Insufficient documentation

## 2017-04-29 DIAGNOSIS — C50412 Malignant neoplasm of upper-outer quadrant of left female breast: Secondary | ICD-10-CM | POA: Insufficient documentation

## 2017-04-29 DIAGNOSIS — Z9221 Personal history of antineoplastic chemotherapy: Secondary | ICD-10-CM | POA: Diagnosis not present

## 2017-04-29 DIAGNOSIS — I2510 Atherosclerotic heart disease of native coronary artery without angina pectoris: Secondary | ICD-10-CM | POA: Diagnosis not present

## 2017-04-29 DIAGNOSIS — Z79899 Other long term (current) drug therapy: Secondary | ICD-10-CM | POA: Insufficient documentation

## 2017-04-29 DIAGNOSIS — C7989 Secondary malignant neoplasm of other specified sites: Secondary | ICD-10-CM | POA: Diagnosis not present

## 2017-04-29 LAB — BASIC METABOLIC PANEL
Anion gap: 10 (ref 5–15)
BUN: 8 mg/dL (ref 6–20)
CALCIUM: 8.4 mg/dL — AB (ref 8.9–10.3)
CO2: 24 mmol/L (ref 22–32)
CREATININE: 0.64 mg/dL (ref 0.44–1.00)
Chloride: 101 mmol/L (ref 101–111)
GFR calc Af Amer: >60 mL/min (ref >60)
GFR calc non Af Amer: >60 mL/min (ref >60)
GLUCOSE: 124 mg/dL — AB (ref 65–99)
Potassium: 3.2 mmol/L — ABNORMAL LOW (ref 3.5–5.1)
Sodium: 135 mmol/L (ref 135–145)

## 2017-04-29 MED ORDER — SODIUM CHLORIDE 0.9% FLUSH
10.0000 mL | INTRAVENOUS | Status: DC | PRN
  Administered 2017-04-29: 10 mL
  Filled 2017-04-29: qty 10

## 2017-04-29 MED ORDER — SODIUM CHLORIDE 0.9 % IV SOLN
Freq: Once | INTRAVENOUS | Status: AC
  Administered 2017-04-29: 10:00:00 via INTRAVENOUS
  Filled 2017-04-29: qty 1000

## 2017-04-29 MED ORDER — ZOLEDRONIC ACID 4 MG/100ML IV SOLN
4.0000 mg | Freq: Once | INTRAVENOUS | Status: AC
  Administered 2017-04-29: 4 mg via INTRAVENOUS
  Filled 2017-04-29: qty 100

## 2017-04-29 MED ORDER — HEPARIN SOD (PORK) LOCK FLUSH 100 UNIT/ML IV SOLN
500.0000 [IU] | Freq: Once | INTRAVENOUS | Status: AC | PRN
Start: 2017-04-29 — End: 2017-04-29
  Administered 2017-04-29: 500 [IU]
  Filled 2017-04-29: qty 5

## 2017-05-20 ENCOUNTER — Inpatient Hospital Stay: Payer: Medicaid Other | Attending: Oncology

## 2017-05-20 ENCOUNTER — Inpatient Hospital Stay: Payer: Medicaid Other

## 2017-05-20 DIAGNOSIS — C786 Secondary malignant neoplasm of retroperitoneum and peritoneum: Secondary | ICD-10-CM | POA: Diagnosis not present

## 2017-05-20 DIAGNOSIS — C50412 Malignant neoplasm of upper-outer quadrant of left female breast: Secondary | ICD-10-CM | POA: Insufficient documentation

## 2017-05-20 DIAGNOSIS — Z170 Estrogen receptor positive status [ER+]: Secondary | ICD-10-CM | POA: Insufficient documentation

## 2017-05-20 DIAGNOSIS — C50912 Malignant neoplasm of unspecified site of left female breast: Secondary | ICD-10-CM

## 2017-05-20 LAB — BASIC METABOLIC PANEL
ANION GAP: 9 (ref 5–15)
BUN: 9 mg/dL (ref 6–20)
CHLORIDE: 100 mmol/L — AB (ref 101–111)
CO2: 24 mmol/L (ref 22–32)
Calcium: 7.8 mg/dL — ABNORMAL LOW (ref 8.9–10.3)
Creatinine, Ser: 0.53 mg/dL (ref 0.44–1.00)
GFR calc non Af Amer: >60 mL/min (ref >60)
Glucose, Bld: 109 mg/dL — ABNORMAL HIGH (ref 65–99)
POTASSIUM: 3.5 mmol/L (ref 3.5–5.1)
SODIUM: 133 mmol/L — AB (ref 135–145)

## 2017-05-20 NOTE — Progress Notes (Signed)
Pt has scheduled appt for today for labs and zometa. Labs were drawn but after review patient was in clinic early for her zometa treatment.  Pt appts were rescheduled and pt will return for zometa on July 27th.

## 2017-06-01 LAB — CA 125: CANCER ANTIGEN (CA) 125: 92 U/mL — AB (ref 0.0–38.1)

## 2017-06-01 LAB — CANCER ANTIGEN 27.29: CA 27.29: 134.6 U/mL — ABNORMAL HIGH (ref 0.0–38.6)

## 2017-06-03 ENCOUNTER — Ambulatory Visit
Admission: RE | Admit: 2017-06-03 | Discharge: 2017-06-03 | Disposition: A | Discharge status: Home / Self Care | Payer: Medicaid Other | Source: Ambulatory Visit | Attending: Oncology | Admitting: Oncology

## 2017-06-03 ENCOUNTER — Inpatient Hospital Stay: Payer: Medicaid Other

## 2017-06-03 ENCOUNTER — Other Ambulatory Visit: Payer: Self-pay | Admitting: *Deleted

## 2017-06-03 DIAGNOSIS — R0602 Shortness of breath: Secondary | ICD-10-CM | POA: Insufficient documentation

## 2017-06-03 DIAGNOSIS — C50912 Malignant neoplasm of unspecified site of left female breast: Secondary | ICD-10-CM

## 2017-06-03 DIAGNOSIS — R918 Other nonspecific abnormal finding of lung field: Secondary | ICD-10-CM | POA: Diagnosis not present

## 2017-06-03 DIAGNOSIS — Z170 Estrogen receptor positive status [ER+]: Principal | ICD-10-CM

## 2017-06-03 DIAGNOSIS — C50412 Malignant neoplasm of upper-outer quadrant of left female breast: Secondary | ICD-10-CM | POA: Diagnosis not present

## 2017-06-03 LAB — BASIC METABOLIC PANEL
Anion gap: 9 (ref 5–15)
BUN: 9 mg/dL (ref 6–20)
CALCIUM: 8.1 mg/dL — AB (ref 8.9–10.3)
CHLORIDE: 100 mmol/L — AB (ref 101–111)
CO2: 24 mmol/L (ref 22–32)
Creatinine, Ser: 0.64 mg/dL (ref 0.44–1.00)
GFR calc non Af Amer: >60 mL/min (ref >60)
GLUCOSE: 112 mg/dL — AB (ref 65–99)
POTASSIUM: 3.3 mmol/L — AB (ref 3.5–5.1)
Sodium: 133 mmol/L — ABNORMAL LOW (ref 135–145)

## 2017-06-03 MED ORDER — SODIUM CHLORIDE 0.9 % IV SOLN
Freq: Once | INTRAVENOUS | Status: AC
  Administered 2017-06-03: 11:00:00 via INTRAVENOUS
  Filled 2017-06-03: qty 1000

## 2017-06-03 MED ORDER — SODIUM CHLORIDE 0.9% FLUSH
10.0000 mL | INTRAVENOUS | Status: DC | PRN
  Administered 2017-06-03: 10 mL via INTRAVENOUS
  Filled 2017-06-03: qty 10

## 2017-06-03 MED ORDER — HEPARIN SOD (PORK) LOCK FLUSH 100 UNIT/ML IV SOLN
500.0000 [IU] | Freq: Once | INTRAVENOUS | Status: AC
  Administered 2017-06-03: 500 [IU] via INTRAVENOUS

## 2017-06-03 MED ORDER — ZOLEDRONIC ACID 4 MG/100ML IV SOLN
4.0000 mg | Freq: Once | INTRAVENOUS | Status: AC
  Administered 2017-06-03: 4 mg via INTRAVENOUS

## 2017-06-06 ENCOUNTER — Telehealth: Payer: Self-pay | Admitting: *Deleted

## 2017-06-06 NOTE — Telephone Encounter (Signed)
Patient notified of chest xray results from Friday 7/27, per Dr. Grayland Ormond patient does not need thoracentesis at this time. Patient verbalized understanding of results.

## 2017-06-13 ENCOUNTER — Inpatient Hospital Stay: Payer: Medicaid Other | Attending: Oncology

## 2017-06-13 ENCOUNTER — Inpatient Hospital Stay: Payer: Medicaid Other

## 2017-06-13 ENCOUNTER — Ambulatory Visit
Admission: RE | Admit: 2017-06-13 | Discharge: 2017-06-13 | Disposition: A | Discharge status: Home / Self Care | Payer: Medicaid Other | Source: Ambulatory Visit | Attending: Oncology | Admitting: Oncology

## 2017-06-13 ENCOUNTER — Other Ambulatory Visit: Payer: Self-pay | Admitting: Oncology

## 2017-06-13 DIAGNOSIS — Z79811 Long term (current) use of aromatase inhibitors: Secondary | ICD-10-CM | POA: Insufficient documentation

## 2017-06-13 DIAGNOSIS — Z955 Presence of coronary angioplasty implant and graft: Secondary | ICD-10-CM | POA: Diagnosis not present

## 2017-06-13 DIAGNOSIS — J90 Pleural effusion, not elsewhere classified: Secondary | ICD-10-CM | POA: Insufficient documentation

## 2017-06-13 DIAGNOSIS — R630 Anorexia: Secondary | ICD-10-CM | POA: Diagnosis not present

## 2017-06-13 DIAGNOSIS — R197 Diarrhea, unspecified: Secondary | ICD-10-CM | POA: Insufficient documentation

## 2017-06-13 DIAGNOSIS — R112 Nausea with vomiting, unspecified: Secondary | ICD-10-CM | POA: Insufficient documentation

## 2017-06-13 DIAGNOSIS — C50912 Malignant neoplasm of unspecified site of left female breast: Secondary | ICD-10-CM

## 2017-06-13 DIAGNOSIS — I700 Atherosclerosis of aorta: Secondary | ICD-10-CM | POA: Diagnosis not present

## 2017-06-13 DIAGNOSIS — J45909 Unspecified asthma, uncomplicated: Secondary | ICD-10-CM | POA: Diagnosis not present

## 2017-06-13 DIAGNOSIS — Z7982 Long term (current) use of aspirin: Secondary | ICD-10-CM | POA: Diagnosis not present

## 2017-06-13 DIAGNOSIS — R180 Malignant ascites: Secondary | ICD-10-CM | POA: Diagnosis not present

## 2017-06-13 DIAGNOSIS — C786 Secondary malignant neoplasm of retroperitoneum and peritoneum: Secondary | ICD-10-CM | POA: Insufficient documentation

## 2017-06-13 DIAGNOSIS — I252 Old myocardial infarction: Secondary | ICD-10-CM | POA: Insufficient documentation

## 2017-06-13 DIAGNOSIS — M25511 Pain in right shoulder: Secondary | ICD-10-CM | POA: Insufficient documentation

## 2017-06-13 DIAGNOSIS — F17210 Nicotine dependence, cigarettes, uncomplicated: Secondary | ICD-10-CM | POA: Insufficient documentation

## 2017-06-13 DIAGNOSIS — C7951 Secondary malignant neoplasm of bone: Secondary | ICD-10-CM | POA: Diagnosis not present

## 2017-06-13 DIAGNOSIS — K219 Gastro-esophageal reflux disease without esophagitis: Secondary | ICD-10-CM | POA: Diagnosis not present

## 2017-06-13 DIAGNOSIS — Z79899 Other long term (current) drug therapy: Secondary | ICD-10-CM | POA: Insufficient documentation

## 2017-06-13 DIAGNOSIS — Z170 Estrogen receptor positive status [ER+]: Secondary | ICD-10-CM | POA: Insufficient documentation

## 2017-06-13 DIAGNOSIS — I10 Essential (primary) hypertension: Secondary | ICD-10-CM | POA: Insufficient documentation

## 2017-06-13 DIAGNOSIS — Z9011 Acquired absence of right breast and nipple: Secondary | ICD-10-CM | POA: Diagnosis not present

## 2017-06-13 DIAGNOSIS — C50412 Malignant neoplasm of upper-outer quadrant of left female breast: Secondary | ICD-10-CM | POA: Insufficient documentation

## 2017-06-13 DIAGNOSIS — Z95828 Presence of other vascular implants and grafts: Secondary | ICD-10-CM

## 2017-06-13 DIAGNOSIS — I2510 Atherosclerotic heart disease of native coronary artery without angina pectoris: Secondary | ICD-10-CM | POA: Diagnosis not present

## 2017-06-13 LAB — BASIC METABOLIC PANEL
Anion gap: 7 (ref 5–15)
BUN: 8 mg/dL (ref 6–20)
CALCIUM: 7.8 mg/dL — AB (ref 8.9–10.3)
CO2: 23 mmol/L (ref 22–32)
CREATININE: 0.55 mg/dL (ref 0.44–1.00)
Chloride: 102 mmol/L (ref 101–111)
GFR calc Af Amer: >60 mL/min (ref >60)
GFR calc non Af Amer: >60 mL/min (ref >60)
GLUCOSE: 87 mg/dL (ref 65–99)
Potassium: 3.6 mmol/L (ref 3.5–5.1)
Sodium: 132 mmol/L — ABNORMAL LOW (ref 135–145)

## 2017-06-13 MED ORDER — HEPARIN SOD (PORK) LOCK FLUSH 100 UNIT/ML IV SOLN
500.0000 [IU] | Freq: Once | INTRAVENOUS | Status: AC
  Administered 2017-06-13: 500 [IU] via INTRAVENOUS

## 2017-06-13 MED ORDER — SODIUM CHLORIDE 0.9% FLUSH
10.0000 mL | INTRAVENOUS | Status: DC | PRN
  Administered 2017-06-13: 10 mL via INTRAVENOUS
  Filled 2017-06-13: qty 10

## 2017-06-17 ENCOUNTER — Ambulatory Visit: Payer: Medicaid Other | Admitting: Oncology

## 2017-06-17 ENCOUNTER — Ambulatory Visit: Payer: Medicaid Other

## 2017-06-21 ENCOUNTER — Ambulatory Visit
Admission: RE | Admit: 2017-06-21 | Discharge: 2017-06-21 | Disposition: A | Discharge status: Home / Self Care | Payer: Medicaid Other | Source: Ambulatory Visit | Attending: Oncology | Admitting: Oncology

## 2017-06-21 DIAGNOSIS — C50912 Malignant neoplasm of unspecified site of left female breast: Secondary | ICD-10-CM | POA: Insufficient documentation

## 2017-06-21 DIAGNOSIS — C7951 Secondary malignant neoplasm of bone: Secondary | ICD-10-CM | POA: Diagnosis not present

## 2017-06-21 DIAGNOSIS — J439 Emphysema, unspecified: Secondary | ICD-10-CM | POA: Diagnosis not present

## 2017-06-21 DIAGNOSIS — I700 Atherosclerosis of aorta: Secondary | ICD-10-CM | POA: Insufficient documentation

## 2017-06-21 DIAGNOSIS — J90 Pleural effusion, not elsewhere classified: Secondary | ICD-10-CM | POA: Diagnosis not present

## 2017-06-21 MED ORDER — IOPAMIDOL (ISOVUE-300) INJECTION 61%
100.0000 mL | Freq: Once | INTRAVENOUS | Status: AC | PRN
  Administered 2017-06-21: 100 mL via INTRAVENOUS

## 2017-06-24 ENCOUNTER — Ambulatory Visit: Payer: Medicaid Other

## 2017-06-24 ENCOUNTER — Ambulatory Visit: Payer: Medicaid Other | Admitting: Oncology

## 2017-06-28 NOTE — Progress Notes (Signed)
[place]  [phone]) [phone] [phone]) [phone]  ID: [name] OB: 12-11-1970  MR#: [id number]  PJP#:[id number]  Patient Care Team: [facility] as PCP - General  CHIEF COMPLAINT: Stage IV left upper outer quadrant lobular breast cancer with ovarian and abdominal metastasis. ER/PR positive, HER-2/neu not overexpressing.  INTERVAL HISTORY: Patient returns to clinic today for further evaluation, laboratory work, and discussion of her imaging results. She continues to have chronic weakness and fatigue. She has persistent nausea, a poor appetite, and weight loss. She also complains of persistent diarrhea. Her abdomen is swollen and feels tight which contributes to increased shortness of breath.  She reports acid reflux. She continues to be highly anxious.  She has no neurologic complaints. She denies any fevers, chills, or recent illnesses. She denies chest pain. She has no urinary complaints. She denies hematuria and hematochezia.  She reports difficulty getting to sleep.  Patient offers no further specific complaints.  REVIEW OF SYSTEMS:   Review of Systems  Constitutional: Positive for malaise/fatigue and weight loss. Negative for fever.  Eyes: Negative.   Respiratory: Positive for shortness of breath. Negative for cough.   Cardiovascular: Negative.  Negative for chest pain and leg swelling.  Gastrointestinal: Positive for abdominal pain, diarrhea, heartburn and nausea. Negative for blood in stool, constipation and vomiting.  Genitourinary: Negative.   Musculoskeletal: Negative for joint pain.  Skin: Negative.  Negative for rash.  Neurological: Positive for weakness. Negative for tingling, sensory change and headaches.  Psychiatric/Behavioral: The patient is nervous/anxious and has insomnia.     As per HPI. Otherwise, a complete review of systems is negative.  PAST MEDICAL HISTORY: Past Medical History:  Diagnosis Date  . Asthma  2005  . Breast cancer ([place])   . CAD (coronary artery disease)   . Cancer [facility]) 2012   breast cancer, right-treated with neoadjunvant chemotherapy followed by partial mastectomy with sn bx and radiation. Pt is currently taking Tamoxifen since January 2013   . Hypertension 2012  . Lump or mass in breast 2012   right  . Malignant neoplasm of upper-outer quadrant of female breast ([place]) 2012   right  . MI (myocardial infarction) ([place])   . Myocardial infarction ([place]) 10/2012  . Personal history of tobacco use, presenting hazards to health     PAST SURGICAL HISTORY: Past Surgical History:  Procedure Laterality Date  . BREAST LUMPECTOMY Right 2012  . CESAREAN SECTION  [id number]  . CORONARY ANGIOPLASTY WITH STENT PLACEMENT  20123   Myocardial infarction  . FINGER SURGERY Left 1986   reconstruction, left pinky  . LEFT HEART CATHETERIZATION WITH CORONARY ANGIOGRAM N/A 10/16/2012   Procedure: LEFT HEART CATHETERIZATION WITH CORONARY ANGIOGRAM;  Surgeon: Clent Demark, MD;  Location: [facility];  Service: Cardiovascular;  Laterality: N/A;  . PORTACATH PLACEMENT    . TUBAL LIGATION  2004  . WRIST SURGERY Left 1987    FAMILY HISTORY Family History  Problem Relation Age of Onset  . Cancer Maternal Grandmother 69       breast       ADVANCED DIRECTIVES:    HEALTH MAINTENANCE: Social History  Substance Use Topics  . Smoking status: Current Every Day Smoker    Packs/day: 0.50    Years: 15.00    Types: Cigarettes  . Smokeless tobacco: Never Used  . Alcohol use No     Allergies  Allergen Reactions  . Codeine Nausea And Vomiting  . Penicillins Rash  Current Outpatient Prescriptions  Medication Sig Dispense Refill  . albuterol (PROVENTIL HFA;VENTOLIN HFA) 108 (90 Base) MCG/ACT inhaler Inhale 2 puffs into the lungs every 6 (six) hours as needed for wheezing or shortness of breath. 1 Inhaler 0  . aspirin EC 81 MG EC tablet Take 1 tablet (81 mg total) by mouth daily. 30 tablet  3  . nitroGLYCERIN (NITROSTAT) 0.4 MG SL tablet Place 1 tablet (0.4 mg total) under the tongue every 5 (five) minutes x 3 doses as needed for chest pain. 30 tablet 0  . oxyCODONE-acetaminophen (PERCOCET/ROXICET) 5-325 MG tablet Take 1 tablet by mouth at bedtime as needed for severe pain. 30 tablet 0  . potassium chloride SA (K-DUR,KLOR-CON) 20 MEQ tablet Take 1 tablet (20 mEq total) by mouth 2 (two) times daily. 60 tablet 3  . prochlorperazine (COMPAZINE) 10 MG tablet Take 1 tablet (10 mg total) by mouth every 6 (six) hours as needed for nausea or vomiting. 30 tablet 0  . dexamethasone (DECADRON) 4 MG tablet Take 1 tablet (4 mg total) by mouth daily. 30 tablet 0  . letrozole (FEMARA) 2.5 MG tablet Take 1 tablet (2.5 mg total) by mouth daily. (Patient not taking: Reported on 07/01/2017) 90 tablet 3  . ondansetron (ZOFRAN ODT) 8 MG disintegrating tablet Take 1 tablet (8 mg total) by mouth every 8 (eight) hours as needed for nausea or vomiting. 20 tablet 0   No current facility-administered medications for this visit.    Facility-Administered Medications Ordered in Other Visits  Medication Dose Route Frequency Provider Last Rate Last Dose  . 0.9 %  sodium chloride infusion   Intravenous Once Lloyd Huger, MD      . heparin lock flush 100 unit/mL  500 Units Intracatheter Once PRN Lloyd Huger, MD      . sodium chloride flush (NS) 0.9 % injection 10 mL  10 mL Intravenous PRN Lloyd Huger, MD   10 mL at 11/05/16 1035  . sodium chloride flush (NS) 0.9 % injection 10 mL  10 mL Intracatheter PRN Lloyd Huger, MD   10 mL at 07/01/17 1120    OBJECTIVE: Vitals:   07/01/17 1137  BP: (!) 130/96  Pulse: (!) 101  Resp: 18  Temp: 98.6 F (37 C)     Body mass index is 18.56 kg/m.    ECOG FS:2 - Symptomatic, <50% confined to bed  General: Ill-appearing, no acute distress. Eyes: anicteric sclera. Breasts: Exam deferred today. Lungs: Clear to auscultation bilaterally. Heart:  Regular rate and rhythm. No rubs, murmurs, or gallops. Abdomen: Firm, distended, nontender.  Musculoskeletal: Minimal right shoulder mobility secondary to pain.  Neuro: Alert, answering all questions appropriately.  Skin: No rashes or petechiae noted. Psych: Normal affect.   LAB RESULTS:  Lab Results  Component Value Date   NA 131 (L) 07/01/2017   K 3.0 (L) 07/01/2017   CL 98 (L) 07/01/2017   CO2 25 07/01/2017   GLUCOSE 127 (H) 07/01/2017   BUN 10 07/01/2017   CREATININE 0.58 07/01/2017   CALCIUM 8.1 (L) 07/01/2017   PROT 6.7 07/01/2017   ALBUMIN 3.3 (L) 07/01/2017   AST 22 07/01/2017   ALT 7 (L) 07/01/2017   ALKPHOS 74 07/01/2017   BILITOT 0.7 07/01/2017   GFRNONAA >60 07/01/2017   GFRAA >60 07/01/2017    Lab Results  Component Value Date   WBC 5.6 03/09/2017   NEUTROABS 3.9 03/09/2017   HGB 11.7 (L) 03/09/2017   HCT 34.6 (L) 03/09/2017  MCV 87.8 03/09/2017   PLT 353 03/09/2017   Lab Results  Component Value Date   LABCA2 112.3 (H) 03/23/2017   Lab Results  Component Value Date   CA125 86.3 (H) 03/23/2017    STUDIES: Dg Chest 2 View  Result Date: 06/03/2017 CLINICAL DATA:  Shortness of breath EXAM: CHEST  2 VIEW COMPARISON:  Chest CT 03/09/2017 FINDINGS: Left chest wall Port-A-Cath with tip in the right atrium. The lungs are hyperexpanded without focal consolidation or pulmonary edema. No pneumothorax or pleural effusion. Normal cardiomediastinal contours. There is a coronary artery stent. IMPRESSION: Hyperinflated lungs, consistent with COPD. No focal airspace disease. Electronically Signed   By: Ulyses Jarred M.D.   On: 06/03/2017 15:28   Ct Chest W Contrast  Result Date: 06/21/2017 CLINICAL DATA:  Shortness of breath and weakness. History of metastatic breast cancer. EXAM: CT CHEST, ABDOMEN, AND PELVIS WITH CONTRAST TECHNIQUE: Multidetector CT imaging of the chest, abdomen and pelvis was performed following the standard protocol during bolus administration  of intravenous contrast. CONTRAST:  146m ISOVUE-300 IOPAMIDOL (ISOVUE-300) INJECTION 61% COMPARISON:  CT 03/09/2017 FINDINGS: CT CHEST FINDINGS Cardiovascular: The heart is normal in size. No pericardial effusion. Stable mild fusiform enlargement of the ascending aorta but no aneurysmal dilatation. Stable coronary artery calcifications. Mediastinum/Nodes: No mediastinal or hilar mass or adenopathy. The esophagus is grossly normal. Lungs/Pleura: Stable emphysematous changes. No findings for pulmonary metastatic disease or acute infiltrate or edema. There is a small left pleural effusion with overlying atelectasis. No obvious enhancing pleural nodules. Chest wall/ Musculoskeletal: No obvious breast mass, supraclavicular or axillary adenopathy. Left-sided Port-A-Cath is stable. The thyroid gland is grossly normal. Stable sclerotic osseous metastatic disease. No pathologic fracture or spinal canal compromise. CT ABDOMEN PELVIS FINDINGS Hepatobiliary: Significant mass effect on the liver by a malignant abdominal ascites. The right and left hepatic lobes are compressed. No obvious peritoneal surface lesions or intraparenchymal metastasis. The gallbladder is normal. No common bile duct dilatation. Pancreas: No mass, inflammation or ductal dilatation. Spleen: Small splenic lesions are stable. Adrenals/Urinary Tract: The adrenal glands and kidneys are unremarkable and stable. Mild mass effect on both kidneys due to the large volume ascites and compression of the retroperitoneum. Stomach/Bowel: The stomach, duodenum, small bowel and colon are grossly normal. No obstructive findings are demonstrated. Persistence omental disease involving the transverse colon region along with the peritoneal surface disease involving the cecum and distal small bowel loops. Vascular/Lymphatic: Advanced atherosclerotic calcifications involving the aorta and iliac arteries but no aneurysm. No enlarged mesenteric or retroperitoneal lymph nodes.  Reproductive: No adnexal mass. Other: Large volume abdominal/ pelvic ascites similar to the prior examination. Musculoskeletal: Stable scattered sclerotic metastatic bone disease. IMPRESSION: 1. Stable emphysematous changes but no infiltrates or edema. No evidence of pulmonary metastatic disease. 2. Small left pleural effusion with overlying atelectasis. 3. Stable large volume malignant ascites with omental disease and peritoneal surface disease. 4. Stable advanced atherosclerotic calcifications involving the aorta and iliac arteries. 5. Stable small scattered metastatic sclerotic bone lesions. Electronically Signed   By: PMarijo SanesM.D.   On: 06/21/2017 15:35   Ct Abdomen Pelvis W Contrast  Result Date: 06/21/2017 CLINICAL DATA:  Shortness of breath and weakness. History of metastatic breast cancer. EXAM: CT CHEST, ABDOMEN, AND PELVIS WITH CONTRAST TECHNIQUE: Multidetector CT imaging of the chest, abdomen and pelvis was performed following the standard protocol during bolus administration of intravenous contrast. CONTRAST:  1024mISOVUE-300 IOPAMIDOL (ISOVUE-300) INJECTION 61% COMPARISON:  CT 03/09/2017 FINDINGS: CT CHEST FINDINGS Cardiovascular: The  heart is normal in size. No pericardial effusion. Stable mild fusiform enlargement of the ascending aorta but no aneurysmal dilatation. Stable coronary artery calcifications. Mediastinum/Nodes: No mediastinal or hilar mass or adenopathy. The esophagus is grossly normal. Lungs/Pleura: Stable emphysematous changes. No findings for pulmonary metastatic disease or acute infiltrate or edema. There is a small left pleural effusion with overlying atelectasis. No obvious enhancing pleural nodules. Chest wall/ Musculoskeletal: No obvious breast mass, supraclavicular or axillary adenopathy. Left-sided Port-A-Cath is stable. The thyroid gland is grossly normal. Stable sclerotic osseous metastatic disease. No pathologic fracture or spinal canal compromise. CT ABDOMEN PELVIS  FINDINGS Hepatobiliary: Significant mass effect on the liver by a malignant abdominal ascites. The right and left hepatic lobes are compressed. No obvious peritoneal surface lesions or intraparenchymal metastasis. The gallbladder is normal. No common bile duct dilatation. Pancreas: No mass, inflammation or ductal dilatation. Spleen: Small splenic lesions are stable. Adrenals/Urinary Tract: The adrenal glands and kidneys are unremarkable and stable. Mild mass effect on both kidneys due to the large volume ascites and compression of the retroperitoneum. Stomach/Bowel: The stomach, duodenum, small bowel and colon are grossly normal. No obstructive findings are demonstrated. Persistence omental disease involving the transverse colon region along with the peritoneal surface disease involving the cecum and distal small bowel loops. Vascular/Lymphatic: Advanced atherosclerotic calcifications involving the aorta and iliac arteries but no aneurysm. No enlarged mesenteric or retroperitoneal lymph nodes. Reproductive: No adnexal mass. Other: Large volume abdominal/ pelvic ascites similar to the prior examination. Musculoskeletal: Stable scattered sclerotic metastatic bone disease. IMPRESSION: 1. Stable emphysematous changes but no infiltrates or edema. No evidence of pulmonary metastatic disease. 2. Small left pleural effusion with overlying atelectasis. 3. Stable large volume malignant ascites with omental disease and peritoneal surface disease. 4. Stable advanced atherosclerotic calcifications involving the aorta and iliac arteries. 5. Stable small scattered metastatic sclerotic bone lesions. Electronically Signed   By: Marijo Sanes M.D.   On: 06/21/2017 15:35    ASSESSMENT: Stage IV left upper outer quadrant lobular breast cancer with ovarian and abdominal metastasis. ER/PR positive, HER-2/neu not overexpressing.  PLAN:    1. Stage IV left upper outer quadrant lobular breast cancer with ovarian and abdominal  metastasis. ER/PR positive, HER-2/neu not overexpressing:  Imaging results reviewed independently and reported as above with what appears to be stable disease. Her ascites has increased. CA-27-29 continues to trend up slowly. CA-125 is also trending up. Once again discussed at length reinitiating treatment, likely using Ibrance, but will continue to hold as long as her imaging remains stable. She has acknowledged that she will likely need to restart treatment in the near future. Will not use Taxotere given her difficulties with recent treatments. Continue letrozole as prescribed. Return to clinic in 4 weeks for laboratory work and Zometa. 2. Right shoulder pain: Continue evaluation and treatment per primary care.   3. Ascites: Will order repeat ultrasound and paracentesis. 4. Nausea and vomiting: Patient will receive IV fluids as well as IV antiemetics today. She was also given prescriptions for dexamethasone and ondansetron ODT. 5. Diarrhea: Recommended over-the-counter Imodium. 6. Poor appetite: Dexamethasone as above. Patient was also given a referral to dietary.  Approximately 30 minutes was spent in discussion of which greater than 50% was consultation.  Patient expressed understanding and was in agreement with this plan. She also understands that She can call clinic at any time with any questions, concerns, or complaints.    Lloyd Huger, MD 07/01/17 2:09 PM

## 2017-06-30 ENCOUNTER — Other Ambulatory Visit: Payer: Self-pay | Admitting: *Deleted

## 2017-06-30 DIAGNOSIS — C50919 Malignant neoplasm of unspecified site of unspecified female breast: Secondary | ICD-10-CM

## 2017-07-01 ENCOUNTER — Inpatient Hospital Stay: Payer: Medicaid Other

## 2017-07-01 ENCOUNTER — Inpatient Hospital Stay (HOSPITAL_BASED_OUTPATIENT_CLINIC_OR_DEPARTMENT_OTHER): Payer: Medicaid Other | Admitting: Oncology

## 2017-07-01 ENCOUNTER — Telehealth: Payer: Self-pay | Admitting: Pharmacist

## 2017-07-01 ENCOUNTER — Other Ambulatory Visit: Payer: Self-pay | Admitting: *Deleted

## 2017-07-01 ENCOUNTER — Encounter: Payer: Self-pay | Admitting: Oncology

## 2017-07-01 VITALS — BP 130/96 | HR 101 | Temp 98.6°F | Resp 18 | Ht 62.0 in | Wt 101.5 lb

## 2017-07-01 DIAGNOSIS — C50912 Malignant neoplasm of unspecified site of left female breast: Secondary | ICD-10-CM

## 2017-07-01 DIAGNOSIS — C50919 Malignant neoplasm of unspecified site of unspecified female breast: Secondary | ICD-10-CM

## 2017-07-01 DIAGNOSIS — C786 Secondary malignant neoplasm of retroperitoneum and peritoneum: Secondary | ICD-10-CM | POA: Diagnosis not present

## 2017-07-01 DIAGNOSIS — Z170 Estrogen receptor positive status [ER+]: Principal | ICD-10-CM

## 2017-07-01 DIAGNOSIS — R112 Nausea with vomiting, unspecified: Secondary | ICD-10-CM

## 2017-07-01 DIAGNOSIS — F17210 Nicotine dependence, cigarettes, uncomplicated: Secondary | ICD-10-CM

## 2017-07-01 DIAGNOSIS — M25511 Pain in right shoulder: Secondary | ICD-10-CM | POA: Diagnosis not present

## 2017-07-01 DIAGNOSIS — R197 Diarrhea, unspecified: Secondary | ICD-10-CM

## 2017-07-01 DIAGNOSIS — Z79811 Long term (current) use of aromatase inhibitors: Secondary | ICD-10-CM

## 2017-07-01 DIAGNOSIS — C7951 Secondary malignant neoplasm of bone: Secondary | ICD-10-CM

## 2017-07-01 DIAGNOSIS — R180 Malignant ascites: Secondary | ICD-10-CM

## 2017-07-01 DIAGNOSIS — C50412 Malignant neoplasm of upper-outer quadrant of left female breast: Secondary | ICD-10-CM

## 2017-07-01 DIAGNOSIS — R630 Anorexia: Secondary | ICD-10-CM | POA: Diagnosis not present

## 2017-07-01 LAB — HEPATIC FUNCTION PANEL
ALK PHOS: 74 U/L (ref 38–126)
ALT: 7 U/L — AB (ref 14–54)
AST: 22 U/L (ref 15–41)
Albumin: 3.3 g/dL — ABNORMAL LOW (ref 3.5–5.0)
BILIRUBIN DIRECT: 0.2 mg/dL (ref 0.1–0.5)
BILIRUBIN TOTAL: 0.7 mg/dL (ref 0.3–1.2)
Indirect Bilirubin: 0.5 mg/dL (ref 0.3–0.9)
Total Protein: 6.7 g/dL (ref 6.5–8.1)

## 2017-07-01 LAB — BASIC METABOLIC PANEL
ANION GAP: 8 (ref 5–15)
BUN: 10 mg/dL (ref 6–20)
CALCIUM: 8.1 mg/dL — AB (ref 8.9–10.3)
CO2: 25 mmol/L (ref 22–32)
Chloride: 98 mmol/L — ABNORMAL LOW (ref 101–111)
Creatinine, Ser: 0.58 mg/dL (ref 0.44–1.00)
GFR calc non Af Amer: >60 mL/min (ref >60)
GLUCOSE: 127 mg/dL — AB (ref 65–99)
POTASSIUM: 3 mmol/L — AB (ref 3.5–5.1)
Sodium: 131 mmol/L — ABNORMAL LOW (ref 135–145)

## 2017-07-01 MED ORDER — SODIUM CHLORIDE 0.9 % IV SOLN: Freq: Once | INTRAVENOUS | Status: DC

## 2017-07-01 MED ORDER — SODIUM CHLORIDE 0.9 % IV SOLN
Freq: Once | INTRAVENOUS | Status: AC
  Administered 2017-07-01: 12:00:00 via INTRAVENOUS
  Filled 2017-07-01: qty 1000

## 2017-07-01 MED ORDER — SODIUM CHLORIDE 0.9 % IV SOLN
Freq: Once | INTRAVENOUS | Status: DC
  Filled 2017-07-01: qty 4

## 2017-07-01 MED ORDER — DEXAMETHASONE 4 MG PO TABS: 4.0000 mg | ORAL_TABLET | Freq: Every day | ORAL | 0 refills | Status: DC

## 2017-07-01 MED ORDER — SODIUM CHLORIDE 0.9 % IV SOLN
Freq: Once | INTRAVENOUS | Status: DC
  Filled 2017-07-01: qty 1000

## 2017-07-01 MED ORDER — HEPARIN SOD (PORK) LOCK FLUSH 100 UNIT/ML IV SOLN
INTRAVENOUS | Status: AC
  Filled 2017-07-01: qty 5

## 2017-07-01 MED ORDER — SODIUM CHLORIDE 0.9% FLUSH
10.0000 mL | INTRAVENOUS | Status: DC | PRN
  Administered 2017-07-01: 10 mL
  Filled 2017-07-01: qty 10

## 2017-07-01 MED ORDER — ZOLEDRONIC ACID 4 MG/100ML IV SOLN
4.0000 mg | Freq: Once | INTRAVENOUS | Status: AC
  Administered 2017-07-01: 4 mg via INTRAVENOUS
  Filled 2017-07-01: qty 100

## 2017-07-01 MED ORDER — ONDANSETRON 8 MG PO TBDP: 8.0000 mg | ORAL_TABLET | Freq: Three times a day (TID) | ORAL | 0 refills | Status: DC | PRN

## 2017-07-01 MED ORDER — SODIUM CHLORIDE 0.9 % IV SOLN
Freq: Once | INTRAVENOUS | Status: AC
  Administered 2017-07-01: 13:00:00 via INTRAVENOUS
  Filled 2017-07-01: qty 4

## 2017-07-01 MED ORDER — HEPARIN SOD (PORK) LOCK FLUSH 100 UNIT/ML IV SOLN
500.0000 [IU] | Freq: Once | INTRAVENOUS | Status: AC | PRN
Start: 2017-07-01 — End: 2017-07-01
  Administered 2017-07-01: 500 [IU]

## 2017-07-01 NOTE — Telephone Encounter (Signed)
Corrected Calcium 8.66.

## 2017-07-01 NOTE — Progress Notes (Signed)
Not able to eat 2 tsp. And then gets sick and vomits and tried nausea med before she goes to eat and it does not work,. Weak all lover, light headed today. She has diarrhea and vomiting constantly. The vomiting is yellow acid looking.

## 2017-07-02 LAB — CANCER ANTIGEN 27.29: CAN 27.29: 239.4 U/mL — AB (ref 0.0–38.6)

## 2017-07-04 ENCOUNTER — Inpatient Hospital Stay: Payer: Medicaid Other

## 2017-07-04 NOTE — Progress Notes (Signed)
Nutrition  Patient was no show for nutrition appointment today.  Kathya Wilz B. Shantoya Geurts, RD, LDN Registered Dietitian [phone] (pager)   

## 2017-07-04 NOTE — Discharge Instructions (Signed)
Paracentesis, Care After °Refer to this sheet in the next few weeks. These instructions provide you with information about caring for yourself after your procedure. Your health care provider may also give you more specific instructions. Your treatment has been planned according to current medical practices, but problems sometimes occur. Call your health care provider if you have any problems or questions after your procedure. °What can I expect after the procedure? °After your procedure, it is common to have a small amount of clear fluid coming from the puncture site. °Follow these instructions at home: °· Return to your normal activities as told by your health care provider. Ask your health care provider what activities are safe for you. °· Take over-the-counter and prescription medicines only as told by your health care provider. °· Do not take baths, swim, or use a hot tub until your health care provider approves. °· Follow instructions from your health care provider about: °? How to take care of your puncture site. °? When and how you should change your bandage (dressing). °? When you should remove your dressing. °· Check your puncture area every day signs of infection. Watch for: °? Redness, swelling, or pain. °? Fluid, blood, or pus. °· Keep all follow-up visits as told by your health care provider. This is important. °Contact a health care provider if: °· You have redness, swelling, or pain at your puncture site. °· You start to have more clear fluid coming from your puncture site. °· You have blood or pus coming from your puncture site. °· You have chills. °· You have a fever. °Get help right away if: °· You develop chest pain or shortness of breath. °· You develop increasing pain, discomfort, or swelling in your abdomen. °· You feel dizzy or light-headed or you pass out. °This information is not intended to replace advice given to you by your health care provider. Make sure you discuss any questions you  have with your health care provider. °Document Released: 03/11/2015 Document Revised: 04/01/2016 Document Reviewed: 01/07/2015 °[employer]vier Interactive Patient Education © 2018 [employer] ° °

## 2017-07-05 ENCOUNTER — Ambulatory Visit
Admission: RE | Admit: 2017-07-05 | Discharge: 2017-07-05 | Disposition: A | Discharge status: Home / Self Care | Payer: Medicaid Other | Source: Ambulatory Visit | Attending: Oncology | Admitting: Oncology

## 2017-07-26 NOTE — Progress Notes (Signed)
[place]  Telephone:([phone]) [phone] Fax:(336) [phone]  ID: [name] OB: [birth year]  MR#: [id number]  CSN#:[id number]  Patient Care Team: [facility] as PCP - General  CHIEF COMPLAINT: Stage IV left upper outer quadrant lobular breast cancer with ovarian and abdominal metastasis. ER/PR positive, HER-2/neu not overexpressing.  INTERVAL HISTORY: Patient returns to clinic today for further evaluation, laboratory work, and treatment planning. She has worsening ascites and a poor appetite. Her performance status is declining and she has chronic weakness and fatigue. She continues to have persistent nausea. She continues to be highly anxious.  She has no neurologic complaints. She denies any fevers, chills, or recent illnesses. She denies chest pain. She has no urinary complaints. She denies hematuria and hematochezia.  She reports difficulty getting to sleep.  Patient offers no further specific complaints.  REVIEW OF SYSTEMS:   Review of Systems  Constitutional: Positive for malaise/fatigue and weight loss. Negative for fever.  Eyes: Negative.   Respiratory: Positive for shortness of breath. Negative for cough.   Cardiovascular: Negative.  Negative for chest pain and leg swelling.  Gastrointestinal: Positive for abdominal pain, diarrhea, heartburn and nausea. Negative for blood in stool, constipation and vomiting.  Genitourinary: Negative.   Musculoskeletal: Negative for joint pain.  Skin: Negative.  Negative for rash.  Neurological: Positive for weakness. Negative for tingling, sensory change and headaches.  Psychiatric/Behavioral: The patient is nervous/anxious and has insomnia.     As per HPI. Otherwise, a complete review of systems is negative.  PAST MEDICAL HISTORY: Past Medical History:  Diagnosis Date  . Asthma 2005  . Breast cancer ([place])   . CAD (coronary artery disease)   . Cancer [facility]) 2012   breast cancer, right-treated  with neoadjunvant chemotherapy followed by partial mastectomy with sn bx and radiation. Pt is currently taking Tamoxifen since January 2013   . Hypertension 2012  . Lump or mass in breast 2012   right  . Malignant neoplasm of upper-outer quadrant of female breast ([place]) 2012   right  . MI (myocardial infarction) ([place])   . Myocardial infarction ([place]) 10/2012  . Personal history of tobacco use, presenting hazards to health     PAST SURGICAL HISTORY: Past Surgical History:  Procedure Laterality Date  . BREAST LUMPECTOMY Right 2012  . CESAREAN SECTION  [id number]  . CORONARY ANGIOPLASTY WITH STENT PLACEMENT  20123   Myocardial infarction  . FINGER SURGERY Left 1986   reconstruction, left pinky  . LEFT HEART CATHETERIZATION WITH CORONARY ANGIOGRAM N/A 10/16/2012   Procedure: LEFT HEART CATHETERIZATION WITH CORONARY ANGIOGRAM;  Surgeon: Clent Demark, MD;  Location: [facility] CATH LAB;  Service: Cardiovascular;  Laterality: N/A;  . PORTACATH PLACEMENT    . TUBAL LIGATION  2004  . WRIST SURGERY Left 1987    FAMILY HISTORY Family History  Problem Relation Age of Onset  . Cancer Maternal Grandmother 69       breast       ADVANCED DIRECTIVES:    HEALTH MAINTENANCE: Social History  Substance Use Topics  . Smoking status: Current Every Day Smoker    Packs/day: 0.50    Years: 15.00    Types: Cigarettes  . Smokeless tobacco: Never Used  . Alcohol use No     Allergies  Allergen Reactions  . Codeine Nausea And Vomiting  . Penicillins Rash    Current Outpatient Prescriptions  Medication Sig Dispense Refill  . albuterol (PROVENTIL HFA;VENTOLIN HFA) 108 (90 Base) MCG/ACT inhaler  Inhale 2 puffs into the lungs every 6 (six) hours as needed for wheezing or shortness of breath. 1 Inhaler 0  . aspirin EC 81 MG EC tablet Take 1 tablet (81 mg total) by mouth daily. 30 tablet 3  . dexamethasone (DECADRON) 4 MG tablet Take 1 tablet (4 mg total) by mouth daily. 30 tablet 0  . nitroGLYCERIN  (NITROSTAT) 0.4 MG SL tablet Place 1 tablet (0.4 mg total) under the tongue every 5 (five) minutes x 3 doses as needed for chest pain. 30 tablet 0  . ondansetron (ZOFRAN ODT) 8 MG disintegrating tablet Take 1 tablet (8 mg total) by mouth every 8 (eight) hours as needed for nausea or vomiting. 20 tablet 0  . oxyCODONE-acetaminophen (PERCOCET/ROXICET) 5-325 MG tablet Take 1 tablet by mouth at bedtime as needed for severe pain. 30 tablet 0  . prochlorperazine (COMPAZINE) 10 MG tablet Take 1 tablet (10 mg total) by mouth every 6 (six) hours as needed for nausea or vomiting. 30 tablet 0  . letrozole (FEMARA) 2.5 MG tablet Take 1 tablet (2.5 mg total) by mouth daily. 90 tablet 3  . palbociclib (IBRANCE) 100 MG capsule Take 1 capsule (100 mg total) by mouth daily with breakfast. Take for 21 day, then 7 days off. Take whole with food. 21 capsule 5  . potassium chloride SA (K-DUR,KLOR-CON) 20 MEQ tablet Take 1 tablet (20 mEq total) by mouth 2 (two) times daily. (Patient not taking: Reported on 07/29/2017) 60 tablet 3   No current facility-administered medications for this visit.    Facility-Administered Medications Ordered in Other Visits  Medication Dose Route Frequency Provider Last Rate Last Dose  . sodium chloride flush (NS) 0.9 % injection 10 mL  10 mL Intravenous PRN Lloyd Huger, MD   10 mL at 11/05/16 1035    OBJECTIVE: Vitals:   07/29/17 1043 07/29/17 1215  BP:  109/79  Pulse:  88  Resp: 18 17  Temp: 97.6 F (36.4 C) 97.7 F (36.5 C)     Body mass index is 17.88 kg/m.    ECOG FS:3 - Symptomatic, >50% confined to bed  General: Ill-appearing, no acute distress. Eyes: anicteric sclera. Breasts: Exam deferred today. Lungs: Clear to auscultation bilaterally. Heart: Regular rate and rhythm. No rubs, murmurs, or gallops. Abdomen: Firm, distended, nontender.  Musculoskeletal: Minimal right shoulder mobility secondary to pain.  Neuro: Alert, answering all questions appropriately.    Skin: No rashes or petechiae noted. Psych: Normal affect.   LAB RESULTS:  Lab Results  Component Value Date   NA 131 (L) 07/01/2017   K 3.0 (L) 07/01/2017   CL 98 (L) 07/01/2017   CO2 25 07/01/2017   GLUCOSE 127 (H) 07/01/2017   BUN 10 07/01/2017   CREATININE 0.58 07/01/2017   CALCIUM 8.1 (L) 07/01/2017   PROT 6.7 07/01/2017   ALBUMIN 3.3 (L) 07/01/2017   AST 22 07/01/2017   ALT 7 (L) 07/01/2017   ALKPHOS 74 07/01/2017   BILITOT 0.7 07/01/2017   GFRNONAA >60 07/01/2017   GFRAA >60 07/01/2017    Lab Results  Component Value Date   WBC 5.6 03/09/2017   NEUTROABS 3.9 03/09/2017   HGB 11.7 (L) 03/09/2017   HCT 34.6 (L) 03/09/2017   MCV 87.8 03/09/2017   PLT 353 03/09/2017   Lab Results  Component Value Date   LABCA2 112.3 (H) 03/23/2017   Lab Results  Component Value Date   CA125 86.3 (H) 03/23/2017    STUDIES: No results found.  ASSESSMENT:  Stage IV left upper outer quadrant lobular breast cancer with ovarian and abdominal metastasis. ER/PR positive, HER-2/neu not overexpressing.  PLAN:    1. 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 ER/PR positive, HER-2/neu not overexpressing: Given patient's worsening ascites and significantly increased CA-27-29 of 1460, patient now has clear progression of disease. Will not use Taxotere given her difficulties with recent treatments. Given her poor performance status, treatment will be difficult but patient has agreed to initiate dose reduced Ibrance 100 mg for 21 days with 7 days off. She will also continue letrozole as prescribed. Return to clinic in 4 weeks for laboratory work and Zometa. 2. Right shoulder pain: Continue evaluation and treatment per primary care.   3. Ascites: Will order repeat ultrasound and paracentesis to be completed ASAP. 4. Nausea and vomiting: Patient has declined additional IV fluids today. She was also given prescriptions for dexamethasone and  ondansetron ODT. 5. Diarrhea: Recommended over-the-counter Imodium. 6. Poor appetite: Dexamethasone as above. Patient was previously given a referral to dietary.  Approximately 30 minutes was spent in discussion of which greater than 50% was consultation.  Patient expressed understanding and was in agreement with this plan. She also understands that She can call clinic at any time with any questions, concerns, or complaints.    Lloyd Huger, MD 08/03/17 12:37 PM

## 2017-07-29 ENCOUNTER — Inpatient Hospital Stay: Payer: Medicaid Other | Attending: Oncology | Admitting: Oncology

## 2017-07-29 ENCOUNTER — Inpatient Hospital Stay: Payer: Medicaid Other

## 2017-07-29 VITALS — BP 109/79 | HR 88 | Temp 97.7°F | Resp 17

## 2017-07-29 VITALS — BP 109/79 | HR 88 | Temp 97.7°F | Resp 17 | Wt 97.8 lb

## 2017-07-29 DIAGNOSIS — Z923 Personal history of irradiation: Secondary | ICD-10-CM | POA: Diagnosis not present

## 2017-07-29 DIAGNOSIS — R112 Nausea with vomiting, unspecified: Secondary | ICD-10-CM | POA: Insufficient documentation

## 2017-07-29 DIAGNOSIS — J45909 Unspecified asthma, uncomplicated: Secondary | ICD-10-CM | POA: Diagnosis not present

## 2017-07-29 DIAGNOSIS — C786 Secondary malignant neoplasm of retroperitoneum and peritoneum: Secondary | ICD-10-CM | POA: Insufficient documentation

## 2017-07-29 DIAGNOSIS — R630 Anorexia: Secondary | ICD-10-CM | POA: Diagnosis not present

## 2017-07-29 DIAGNOSIS — Z170 Estrogen receptor positive status [ER+]: Secondary | ICD-10-CM | POA: Diagnosis not present

## 2017-07-29 DIAGNOSIS — Z9011 Acquired absence of right breast and nipple: Secondary | ICD-10-CM | POA: Insufficient documentation

## 2017-07-29 DIAGNOSIS — M25511 Pain in right shoulder: Secondary | ICD-10-CM | POA: Insufficient documentation

## 2017-07-29 DIAGNOSIS — F17210 Nicotine dependence, cigarettes, uncomplicated: Secondary | ICD-10-CM | POA: Insufficient documentation

## 2017-07-29 DIAGNOSIS — R180 Malignant ascites: Secondary | ICD-10-CM | POA: Diagnosis not present

## 2017-07-29 DIAGNOSIS — Z79811 Long term (current) use of aromatase inhibitors: Secondary | ICD-10-CM | POA: Insufficient documentation

## 2017-07-29 DIAGNOSIS — C7951 Secondary malignant neoplasm of bone: Secondary | ICD-10-CM | POA: Diagnosis not present

## 2017-07-29 DIAGNOSIS — C50412 Malignant neoplasm of upper-outer quadrant of left female breast: Secondary | ICD-10-CM | POA: Insufficient documentation

## 2017-07-29 DIAGNOSIS — C50912 Malignant neoplasm of unspecified site of left female breast: Secondary | ICD-10-CM

## 2017-07-29 DIAGNOSIS — I10 Essential (primary) hypertension: Secondary | ICD-10-CM | POA: Diagnosis not present

## 2017-07-29 DIAGNOSIS — Z955 Presence of coronary angioplasty implant and graft: Secondary | ICD-10-CM | POA: Diagnosis not present

## 2017-07-29 DIAGNOSIS — Z7982 Long term (current) use of aspirin: Secondary | ICD-10-CM | POA: Insufficient documentation

## 2017-07-29 DIAGNOSIS — I252 Old myocardial infarction: Secondary | ICD-10-CM | POA: Insufficient documentation

## 2017-07-29 DIAGNOSIS — I2510 Atherosclerotic heart disease of native coronary artery without angina pectoris: Secondary | ICD-10-CM | POA: Insufficient documentation

## 2017-07-29 DIAGNOSIS — Z9221 Personal history of antineoplastic chemotherapy: Secondary | ICD-10-CM | POA: Insufficient documentation

## 2017-07-29 DIAGNOSIS — R197 Diarrhea, unspecified: Secondary | ICD-10-CM | POA: Diagnosis not present

## 2017-07-29 MED ORDER — PALBOCICLIB 100 MG PO CAPS
100.0000 mg | ORAL_CAPSULE | Freq: Every day | ORAL | 5 refills | Status: DC
Start: 2017-07-29 — End: 2017-08-02

## 2017-07-29 MED ORDER — HEPARIN SOD (PORK) LOCK FLUSH 100 UNIT/ML IV SOLN
500.0000 [IU] | Freq: Once | INTRAVENOUS | Status: AC | PRN
  Administered 2017-07-29: 500 [IU]

## 2017-07-29 MED ORDER — ONDANSETRON HCL 40 MG/20ML IJ SOLN
8.0000 mg | Freq: Once | INTRAMUSCULAR | Status: AC
  Administered 2017-07-29: 8 mg via INTRAVENOUS
  Filled 2017-07-29: qty 4

## 2017-07-29 MED ORDER — DEXAMETHASONE SODIUM PHOSPHATE 10 MG/ML IJ SOLN
10.0000 mg | Freq: Once | INTRAMUSCULAR | Status: AC
  Administered 2017-07-29: 10 mg via INTRAVENOUS

## 2017-07-29 MED ORDER — LETROZOLE 2.5 MG PO TABS: 2.5000 mg | ORAL_TABLET | Freq: Every day | ORAL | 3 refills | Status: AC

## 2017-07-29 MED ORDER — PALBOCICLIB 100 MG PO CAPS: 100.0000 mg | ORAL_CAPSULE | Freq: Every day | ORAL | 5 refills | Status: DC

## 2017-07-29 MED ORDER — ZOLEDRONIC ACID 4 MG/100ML IV SOLN
4.0000 mg | Freq: Once | INTRAVENOUS | Status: AC
  Administered 2017-07-29: 4 mg via INTRAVENOUS

## 2017-07-29 MED ORDER — SODIUM CHLORIDE 0.9 % IV SOLN: Freq: Once | INTRAVENOUS | Status: DC

## 2017-07-29 MED ORDER — OXYCODONE-ACETAMINOPHEN 5-325 MG PO TABS: 1.0000 | ORAL_TABLET | Freq: Every evening | ORAL | 0 refills | Status: DC | PRN

## 2017-07-29 MED ORDER — SODIUM CHLORIDE 0.9% FLUSH
10.0000 mL | INTRAVENOUS | Status: DC | PRN
  Administered 2017-07-29: 10 mL
  Filled 2017-07-29: qty 10

## 2017-07-29 NOTE — Progress Notes (Signed)
Ca =8.1, alb =3.3 corrected Calcium =8.66

## 2017-07-30 LAB — CA 125: CANCER ANTIGEN (CA) 125: 283.7 U/mL — AB (ref 0.0–38.1)

## 2017-07-30 LAB — CANCER ANTIGEN 27.29: CA 27.29: 1460.5 U/mL — ABNORMAL HIGH (ref 0.0–38.6)

## 2017-08-01 ENCOUNTER — Other Ambulatory Visit: Payer: Self-pay

## 2017-08-01 DIAGNOSIS — R188 Other ascites: Secondary | ICD-10-CM

## 2017-08-02 ENCOUNTER — Telehealth: Payer: Self-pay

## 2017-08-02 DIAGNOSIS — C50912 Malignant neoplasm of unspecified site of left female breast: Secondary | ICD-10-CM

## 2017-08-02 DIAGNOSIS — C50412 Malignant neoplasm of upper-outer quadrant of left female breast: Secondary | ICD-10-CM

## 2017-08-02 MED ORDER — PALBOCICLIB 100 MG PO CAPS: 100.0000 mg | ORAL_CAPSULE | Freq: Every day | ORAL | 5 refills | Status: DC

## 2017-08-02 MED FILL — IBRANCE 100 MG CAPSULE: 100 | 28 days supply | Qty: 21 | Fill #0

## 2017-08-02 NOTE — Telephone Encounter (Signed)
Oral Oncology Patient Advocate Encounter   Called patient to let her know her medication Leslee Home will be mailed to her home from [facility]. She has a $3.00 copayment that we will send her a bill for. Told her to expect a monthly call from the pharmacy.    [place] Patient Advocate [phone] 08/02/2017 11:36 AM

## 2017-08-02 NOTE — Telephone Encounter (Signed)
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  LFTs from 07/01/2017 assessed and there were no relevant lab abnormalities. CBC with differential from 03/09/2017 assessed and there were no relevant lab abnormalities. Prescription dose and frequency assessed.   Current medication list in Epic reviewed, DDIs with with no relevant interactions identified:  Prescription has been e-scribed to the [facility] for benefits analysis and approval.  Oral Oncology Clinic will continue to follow for insurance authorization, copayment issues, initial counseling and start date.  Lendon Ka, PharmD Pharmacy Resident [facility]  08/02/2017 9:48 AM

## 2017-08-02 NOTE — Telephone Encounter (Signed)
Oral Chemotherapy Pharmacist Encounter  I spoke with patient for overview of new oral chemotherapy medication: Ibrance for the treatment of metastatic breast cancer, planned duration until disease progression or unacceptable drug toxicity.   Pt is doing well. Counseled patient on administration, dosing, side effects, monitoring, drug-food interactions, safe handling, storage, and disposal. Patient will take 1 capsule (100 mg total) by mouth daily with breakfast. Take for 21 day, then 7 days off. Take whole with food.  Patient should have medication in hand tomorrow and she knows to start the medication as soon as she receives it.  Side effects include but not limited to: neutropenia, infection, N/V/D, fatigue, rash.    Reviewed with patient importance of keeping a medication schedule and plan for any missed doses.  [name] voiced understanding and appreciation.   All questions answered. Will follow up with patient regarding insurance and pharmacy.   Provided patient with [facility] phone number. Patient knows to call the office with questions or concerns. Oral Chemotherapy Navigation Clinic will continue to follow.  Thank you,  Darl Pikes, PharmD, BCPS Hematology/Oncology Clinical Pharmacist [facility] [phone]  08/02/2017 11:18 AM

## 2017-08-02 NOTE — Discharge Instructions (Signed)
Paracentesis, Care After °Refer to this sheet in the next few weeks. These instructions provide you with information about caring for yourself after your procedure. Your health care provider may also give you more specific instructions. Your treatment has been planned according to current medical practices, but problems sometimes occur. Call your health care provider if you have any problems or questions after your procedure. °What can I expect after the procedure? °After your procedure, it is common to have a small amount of clear fluid coming from the puncture site. °Follow these instructions at home: °· Return to your normal activities as told by your health care provider. Ask your health care provider what activities are safe for you. °· Take over-the-counter and prescription medicines only as told by your health care provider. °· Do not take baths, swim, or use a hot tub until your health care provider approves. °· Follow instructions from your health care provider about: °? How to take care of your puncture site. °? When and how you should change your bandage (dressing). °? When you should remove your dressing. °· Check your puncture area every day signs of infection. Watch for: °? Redness, swelling, or pain. °? Fluid, blood, or pus. °· Keep all follow-up visits as told by your health care provider. This is important. °Contact a health care provider if: °· You have redness, swelling, or pain at your puncture site. °· You start to have more clear fluid coming from your puncture site. °· You have blood or pus coming from your puncture site. °· You have chills. °· You have a fever. °Get help right away if: °· You develop chest pain or shortness of breath. °· You develop increasing pain, discomfort, or swelling in your abdomen. °· You feel dizzy or light-headed or you pass out. °This information is not intended to replace advice given to you by your health care provider. Make sure you discuss any questions you  have with your health care provider. °Document Released: 03/11/2015 Document Revised: 04/01/2016 Document Reviewed: 01/07/2015 °[employer]vier Interactive Patient Education © 2018 [employer] ° °

## 2017-08-05 ENCOUNTER — Ambulatory Visit
Admission: RE | Admit: 2017-08-05 | Discharge: 2017-08-05 | Disposition: A | Discharge status: Home / Self Care | Payer: Medicaid Other | Source: Ambulatory Visit | Attending: Oncology | Admitting: Oncology

## 2017-08-05 DIAGNOSIS — R188 Other ascites: Secondary | ICD-10-CM | POA: Diagnosis present

## 2017-08-05 DIAGNOSIS — R1011 Right upper quadrant pain: Secondary | ICD-10-CM | POA: Insufficient documentation

## 2017-08-05 NOTE — Progress Notes (Signed)
Called back to [place] after paracentesis complete secondary to abdominal pain in the RUQ 7/10.  Upon my arrival patient appeared in minimal discomfort, but hold her epigastrium and RUQ.  She has a palpable mass in this area.  [place] placed on this lesion.  This is her liver that she has not been able to feel secondary to her ascites.  She was put back at about 60 degrees as she felt light-headed.  Her BP was 95/50s.  After several minutes her pain came down to a 2 and her light-headedness improved.  She was sat back up and BP returned to baseline, 109/60s.  She felt well enough to leave.  She was placed in her wheelchair with no issues.  She was given US's direct number in case she has any further issues.  [name] E 10:07 AM 08/05/2017

## 2017-08-05 NOTE — Procedures (Signed)
Ultrasound-guided therapeutic paracentesis performed yielding 5 liters of serous colored fluid. No immediate complications.  Minah Axelrod E 9:40 AM 08/05/2017

## 2017-08-11 ENCOUNTER — Emergency Department: Payer: Medicaid Other

## 2017-08-11 ENCOUNTER — Inpatient Hospital Stay
Admission: EM | Admit: 2017-08-11 | Discharge: 2017-08-15 | DRG: 871 | Disposition: A | Discharge status: Hospice | Payer: Medicaid Other | Attending: Internal Medicine | Admitting: Internal Medicine

## 2017-08-11 ENCOUNTER — Encounter: Payer: Self-pay | Admitting: Emergency Medicine

## 2017-08-11 ENCOUNTER — Other Ambulatory Visit: Payer: Self-pay

## 2017-08-11 DIAGNOSIS — Z955 Presence of coronary angioplasty implant and graft: Secondary | ICD-10-CM | POA: Diagnosis not present

## 2017-08-11 DIAGNOSIS — Z923 Personal history of irradiation: Secondary | ICD-10-CM | POA: Diagnosis not present

## 2017-08-11 DIAGNOSIS — Z515 Encounter for palliative care: Secondary | ICD-10-CM | POA: Diagnosis not present

## 2017-08-11 DIAGNOSIS — E876 Hypokalemia: Secondary | ICD-10-CM | POA: Diagnosis present

## 2017-08-11 DIAGNOSIS — C786 Secondary malignant neoplasm of retroperitoneum and peritoneum: Secondary | ICD-10-CM | POA: Diagnosis not present

## 2017-08-11 DIAGNOSIS — R64 Cachexia: Secondary | ICD-10-CM | POA: Diagnosis present

## 2017-08-11 DIAGNOSIS — R6521 Severe sepsis with septic shock: Secondary | ICD-10-CM | POA: Diagnosis present

## 2017-08-11 DIAGNOSIS — E878 Other disorders of electrolyte and fluid balance, not elsewhere classified: Secondary | ICD-10-CM | POA: Diagnosis present

## 2017-08-11 DIAGNOSIS — Z9010 Acquired absence of unspecified breast and nipple: Secondary | ICD-10-CM

## 2017-08-11 DIAGNOSIS — I2510 Atherosclerotic heart disease of native coronary artery without angina pectoris: Secondary | ICD-10-CM | POA: Diagnosis present

## 2017-08-11 DIAGNOSIS — T68XXXA Hypothermia, initial encounter: Secondary | ICD-10-CM

## 2017-08-11 DIAGNOSIS — Z170 Estrogen receptor positive status [ER+]: Secondary | ICD-10-CM | POA: Diagnosis not present

## 2017-08-11 DIAGNOSIS — Z9011 Acquired absence of right breast and nipple: Secondary | ICD-10-CM | POA: Diagnosis not present

## 2017-08-11 DIAGNOSIS — Z880 Allergy status to penicillin: Secondary | ICD-10-CM

## 2017-08-11 DIAGNOSIS — I10 Essential (primary) hypertension: Secondary | ICD-10-CM | POA: Diagnosis present

## 2017-08-11 DIAGNOSIS — E43 Unspecified severe protein-calorie malnutrition: Secondary | ICD-10-CM | POA: Insufficient documentation

## 2017-08-11 DIAGNOSIS — Z853 Personal history of malignant neoplasm of breast: Secondary | ICD-10-CM

## 2017-08-11 DIAGNOSIS — F17210 Nicotine dependence, cigarettes, uncomplicated: Secondary | ICD-10-CM | POA: Diagnosis not present

## 2017-08-11 DIAGNOSIS — C7951 Secondary malignant neoplasm of bone: Secondary | ICD-10-CM | POA: Diagnosis present

## 2017-08-11 DIAGNOSIS — R197 Diarrhea, unspecified: Secondary | ICD-10-CM | POA: Diagnosis not present

## 2017-08-11 DIAGNOSIS — I252 Old myocardial infarction: Secondary | ICD-10-CM | POA: Diagnosis not present

## 2017-08-11 DIAGNOSIS — Z809 Family history of malignant neoplasm, unspecified: Secondary | ICD-10-CM

## 2017-08-11 DIAGNOSIS — R627 Adult failure to thrive: Secondary | ICD-10-CM

## 2017-08-11 DIAGNOSIS — R680 Hypothermia, not associated with low environmental temperature: Secondary | ICD-10-CM | POA: Diagnosis present

## 2017-08-11 DIAGNOSIS — E871 Hypo-osmolality and hyponatremia: Secondary | ICD-10-CM | POA: Diagnosis present

## 2017-08-11 DIAGNOSIS — I248 Other forms of acute ischemic heart disease: Secondary | ICD-10-CM | POA: Diagnosis present

## 2017-08-11 DIAGNOSIS — Z7982 Long term (current) use of aspirin: Secondary | ICD-10-CM | POA: Diagnosis not present

## 2017-08-11 DIAGNOSIS — C7962 Secondary malignant neoplasm of left ovary: Secondary | ICD-10-CM | POA: Diagnosis present

## 2017-08-11 DIAGNOSIS — C7961 Secondary malignant neoplasm of right ovary: Secondary | ICD-10-CM | POA: Diagnosis present

## 2017-08-11 DIAGNOSIS — D649 Anemia, unspecified: Secondary | ICD-10-CM | POA: Diagnosis present

## 2017-08-11 DIAGNOSIS — Z90710 Acquired absence of both cervix and uterus: Secondary | ICD-10-CM

## 2017-08-11 DIAGNOSIS — C7982 Secondary malignant neoplasm of genital organs: Secondary | ICD-10-CM | POA: Diagnosis present

## 2017-08-11 DIAGNOSIS — Z681 Body mass index (BMI) 19 or less, adult: Secondary | ICD-10-CM

## 2017-08-11 DIAGNOSIS — R180 Malignant ascites: Secondary | ICD-10-CM | POA: Diagnosis present

## 2017-08-11 DIAGNOSIS — R112 Nausea with vomiting, unspecified: Secondary | ICD-10-CM | POA: Diagnosis not present

## 2017-08-11 DIAGNOSIS — G893 Neoplasm related pain (acute) (chronic): Secondary | ICD-10-CM | POA: Diagnosis present

## 2017-08-11 DIAGNOSIS — A419 Sepsis, unspecified organism: Secondary | ICD-10-CM | POA: Diagnosis present

## 2017-08-11 DIAGNOSIS — C50919 Malignant neoplasm of unspecified site of unspecified female breast: Secondary | ICD-10-CM

## 2017-08-11 DIAGNOSIS — C50412 Malignant neoplasm of upper-outer quadrant of left female breast: Secondary | ICD-10-CM | POA: Diagnosis not present

## 2017-08-11 DIAGNOSIS — R630 Anorexia: Secondary | ICD-10-CM | POA: Diagnosis not present

## 2017-08-11 DIAGNOSIS — C787 Secondary malignant neoplasm of liver and intrahepatic bile duct: Secondary | ICD-10-CM | POA: Diagnosis present

## 2017-08-11 DIAGNOSIS — Z7189 Other specified counseling: Secondary | ICD-10-CM | POA: Diagnosis not present

## 2017-08-11 DIAGNOSIS — E860 Dehydration: Secondary | ICD-10-CM | POA: Diagnosis present

## 2017-08-11 DIAGNOSIS — Z9221 Personal history of antineoplastic chemotherapy: Secondary | ICD-10-CM | POA: Diagnosis not present

## 2017-08-11 DIAGNOSIS — J45909 Unspecified asthma, uncomplicated: Secondary | ICD-10-CM | POA: Diagnosis present

## 2017-08-11 DIAGNOSIS — Z885 Allergy status to narcotic agent status: Secondary | ICD-10-CM

## 2017-08-11 DIAGNOSIS — M25511 Pain in right shoulder: Secondary | ICD-10-CM | POA: Diagnosis not present

## 2017-08-11 DIAGNOSIS — Z66 Do not resuscitate: Secondary | ICD-10-CM | POA: Diagnosis present

## 2017-08-11 LAB — CBC WITH DIFFERENTIAL/PLATELET
BASOS ABS: 0 10*3/uL (ref 0–0.1)
Basophils Relative: 1 %
Eosinophils Absolute: 0 10*3/uL (ref 0–0.7)
Eosinophils Relative: 0 %
HEMATOCRIT: 30.9 % — AB (ref 35.0–47.0)
Hemoglobin: 10.6 g/dL — ABNORMAL LOW (ref 12.0–16.0)
LYMPHS PCT: 9 %
Lymphs Abs: 0.5 10*3/uL — ABNORMAL LOW (ref 1.0–3.6)
MCH: 29 pg (ref 26.0–34.0)
MCHC: 34.3 g/dL (ref 32.0–36.0)
MCV: 84.6 fL (ref 80.0–100.0)
MONO ABS: 0.3 10*3/uL (ref 0.2–0.9)
Monocytes Relative: 6 %
NEUTROS ABS: 5.1 10*3/uL (ref 1.4–6.5)
Neutrophils Relative %: 84 %
Platelets: 189 10*3/uL (ref 150–440)
RBC: 3.66 MIL/uL — AB (ref 3.80–5.20)
RDW: 17 % — ABNORMAL HIGH (ref 11.5–14.5)
WBC: 6.1 10*3/uL (ref 3.6–11.0)

## 2017-08-11 LAB — PROTIME-INR
INR: 1.11
PROTHROMBIN TIME: 14.2 s (ref 11.4–15.2)

## 2017-08-11 LAB — URINALYSIS, ROUTINE W REFLEX MICROSCOPIC
BILIRUBIN URINE: NEGATIVE
Bacteria, UA: NONE SEEN
GLUCOSE, UA: NEGATIVE mg/dL
KETONES UR: 20 mg/dL — AB
LEUKOCYTES UA: NEGATIVE
NITRITE: NEGATIVE
PH: 5 (ref 5.0–8.0)
Protein, ur: 30 mg/dL — AB
SPECIFIC GRAVITY, URINE: 1.019 (ref 1.005–1.030)

## 2017-08-11 LAB — COMPREHENSIVE METABOLIC PANEL
ALBUMIN: 2.7 g/dL — AB (ref 3.5–5.0)
ALT: 31 U/L (ref 14–54)
AST: 131 U/L — AB (ref 15–41)
Alkaline Phosphatase: 424 U/L — ABNORMAL HIGH (ref 38–126)
Anion gap: 12 (ref 5–15)
BUN: 14 mg/dL (ref 6–20)
CHLORIDE: 89 mmol/L — AB (ref 101–111)
CO2: 22 mmol/L (ref 22–32)
CREATININE: 0.57 mg/dL (ref 0.44–1.00)
Calcium: 7.6 mg/dL — ABNORMAL LOW (ref 8.9–10.3)
GFR calc non Af Amer: >60 mL/min (ref >60)
Glucose, Bld: 66 mg/dL (ref 65–99)
Potassium: 3.3 mmol/L — ABNORMAL LOW (ref 3.5–5.1)
SODIUM: 123 mmol/L — AB (ref 135–145)
Total Bilirubin: 2.6 mg/dL — ABNORMAL HIGH (ref 0.3–1.2)
Total Protein: 5.7 g/dL — ABNORMAL LOW (ref 6.5–8.1)

## 2017-08-11 LAB — MRSA PCR SCREENING: MRSA BY PCR: NEGATIVE

## 2017-08-11 LAB — LIPASE, BLOOD: Lipase: 31 U/L (ref 11–51)

## 2017-08-11 LAB — GLUCOSE, CAPILLARY: GLUCOSE-CAPILLARY: 65 mg/dL (ref 65–99)

## 2017-08-11 LAB — LACTIC ACID, PLASMA: LACTIC ACID, VENOUS: 1 mmol/L (ref 0.5–1.9)

## 2017-08-11 LAB — TROPONIN I: TROPONIN I: 0.03 ng/mL — AB (ref <0.03)

## 2017-08-11 LAB — PROCALCITONIN: Procalcitonin: 1.21 ng/mL

## 2017-08-11 MED ORDER — SODIUM CHLORIDE 0.9 % IV BOLUS (SEPSIS)
1000.0000 mL | Freq: Once | INTRAVENOUS | Status: AC
  Administered 2017-08-11: 1000 mL via INTRAVENOUS

## 2017-08-11 MED ORDER — LEVOFLOXACIN IN D5W 750 MG/150ML IV SOLN
750.0000 mg | Freq: Once | INTRAVENOUS | Status: AC
  Administered 2017-08-11: 750 mg via INTRAVENOUS
  Filled 2017-08-11: qty 150

## 2017-08-11 MED ORDER — ONDANSETRON HCL 4 MG/2ML IJ SOLN
4.0000 mg | Freq: Four times a day (QID) | INTRAMUSCULAR | Status: DC | PRN
  Administered 2017-08-11 – 2017-08-14 (×6): 4 mg via INTRAVENOUS
  Filled 2017-08-11 (×6): qty 2

## 2017-08-11 MED ORDER — DEXTROSE 5 % IV SOLN
0.0000 ug/min | Freq: Once | INTRAVENOUS | Status: AC
  Administered 2017-08-11: 2 ug/min via INTRAVENOUS
  Filled 2017-08-11: qty 4

## 2017-08-11 MED ORDER — NOREPINEPHRINE BITARTRATE 1 MG/ML IV SOLN
0.0000 ug/min | INTRAVENOUS | Status: DC
  Administered 2017-08-11: 5 ug/min via INTRAVENOUS
  Administered 2017-08-12: 10 ug/min via INTRAVENOUS
  Administered 2017-08-12: 7 ug/min via INTRAVENOUS
  Administered 2017-08-12: 10 ug/min via INTRAVENOUS
  Administered 2017-08-12: 12 ug/min via INTRAVENOUS
  Administered 2017-08-13: 5 ug/min via INTRAVENOUS
  Filled 2017-08-11 (×4): qty 4

## 2017-08-11 MED ORDER — SODIUM CHLORIDE 0.9 % IV BOLUS (SEPSIS): 1000.0000 mL | Freq: Once | INTRAVENOUS | Status: DC

## 2017-08-11 MED ORDER — CEFEPIME HCL 2 G IJ SOLR
2.0000 g | Freq: Two times a day (BID) | INTRAMUSCULAR | Status: DC
  Administered 2017-08-11 – 2017-08-14 (×7): 2 g via INTRAVENOUS
  Filled 2017-08-11 (×9): qty 2

## 2017-08-11 MED ORDER — DEXTROSE 5 % IV SOLN
2.0000 g | Freq: Once | INTRAVENOUS | Status: AC
  Administered 2017-08-11: 2 g via INTRAVENOUS
  Filled 2017-08-11: qty 2

## 2017-08-11 MED ORDER — MORPHINE SULFATE (PF) 2 MG/ML IV SOLN
2.0000 mg | INTRAVENOUS | Status: DC | PRN
  Administered 2017-08-11 – 2017-08-15 (×8): 2 mg via INTRAVENOUS
  Filled 2017-08-11 (×8): qty 1

## 2017-08-11 MED ORDER — SODIUM CHLORIDE 0.9 % IV SOLN
500.0000 mg | INTRAVENOUS | Status: DC
  Administered 2017-08-12 – 2017-08-14 (×3): 500 mg via INTRAVENOUS
  Filled 2017-08-11 (×5): qty 500

## 2017-08-11 MED ORDER — MORPHINE SULFATE (PF) 2 MG/ML IV SOLN
2.0000 mg | Freq: Once | INTRAVENOUS | Status: AC
  Administered 2017-08-11: 2 mg via INTRAVENOUS
  Filled 2017-08-11: qty 1

## 2017-08-11 MED ORDER — VANCOMYCIN HCL IN DEXTROSE 1-5 GM/200ML-% IV SOLN
1000.0000 mg | Freq: Once | INTRAVENOUS | Status: AC
  Administered 2017-08-11: 1000 mg via INTRAVENOUS
  Filled 2017-08-11: qty 200

## 2017-08-11 MED ORDER — HEPARIN SODIUM (PORCINE) 5000 UNIT/ML IJ SOLN
5000.0000 [IU] | Freq: Three times a day (TID) | INTRAMUSCULAR | Status: DC
  Administered 2017-08-11 – 2017-08-14 (×8): 5000 [IU] via SUBCUTANEOUS
  Filled 2017-08-11 (×9): qty 1

## 2017-08-11 MED ORDER — DOCUSATE SODIUM 100 MG PO CAPS
100.0000 mg | ORAL_CAPSULE | Freq: Two times a day (BID) | ORAL | Status: DC | PRN
  Filled 2017-08-11: qty 1

## 2017-08-11 MED ORDER — POTASSIUM CHLORIDE IN NACL 40-0.9 MEQ/L-% IV SOLN
INTRAVENOUS | Status: AC
  Administered 2017-08-11: 75 mL/h via INTRAVENOUS
  Filled 2017-08-11: qty 1000

## 2017-08-11 MED ORDER — OXYCODONE-ACETAMINOPHEN 5-325 MG PO TABS
1.0000 | ORAL_TABLET | Freq: Four times a day (QID) | ORAL | Status: DC | PRN
  Administered 2017-08-12: 1 via ORAL
  Filled 2017-08-11: qty 1

## 2017-08-11 NOTE — H&P (Addendum)
[facility] [place] NAME: [name]    MR#:  [id number]  DATE OF BIRTH:  Jul 25, 1971  DATE OF ADMISSION:  08/11/2017  PRIMARY CARE PHYSICIAN: [facility]   REQUESTING/REFERRING PHYSICIAN: [place]  CHIEF COMPLAINT:   Chief Complaint  Patient presents with  . Weakness    HISTORY OF PRESENT ILLNESS: [name]  is a 46 y.o. 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 Worsening abd distension and pain- sent for Ascites tap last week- 5 ltr fluid removed, since then- have more pain, nausea, not eating any, have chills- finally brought to ER. Noted to be dehydrated, hypokalemia, hyponatremia, Hypothermia.  ER called for admitting her for these.  PAST MEDICAL HISTORY:   Past Medical History:  Diagnosis Date  . Asthma 2005  . Breast cancer ([place])   . CAD (coronary artery disease)   . Cancer [facility]) 2012   breast cancer, right-treated with neoadjunvant chemotherapy followed by partial mastectomy with sn bx and radiation. Pt is currently taking Tamoxifen since January 2013   . Hypertension 2012  . Lump or mass in breast 2012   right  . Malignant neoplasm of upper-outer quadrant of female breast ([place]) 2012   right  . MI (myocardial infarction) ([place])   . Myocardial infarction ([place]) 10/2012  . Personal history of tobacco use, presenting hazards to health     PAST SURGICAL HISTORY: Past Surgical History:  Procedure Laterality Date  . BREAST LUMPECTOMY Right 2012  . CESAREAN SECTION  [id number]  . CORONARY ANGIOPLASTY WITH STENT PLACEMENT  20123   Myocardial infarction  . FINGER SURGERY Left 1986   reconstruction, left pinky  . LEFT HEART CATHETERIZATION WITH CORONARY ANGIOGRAM N/A 10/16/2012   Procedure: LEFT HEART CATHETERIZATION WITH CORONARY ANGIOGRAM;  Surgeon: Clent Demark, MD;  Location: [facility]  CATH LAB;  Service: Cardiovascular;  Laterality: N/A;  . PORTACATH PLACEMENT    . TUBAL LIGATION  2004  . WRIST SURGERY Left 1987    SOCIAL HISTORY:  Social History  Substance Use Topics  . Smoking status: Current Every Day Smoker    Packs/day: 0.50    Years: 15.00    Types: Cigarettes  . Smokeless tobacco: Never Used  . Alcohol use No    FAMILY HISTORY:  Family History  Problem Relation Age of Onset  . Cancer Maternal Grandmother 69       breast    DRUG ALLERGIES:  Allergies  Allergen Reactions  . Codeine Nausea And Vomiting  . Penicillins Rash    Has patient had a PCN reaction causing immediate rash, facial/tongue/throat swelling, SOB or lightheadedness with hypotension: Unknown Has patient had a PCN reaction causing severe rash involving mucus membranes or skin necrosis: No Has patient had a PCN reaction that required hospitalization: No Has patient had a PCN reaction occurring within the last 10 years: Unknown If all of the above answers are "NO", then may proceed with Cephalosporin use.     REVIEW OF SYSTEMS:   CONSTITUTIONAL: No fever,positive for fatigue or weakness.  EYES: No blurred or double vision.  EARS, NOSE, AND THROAT: No tinnitus or ear pain.  RESPIRATORY: No cough, shortness of breath, wheezing or hemoptysis.  CARDIOVASCULAR: No chest pain, orthopnea, edema.  GASTROINTESTINAL: positive for nausea, vomiting, no diarrhea , have abdominal pain.  GENITOURINARY: No dysuria, hematuria.  ENDOCRINE:  No polyuria, nocturia,  HEMATOLOGY: No anemia, easy bruising or bleeding SKIN: No rash or lesion. MUSCULOSKELETAL: No joint pain or arthritis.   NEUROLOGIC: No tingling, numbness, weakness.  PSYCHIATRY: No anxiety or depression.   MEDICATIONS AT HOME:  Prior to Admission medications   Medication Sig Start Date End Date Taking? Authorizing Provider  albuterol (PROVENTIL HFA;VENTOLIN HFA) 108 (90 Base) MCG/ACT inhaler Inhale 2 puffs into the lungs every 6  (six) hours as needed for wheezing or shortness of breath. 06/03/16  Yes Lloyd Huger, MD  aspirin EC 81 MG EC tablet Take 1 tablet (81 mg total) by mouth daily. 10/19/12  Yes Charolette Forward, MD  letrozole [facility]) 2.5 MG tablet Take 1 tablet (2.5 mg total) by mouth daily. 07/29/17  Yes Lloyd Huger, MD  nitroGLYCERIN (NITROSTAT) 0.4 MG SL tablet Place 1 tablet (0.4 mg total) under the tongue every 5 (five) minutes x 3 doses as needed for chest pain. 07/16/16  Yes Lloyd Huger, MD  ondansetron (ZOFRAN ODT) 8 MG disintegrating tablet Take 1 tablet (8 mg total) by mouth every 8 (eight) hours as needed for nausea or vomiting. 07/01/17  Yes Lloyd Huger, MD  oxyCODONE-acetaminophen (PERCOCET/ROXICET) 5-325 MG tablet Take 1 tablet by mouth at bedtime as needed for severe pain. 07/29/17  Yes Lloyd Huger, MD  palbociclib Leslee Home) 100 MG capsule Take 1 capsule (100 mg total) by mouth daily with breakfast. Take for 21 day, then 7 days off. Take whole with food. 08/02/17  Yes Lloyd Huger, MD  dexamethasone (DECADRON) 4 MG tablet Take 1 tablet (4 mg total) by mouth daily. Patient not taking: Reported on 08/11/2017 07/01/17   Lloyd Huger, MD  potassium chloride SA (K-DUR,KLOR-CON) 20 MEQ tablet Take 1 tablet (20 mEq total) by mouth 2 (two) times daily. Patient not taking: Reported on 07/29/2017 07/02/16   Lloyd Huger, MD  prochlorperazine (COMPAZINE) 10 MG tablet Take 1 tablet (10 mg total) by mouth every 6 (six) hours as needed for nausea or vomiting. Patient not taking: Reported on 08/11/2017 04/16/16   Lloyd Huger, MD      PHYSICAL EXAMINATION:   VITAL SIGNS: Blood pressure 96/81, pulse 97, temperature (!) 94.9 F (34.9 C), temperature source Rectal, resp. rate 18, height 5\' 2"  (1.575 m), weight 36.3 kg (80 lb), SpO2 96 %.  GENERAL:  46 y.o.-year-old cachectic patient lying in the bed with no acute distress. On warming air blanket EYES: Pupils  equal, round, reactive to light and accommodation. No scleral icterus. Extraocular muscles intact.  HEENT: Head atraumatic, normocephalic. Oropharynx and nasopharynx clear.  NECK:  Supple, no jugular venous distention. No thyroid enlargement, no tenderness.  LUNGS: Normal breath sounds bilaterally, no wheezing, rales,rhonchi or crepitation. No use of accessory muscles of respiration. Left upper chest med port. CARDIOVASCULAR: S1, S2 normal. No murmurs, rubs, or gallops.  ABDOMEN: Soft, tender, nondistended. Bowel sounds present. No organomegaly or mass.  EXTREMITIES: No pedal edema, cyanosis, or clubbing.  NEUROLOGIC: Cranial nerves II through XII are intact. Muscle strength 3/5 in all extremities. Sensation intact. Gait not checked.  PSYCHIATRIC: The patient is alert and oriented x 3.  SKIN: No obvious rash, lesion, or ulcer.   LABORATORY PANEL:   CBC  Recent Labs Lab 08/11/17 1240  WBC 6.1  HGB 10.6*  HCT 30.9*  PLT 189  MCV 84.6  MCH 29.0  MCHC 34.3  RDW 17.0*  LYMPHSABS 0.5*  MONOABS 0.3  EOSABS 0.0  BASOSABS 0.0   ------------------------------------------------------------------------------------------------------------------  Chemistries   Recent Labs Lab 08/11/17 1240  NA 123*  K 3.3*  CL 89*  CO2 22  GLUCOSE 66  BUN 14  CREATININE 0.57  CALCIUM 7.6*  AST 131*  ALT 31  ALKPHOS 424*  BILITOT 2.6*   ------------------------------------------------------------------------------------------------------------------ estimated creatinine clearance is 49.3 mL/min (by C-G formula based on SCr of 0.57 mg/dL). ------------------------------------------------------------------------------------------------------------------ No results for input(s): TSH, T4TOTAL, T3FREE, THYROIDAB in the last 72 hours.  Invalid input(s): FREET3   Coagulation profile  Recent Labs Lab 08/11/17 1240  INR 1.11    ------------------------------------------------------------------------------------------------------------------- No results for input(s): DDIMER in the last 72 hours. -------------------------------------------------------------------------------------------------------------------  Cardiac Enzymes  Recent Labs Lab 08/11/17 1240  TROPONINI 0.03*   ------------------------------------------------------------------------------------------------------------------ Invalid input(s): POCBNP  ---------------------------------------------------------------------------------------------------------------  Urinalysis    Component Value Date/Time   COLORURINE YELLOW 04/10/2015 1153   APPEARANCEUR CLOUDY (A) 04/10/2015 1153   LABSPEC 1.025 04/10/2015 1153   PHURINE 5.5 04/10/2015 1153   GLUCOSEU NEGATIVE 04/10/2015 1153   HGBUR NEGATIVE 04/10/2015 1153   BILIRUBINUR NEGATIVE 04/10/2015 1153   KETONESUR NEGATIVE 04/10/2015 1153   PROTEINUR NEGATIVE 04/10/2015 1153   NITRITE NEGATIVE 04/10/2015 1153   LEUKOCYTESUR 1+ (A) 04/10/2015 1153     RADIOLOGY: Dg Chest Port 1 View  Result Date: 08/11/2017 CLINICAL DATA:  Generalized weakness EXAM: PORTABLE CHEST 1 VIEW COMPARISON:  06/21/2017 FINDINGS: Cardiac shadow is within normal limits. Left chest wall port is noted in satisfactory position. The lungs are well aerated bilaterally without focal infiltrate or sizable effusion. No bony abnormality is seen. IMPRESSION: No active disease. Electronically Signed   By: Inez Catalina M.D.   On: 08/11/2017 13:10    EKG: Orders placed or performed during the hospital encounter of 08/11/17  . ED EKG 12-Lead  . ED EKG 12-Lead    IMPRESSION AND PLAN:  * Failure to thrive   Metastatic breast cancer   IV fluids, nausea and pain management   Oncology and Palliative care consult to help with management.  * Hypokalemia   Oral and IV replace.  * Hyponatremia   Due to dehydration   IV  fluids  * Pain and nausea with cancer   IV meds.   Palliative care.  * Hypothermia    On warming air blanket  All the records are reviewed and case discussed with ED provider. Management plans discussed with the patient, family and they are in agreement.  CODE STATUS: DNR Code Status History    Date Active Date Inactive Code Status Order ID Comments User Context   01/23/2015  6:29 PM 01/27/2015  4:05 PM Full Code [id number]  Doree Albee, MD ED     Spoke to 2 daughters in room.  TOTAL TIME TAKING CARE OF THIS PATIENT: 45 critical care minutes.    Vaughan Basta M.D on 08/11/2017   Between 7am to 6pm - Pager - [phone]  After 6pm go to [email or web address] - password [email or web address] [place]  Office  [phone]  CC: Primary care physician; [facility]   Note: This dictation was prepared with Dragon dictation along with smaller phrase technology. Any transcriptional errors that result from this process are unintentional.

## 2017-08-11 NOTE — ED Triage Notes (Signed)
Patient presents to ED via POV from home with daughter with c/o generalized weakness. Patient is a cancer patient. Patient is emaciated and lethargic in triage.

## 2017-08-11 NOTE — ED Notes (Signed)
Pt placed on bear hugger at this time with rectal temp 94

## 2017-08-11 NOTE — Consult Note (Signed)
[facility] Consultation    SYNOPSIS   Metastatic breast cancer with cachexia, now with hypotension, hypothermia, electrolyte abnormalities.   ASSESSMENT/PLAN     Hypotension with septic shock, source uncertain.  Hypothermia, hyponatremia, hypokalemia.  Elevated PCT.  Metastatic Breast cancer with recurrent ascites, chronic abd pain.    --Continue broad spectrum abx for septic shock.  --Phenylephrine to keep MAP>65, Picc line pending.  --Palliative care consult.    Intake/Output Summary (Last 24 hours) at 08/11/17 1813 Last data filed at 08/11/17 1600  Gross per 24 hour  Intake            10.63 ml  Output                0 ml  Net            10.63 ml    Micro/culture results:  BCx2 -- UC -- Sputum--  Antibiotics: Aztreonam x 1  10/4 >>   Levaquin x 1 10/4 >>  Vanc 10/4 >> Cefepime 10/4 >>     Best Practices  DVT Prophylaxis: heparin GI Prophylaxis: --  ---------------------------------------  ---------------------------------------   Name: [name] MRN: [id number] DOB: [birth year]    ADMISSION DATE:  08/11/2017 CONSULTATION DATE:  08/11/17   REFERRING MD :  Dr. 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 She has been having worsening ascites, with poor performance status. Currently she looks emaciated. The patient is lethargic but arousable the patient's family at bedside provides history.she has not been able to eat and drink, she has generalized malaise and fatigue. On arrival to the ED she was noted to have multiple electrolyte abnormalities, hypothermia, hypotension.   PAST MEDICAL HISTORY :  Past Medical History:  Diagnosis Date  . Asthma 2005  . Breast cancer ([place])   . CAD (coronary artery disease)   . Cancer [facility]) 2012   breast cancer, right-treated with neoadjunvant chemotherapy followed by partial mastectomy with sn bx and radiation. Pt is currently taking Tamoxifen since January 2013   . Hypertension 2012  . Lump or mass in breast 2012   right  . Malignant neoplasm of upper-outer quadrant of female breast ([place]) 2012   right  . MI (myocardial infarction) ([place])   . Myocardial infarction ([place]) 10/2012  . Personal history of tobacco use, presenting hazards to health    Past Surgical History:  Procedure Laterality Date  . BREAST LUMPECTOMY Right 2012  . CESAREAN SECTION  [id number]  . CORONARY ANGIOPLASTY WITH STENT PLACEMENT  20123   Myocardial infarction  . FINGER SURGERY Left 1986   reconstruction, left pinky  . LEFT HEART CATHETERIZATION WITH CORONARY ANGIOGRAM N/A 10/16/2012   Procedure: LEFT HEART CATHETERIZATION WITH CORONARY ANGIOGRAM;  Surgeon: Clent Demark, MD;  Location: [facility] CATH [facility];  Service: Cardiovascular;  Laterality: N/A;  . PORTACATH PLACEMENT    . TUBAL LIGATION  2004  . WRIST SURGERY Left 1987   Prior to Admission medications   Medication Sig Start Date End Date Taking? Authorizing Provider  albuterol (PROVENTIL HFA;VENTOLIN HFA) 108 (90 Base) MCG/ACT inhaler Inhale 2 puffs into the lungs every 6 (six) hours as needed for wheezing or shortness of breath. 06/03/16  Yes Lloyd Huger, MD  aspirin EC 81 MG EC tablet Take 1 tablet (81 mg  total) by mouth daily. 10/19/12  Yes Charolette Forward, MD  letrozole [facility]) 2.5 MG tablet Take 1 tablet (2.5 mg total) by mouth daily. 07/29/17  Yes Lloyd Huger, MD  nitroGLYCERIN (NITROSTAT) 0.4 MG SL tablet Place 1 tablet (0.4 mg total) under the tongue every 5 (five) minutes x 3 doses as needed for chest pain. 07/16/16  Yes Lloyd Huger, MD  ondansetron (ZOFRAN ODT) 8 MG disintegrating tablet Take 1 tablet (8 mg total) by mouth every 8 (eight) hours as needed for nausea or vomiting. 07/01/17  Yes Lloyd Huger, MD    oxyCODONE-acetaminophen (PERCOCET/ROXICET) 5-325 MG tablet Take 1 tablet by mouth at bedtime as needed for severe pain. 07/29/17  Yes Lloyd Huger, MD  palbociclib [facility]) 100 MG capsule Take 1 capsule (100 mg total) by mouth daily with breakfast. Take for 21 day, then 7 days off. Take whole with food. 08/02/17  Yes Lloyd Huger, MD  dexamethasone (DECADRON) 4 MG tablet Take 1 tablet (4 mg total) by mouth daily. Patient not taking: Reported on 08/11/2017 07/01/17   Lloyd Huger, MD  potassium chloride SA (K-DUR,KLOR-CON) 20 MEQ tablet Take 1 tablet (20 mEq total) by mouth 2 (two) times daily. Patient not taking: Reported on 07/29/2017 07/02/16   Lloyd Huger, MD  prochlorperazine (COMPAZINE) 10 MG tablet Take 1 tablet (10 mg total) by mouth every 6 (six) hours as needed for nausea or vomiting. Patient not taking: Reported on 08/11/2017 04/16/16   Lloyd Huger, MD   Allergies  Allergen Reactions  . Codeine Nausea And Vomiting  . Penicillins Rash    Has patient had a PCN reaction causing immediate rash, facial/tongue/throat swelling, SOB or lightheadedness with hypotension: Unknown Has patient had a PCN reaction causing severe rash involving mucus membranes or skin necrosis: No Has patient had a PCN reaction that required hospitalization: No Has patient had a PCN reaction occurring within the last 10 years: Unknown If all of the above answers are "NO", then may proceed with Cephalosporin use.     FAMILY HISTORY:  Family History  Problem Relation Age of Onset  . Cancer Maternal Grandmother 31       breast   SOCIAL HISTORY:  reports that she has been smoking Cigarettes.  She has a 7.50 pack-year smoking history. She has never used smokeless tobacco. She reports that she does not drink alcohol or use drugs.  REVIEW OF SYSTEMS:   Lethargic, could not provide.    VITAL SIGNS: Temp:  [94.9 F (34.9 C)-98.8 F (37.1 C)] 98.8 F (37.1 C) (10/04 1503) Pulse  Rate:  [82-97] 95 (10/04 1530) Resp:  [15-22] 18 (10/04 1530) BP: (64-119)/(54-92) 92/72 (10/04 1604) SpO2:  [96 %-100 %] 98 % (10/04 1604) Weight:  [80 lb (36.3 kg)-93 lb 11.1 oz (42.5 kg)] 93 lb 11.1 oz (42.5 kg) (10/04 1604) HEMODYNAMICS:     Physical Examination:   VS: BP 92/72   Pulse 95   Temp 98.8 F (37.1 C) (Rectal)   Resp 18   Ht 5\' 2"  (1.575 m)   Wt 93 lb 11.1 oz (42.5 kg)   SpO2 98%   BMI 17.14 kg/m   General Appearance: No distress  Neuro:without focal findings, mental status reduced but arousable.  HEENT: PERRLA, EOM intact, no ptosis, no other lesions noticed;  Pulmonary: normal breath sounds., diaphragmatic excursion normal. CardiovascularNormal S1,S2.  No m/r/g.    Abdomen: Benign, Soft, non-tender, No masses, hepatosplenomegaly, No lymphadenopathy Renal:  No costovertebral  tenderness  GU:  Not performed at this time. Endoc: No evident thyromegaly, no signs of acromegaly. Skin:   warm, no rashes, no ecchymosis  Extremities: normal, no cyanosis, clubbing, no edema, warm with normal capillary refill.    LABS: Reviewed   LABORATORY PANEL:   CBC  Recent Labs Lab 08/11/17 1240  WBC 6.1  HGB 10.6*  HCT 30.9*  PLT 189    Chemistries   Recent Labs Lab 08/11/17 1240  NA 123*  K 3.3*  CL 89*  CO2 22  GLUCOSE 66  BUN 14  CREATININE 0.57  CALCIUM 7.6*  AST 131*  ALT 31  ALKPHOS 424*  BILITOT 2.6*     Recent Labs Lab 08/11/17 1606  GLUCAP 65   No results for input(s): PHART, PCO2ART, PO2ART in the last 168 hours.  Recent Labs Lab 08/11/17 1240  AST 131*  ALT 31  ALKPHOS 424*  BILITOT 2.6*  ALBUMIN 2.7*    Cardiac Enzymes  Recent Labs Lab 08/11/17 1240  TROPONINI 0.03*    RADIOLOGY:  Dg Chest Port 1 View  Result Date: 08/11/2017 CLINICAL DATA:  Generalized weakness EXAM: PORTABLE CHEST 1 VIEW COMPARISON:  06/21/2017 FINDINGS: Cardiac shadow is within normal limits. Left chest wall port is noted in satisfactory  position. The lungs are well aerated bilaterally without focal infiltrate or sizable effusion. No bony abnormality is seen. IMPRESSION: No active disease. Electronically Signed   By: Inez Catalina M.D.   On: 08/11/2017 13:10       --Deep Ashby Dawes, MD.  Board Certified in Internal Medicine, Pulmonary Medicine, [place], and Sleep Medicine.  ICU Pager [phone] [facility] Office Number: [phone]  Patricia Pesa, M.D.  Merton Border, M.D   08/11/2017, 6:13 PM

## 2017-08-11 NOTE — Progress Notes (Signed)
Family Meeting Note  Advance Directive:no  Today a meeting took place with the Patient and 2 daughters.  The following clinical team members were present during this meeting:MD  The following were discussed:Patient's diagnosis: metastatic cancer, pain, Failure to thrive, Hyponatremia, Patient's progosis: < 12 months and Goals for treatment: DNR  Additional follow-up to be provided: palliative care and oncology consult to discuss further plans.  Time spent during discussion:20 minutes  Hiep Ollis, Rosalio Macadamia, MD

## 2017-08-11 NOTE — ED Provider Notes (Addendum)
[facility] Emergency Department Provider Note  ____________________________________________   First MD Initiated Contact with Patient 08/11/17 1223     (approximate)  I have reviewed the triage vital signs and the nursing notes.   HISTORY  Chief Complaint Weakness  level V exemption history Limited by the patient's clinical condition  HPI [name] is a 46 y.o. female who is brought to the emergencydepartment by her daughter for generalized malaise fatigue and inability to eat and drink. The patient has a complex past medical history including stage IV breast cancer for which she has received minimal treatment. She has not had any surgery and she is not had chemotherapy in over a year. She did have a recent paracentesis which seemed to help her symptoms. According to the daughter the patient cannot keep any food down. She's had subjective fevers and chills. No cough.   Past Medical History:  Diagnosis Date  . Asthma 2005  . Breast cancer ([place])   . CAD (coronary artery disease)   . Cancer [facility]) 2012   breast cancer, right-treated with neoadjunvant chemotherapy followed by partial mastectomy with sn bx and radiation. Pt is currently taking Tamoxifen since January 2013   . Hypertension 2012  . Lump or mass in breast 2012   right  . Malignant neoplasm of upper-outer quadrant of female breast ([place]) 2012   right  . MI (myocardial infarction) ([place])   . Myocardial infarction ([place]) 10/2012  . Personal history of tobacco use, presenting hazards to health     Patient Active Problem List   Diagnosis Date Noted  . Failure to thrive in adult 08/11/2017  . Hypothermia 08/11/2017  . Hyponatremia 08/11/2017  . Hypokalemia 08/11/2017  . Goals of care, counseling/discussion 12/10/2016  . Asthma 05/21/2016  . CAD (coronary artery disease) 05/21/2016  . Hypertension 05/21/2016  . Lobular breast cancer ([place]) 04/09/2015  . Breast cancer ([place]) 03/06/2015  .  Ascites, malignant 02/26/2015  . Acute respiratory failure with hypoxia ([place]) 01/24/2015  . Community acquired pneumonia 01/23/2015  . Malignant neoplasm of upper-outer quadrant of left female breast ([place])   . Myocardial infarction ([place]) 10/08/2012    Past Surgical History:  Procedure Laterality Date  . BREAST LUMPECTOMY Right 2012  . CESAREAN SECTION  [id number]  . CORONARY ANGIOPLASTY WITH STENT PLACEMENT  20123   Myocardial infarction  . FINGER SURGERY Left 1986   reconstruction, left pinky  . LEFT HEART CATHETERIZATION WITH CORONARY ANGIOGRAM N/A 10/16/2012   Procedure: LEFT HEART CATHETERIZATION WITH CORONARY ANGIOGRAM;  Surgeon: Clent Demark, MD;  Location: [facility] CATH LAB;  Service: Cardiovascular;  Laterality: N/A;  . PORTACATH PLACEMENT    . TUBAL LIGATION  2004  . WRIST SURGERY Left 1987    Prior to Admission medications   Medication Sig Start Date End Date Taking? Authorizing Provider  albuterol (PROVENTIL HFA;VENTOLIN HFA) 108 (90 Base) MCG/ACT inhaler Inhale 2 puffs into the lungs every 6 (six) hours as needed for wheezing or shortness of breath. 06/03/16  Yes Lloyd Huger, MD  aspirin EC 81 MG EC tablet Take 1 tablet (81 mg total) by mouth daily. 10/19/12  Yes Charolette Forward, MD  letrozole [facility]) 2.5 MG tablet Take 1 tablet (2.5 mg total) by mouth daily. 07/29/17  Yes Lloyd Huger, MD  nitroGLYCERIN (NITROSTAT) 0.4 MG SL tablet Place 1 tablet (0.4 mg total) under the tongue every 5 (five) minutes x 3 doses as needed for chest pain. 07/16/16  Yes Delight Hoh  J, MD  ondansetron (ZOFRAN ODT) 8 MG disintegrating tablet Take 1 tablet (8 mg total) by mouth every 8 (eight) hours as needed for nausea or vomiting. 07/01/17  Yes Lloyd Huger, MD  oxyCODONE-acetaminophen (PERCOCET/ROXICET) 5-325 MG tablet Take 1 tablet by mouth at bedtime as needed for severe pain. 07/29/17  Yes Lloyd Huger, MD  palbociclib [facility]) 100 MG capsule Take 1 capsule (100 mg  total) by mouth daily with breakfast. Take for 21 day, then 7 days off. Take whole with food. 08/02/17  Yes Lloyd Huger, MD  dexamethasone (DECADRON) 4 MG tablet Take 1 tablet (4 mg total) by mouth daily. Patient not taking: Reported on 08/11/2017 07/01/17   Lloyd Huger, MD  potassium chloride SA (K-DUR,KLOR-CON) 20 MEQ tablet Take 1 tablet (20 mEq total) by mouth 2 (two) times daily. Patient not taking: Reported on 07/29/2017 07/02/16   Lloyd Huger, MD  prochlorperazine (COMPAZINE) 10 MG tablet Take 1 tablet (10 mg total) by mouth every 6 (six) hours as needed for nausea or vomiting. Patient not taking: Reported on 08/11/2017 04/16/16   Lloyd Huger, MD    Allergies Codeine and Penicillins  Family History  Problem Relation Age of Onset  . Cancer Maternal Grandmother 41       breast    Social History Social History  Substance Use Topics  . Smoking status: Current Every Day Smoker    Packs/day: 0.50    Years: 15.00    Types: Cigarettes  . Smokeless tobacco: Never Used  . Alcohol use No    Review of Systems level V exemption history Limited by the patient's clinical condition  ____________________________________________   PHYSICAL EXAM:  VITAL SIGNS: ED Triage Vitals [08/11/17 1217]  Enc Vitals Group     BP 95/71     Pulse Rate 97     Resp 15     Temp (!) 94.9 F (34.9 C)     Temp Source Rectal     SpO2 96 %     Weight 80 lb (36.3 kg)     Height 5\' 2"  (1.575 m)     Head Circumference      Peak Flow      Pain Score 0     Pain Loc      Pain Edu?      Excl. in University?     Constitutional: cachectic malnourished chronically ill-appearing Eyes: PERRL EOMI. Head: Atraumatic. Nose: No congestion/rhinnorhea. Mouth/Throat: No trismus Neck: No stridor.   Cardiovascular: Normal rate, regular rhythm. Grossly normal heart sounds.  Good peripheral circulation. port in place to the left upper chest Respiratory: Normal respiratory effort.  No  retractions. Lungs CTAB and moving good air Gastrointestinal: distended abdomen soft mild diffuse tenderness without focality Musculoskeletal: No lower extremity edema   Neurologic:   No gross focal neurologic deficits are appreciated. Skin:  Skin is warm, dry and intact. No rash noted. Psychiatric: flat affect    ____________________________________________   DIFFERENTIAL includes but not limited to  sepsis, pneumonia, urinary tract infection, metabolic derangement ____________________________________________   LABS (all labs ordered are listed, but only abnormal results are displayed)  Labs Reviewed  COMPREHENSIVE METABOLIC PANEL - Abnormal; Notable for the following:       Result Value   Sodium 123 (*)    Potassium 3.3 (*)    Chloride 89 (*)    Calcium 7.6 (*)    Total Protein 5.7 (*)    Albumin 2.7 (*)  AST 131 (*)    Alkaline Phosphatase 424 (*)    Total Bilirubin 2.6 (*)    All other components within normal limits  TROPONIN I - Abnormal; Notable for the following:    Troponin I 0.03 (*)    All other components within normal limits  CBC WITH DIFFERENTIAL/PLATELET - Abnormal; Notable for the following:    RBC 3.66 (*)    Hemoglobin 10.6 (*)    HCT 30.9 (*)    RDW 17.0 (*)    Lymphs Abs 0.5 (*)    All other components within normal limits  BLOOD GAS, VENOUS - Abnormal; Notable for the following:    pO2, Ven <31.0 (*)    All other components within normal limits  CULTURE, BLOOD (ROUTINE X 2)  CULTURE, BLOOD (ROUTINE X 2)  URINE CULTURE  LACTIC ACID, PLASMA  LIPASE, BLOOD  PROCALCITONIN  PROTIME-INR  LACTIC ACID, PLASMA  URINALYSIS, ROUTINE W REFLEX MICROSCOPIC    blood work reviewed and interpreted by me shows a number of abnormalities most notably hypochloremic hyponatremia. She also is malnourished with an albumin of 2.7 __________________________________________  EKG  ED ECG REPORT I, Darel Hong, the attending physician, personally viewed and  interpreted this ECG.  Date: 08/11/2017 EKG Time:  Rate: 94 Rhythm: paced rhythm QRS Axis: normal Intervals: normal ST/T Wave abnormalities: normal Narrative Interpretation: no evidence of acute ischemia  ____________________________________________  RADIOLOGY  chest x-ray reviewed by me no acute disease ____________________________________________   PROCEDURES  Procedure(s) performed: no  Procedures  Critical Care performed: yes  CRITICAL CARE Performed by: Darel Hong   Total critical care time: 35 minutes  Critical care time was exclusive of separately billable procedures and treating other patients.  Critical care was necessary to treat or prevent imminent or life-threatening deterioration.  Critical care was time spent personally by me on the following activities: development of treatment plan with patient and/or surrogate as well as nursing, discussions with consultants, evaluation of patient's response to treatment, examination of patient, obtaining history from patient or surrogate, ordering and performing treatments and interventions, ordering and review of laboratory studies, ordering and review of radiographic studies, pulse oximetry and re-evaluation of patient's condition.   Observation: no ____________________________________________   INITIAL IMPRESSION / ASSESSMENT AND PLAN / ED COURSE  Pertinent labs & imaging results that were available during my care of the patient were reviewed by me and considered in my medical decision making (see chart for details).  Rhe patient arrives cachectic and chronically ill-appearing. Differential is broad but given her hypothermia concerned she actually may be septic. Sepsis protocol initiated.  The patient is a number of severe metabolic abnormalities including hypochloremic hyponatremia along with hypoalbuminemia suggestive of starvation. No clear source for infection however her pro calcitonin is elevated  concerning for sepsis as well. At this point she requires inpatient admission for intravenous antibiotics, intravenous fluid support, likely intravenous nutrition, and for cultures to come back.      __----------------------------------------- 2:28 PM on 08/11/2017 -----------------------------------------  The patient's blood pressure dropped to 64/54 concerning for septic shock. Norepinephrine initiated.  Hospitalist updated.__________________________________________   FINAL CLINICAL IMPRESSION(S) / ED DIAGNOSES  Final diagnoses:  Failure to thrive in adult  Hyponatremia  Septic shock (HCC)      NEW MEDICATIONS STARTED DURING THIS VISIT:  New Prescriptions   No medications on file     Note:  This document was prepared using Dragon voice recognition software and may include unintentional dictation errors.  Darel Hong, MD 08/11/17 1420    Darel Hong, MD 08/11/17 1429    Darel Hong, MD 08/11/17 1430

## 2017-08-11 NOTE — ED Notes (Signed)
Foley attempted at this time but unsuccessful

## 2017-08-11 NOTE — Progress Notes (Signed)
Pharmacy Antibiotic Note  [name] is a 46 y.o. female admitted on 08/11/2017 with sepsis.  Pharmacy has been consulted for Vancomycin and Cefepime dosing.  Hx Breast CA w/ mets, on oral chemo.  Plan: Patient received Vancomycin 1 gram IV x 1 in ER and Aztreonam 2 gram x 1 in ER and Levaquin 750mg  IV x1 in ER  Vancomycin 500 IV every 18 hours.  Goal trough 15-20 mcg/mL.  Ke 0.045  T1/2 15.4  Vd 25.4  Scr 0.57 (used 0.80)   Wt= 36.3 kg  -Cefepime 2 gram IV q12h for Crcl 49 ml/min   Height: 5\' 2"  (157.5 cm) Weight: 80 lb (36.3 kg) IBW/kg (Calculated) : 50.1  Temp (24hrs), Avg:94.9 F (34.9 C), Min:94.9 F (34.9 C), Max:94.9 F (34.9 C)   Recent Labs Lab 08/11/17 1240  WBC 6.1  CREATININE 0.57  LATICACIDVEN 1.0    Estimated Creatinine Clearance: 49.3 mL/min (by C-G formula based on SCr of 0.57 mg/dL).    Allergies  Allergen Reactions  . Codeine Nausea And Vomiting  . Penicillins Rash    Has patient had a PCN reaction causing immediate rash, facial/tongue/throat swelling, SOB or lightheadedness with hypotension: Unknown Has patient had a PCN reaction causing severe rash involving mucus membranes or skin necrosis: No Has patient had a PCN reaction that required hospitalization: No Has patient had a PCN reaction occurring within the last 10 years: Unknown If all of the above answers are "NO", then may proceed with Cephalosporin use.     Antimicrobials this admission: Aztreonam x 1  10/4 >>   Levaquin x 1 10/4 >>  Vanc 10/4 >> Cefepime 10/4 >>   Dose adjustments this admission:    Microbiology results: 10/4 BCx: pend 10/4 UCx: pend    Sputum:      MRSA PCR:   CXR neg  Thank you for allowing pharmacy to be a part of this patient's care.  Casy Brunetto A 08/11/2017 2:50 PM

## 2017-08-11 NOTE — Progress Notes (Signed)
Pt was found to have hypotension in ER now.  Her WBCs are not high, but with Hypothermia and hypotension, and being a cancer pt- she is high risk for sepsis/  * Sepsis   IV Vanc + cefepime   IV fluid bolus   IV norepinephrine   Monitor in stepdown unit.  I again discussed with both daughters in room, explained about these new plans. They agree for now. Would like to give her a trial of Abx, fluids and if she does not improve much, then they will like to make her comfortable in 1-2 days.  Additional critical care time spent 20 min.

## 2017-08-11 NOTE — ED Notes (Signed)
Family at bedside. 

## 2017-08-11 NOTE — ED Notes (Signed)
Bear hugger taken off at this time , temp 98.8

## 2017-08-12 ENCOUNTER — Inpatient Hospital Stay: Payer: Medicaid Other

## 2017-08-12 DIAGNOSIS — Z9011 Acquired absence of right breast and nipple: Secondary | ICD-10-CM

## 2017-08-12 DIAGNOSIS — R180 Malignant ascites: Secondary | ICD-10-CM

## 2017-08-12 DIAGNOSIS — Z7189 Other specified counseling: Secondary | ICD-10-CM

## 2017-08-12 DIAGNOSIS — C50412 Malignant neoplasm of upper-outer quadrant of left female breast: Secondary | ICD-10-CM

## 2017-08-12 DIAGNOSIS — Z9221 Personal history of antineoplastic chemotherapy: Secondary | ICD-10-CM

## 2017-08-12 DIAGNOSIS — E43 Unspecified severe protein-calorie malnutrition: Secondary | ICD-10-CM | POA: Insufficient documentation

## 2017-08-12 DIAGNOSIS — C7951 Secondary malignant neoplasm of bone: Secondary | ICD-10-CM

## 2017-08-12 DIAGNOSIS — F17210 Nicotine dependence, cigarettes, uncomplicated: Secondary | ICD-10-CM

## 2017-08-12 DIAGNOSIS — Z515 Encounter for palliative care: Secondary | ICD-10-CM

## 2017-08-12 DIAGNOSIS — Z170 Estrogen receptor positive status [ER+]: Secondary | ICD-10-CM

## 2017-08-12 DIAGNOSIS — R630 Anorexia: Secondary | ICD-10-CM

## 2017-08-12 DIAGNOSIS — C50919 Malignant neoplasm of unspecified site of unspecified female breast: Secondary | ICD-10-CM

## 2017-08-12 DIAGNOSIS — C786 Secondary malignant neoplasm of retroperitoneum and peritoneum: Secondary | ICD-10-CM

## 2017-08-12 DIAGNOSIS — M25511 Pain in right shoulder: Secondary | ICD-10-CM

## 2017-08-12 DIAGNOSIS — R197 Diarrhea, unspecified: Secondary | ICD-10-CM

## 2017-08-12 DIAGNOSIS — Z923 Personal history of irradiation: Secondary | ICD-10-CM

## 2017-08-12 DIAGNOSIS — R112 Nausea with vomiting, unspecified: Secondary | ICD-10-CM

## 2017-08-12 LAB — CBC
HEMATOCRIT: 25.2 % — AB (ref 35.0–47.0)
Hemoglobin: 8.7 g/dL — ABNORMAL LOW (ref 12.0–16.0)
MCH: 29.1 pg (ref 26.0–34.0)
MCHC: 34.3 g/dL (ref 32.0–36.0)
MCV: 84.9 fL (ref 80.0–100.0)
Platelets: 164 10*3/uL (ref 150–440)
RBC: 2.97 MIL/uL — ABNORMAL LOW (ref 3.80–5.20)
RDW: 17.3 % — AB (ref 11.5–14.5)
WBC: 13 10*3/uL — ABNORMAL HIGH (ref 3.6–11.0)

## 2017-08-12 LAB — MAGNESIUM: Magnesium: 1.5 mg/dL — ABNORMAL LOW (ref 1.7–2.4)

## 2017-08-12 LAB — URINE CULTURE: CULTURE: NO GROWTH

## 2017-08-12 LAB — BASIC METABOLIC PANEL
ANION GAP: 8 (ref 5–15)
BUN: 11 mg/dL (ref 6–20)
CO2: 21 mmol/L — AB (ref 22–32)
Calcium: 6.4 mg/dL — CL (ref 8.9–10.3)
Chloride: 95 mmol/L — ABNORMAL LOW (ref 101–111)
Creatinine, Ser: 0.68 mg/dL (ref 0.44–1.00)
Glucose, Bld: 69 mg/dL (ref 65–99)
Potassium: 3.5 mmol/L (ref 3.5–5.1)
SODIUM: 124 mmol/L — AB (ref 135–145)

## 2017-08-12 LAB — ALBUMIN: ALBUMIN: 2 g/dL — AB (ref 3.5–5.0)

## 2017-08-12 LAB — CORTISOL: CORTISOL PLASMA: 11.9 ug/dL

## 2017-08-12 MED ORDER — OXYCODONE-ACETAMINOPHEN 5-325 MG PO TABS: 1.0000 | ORAL_TABLET | ORAL | Status: DC | PRN

## 2017-08-12 MED ORDER — ALUM & MAG HYDROXIDE-SIMETH 200-200-20 MG/5ML PO SUSP
30.0000 mL | ORAL | Status: DC | PRN
  Filled 2017-08-12: qty 30

## 2017-08-12 MED ORDER — HYDROCORTISONE NA SUCCINATE PF 100 MG IJ SOLR
100.0000 mg | Freq: Once | INTRAMUSCULAR | Status: AC
  Administered 2017-08-12: 100 mg via INTRAVENOUS
  Filled 2017-08-12: qty 2

## 2017-08-12 MED ORDER — ALBUMIN HUMAN 25 % IV SOLN
25.0000 g | Freq: Once | INTRAVENOUS | Status: AC
  Administered 2017-08-12: 25 g via INTRAVENOUS
  Filled 2017-08-12: qty 100

## 2017-08-12 MED ORDER — HYDROCERIN EX CREA
TOPICAL_CREAM | Freq: Two times a day (BID) | CUTANEOUS | Status: DC
  Administered 2017-08-12 – 2017-08-13 (×3): 1 via TOPICAL
  Administered 2017-08-14: 22:00:00 via TOPICAL
  Filled 2017-08-12 (×2): qty 113

## 2017-08-12 MED ORDER — ENSURE ENLIVE PO LIQD
237.0000 mL | Freq: Three times a day (TID) | ORAL | Status: DC
  Administered 2017-08-13 – 2017-08-15 (×4): 237 mL via ORAL

## 2017-08-12 MED ORDER — ALBUMIN HUMAN 5 % IV SOLN
25.0000 g | Freq: Once | INTRAVENOUS | Status: DC
  Filled 2017-08-12: qty 500

## 2017-08-12 MED ORDER — MAGNESIUM SULFATE 2 GM/50ML IV SOLN
2.0000 g | Freq: Once | INTRAVENOUS | Status: AC
  Administered 2017-08-12: 2 g via INTRAVENOUS
  Filled 2017-08-12: qty 50

## 2017-08-12 MED ORDER — SENNA 8.6 MG PO TABS
1.0000 | ORAL_TABLET | Freq: Every day | ORAL | Status: DC
  Administered 2017-08-14: 22:00:00 8.6 mg via ORAL
  Filled 2017-08-12 (×2): qty 1

## 2017-08-12 MED ORDER — PANTOPRAZOLE SODIUM 40 MG PO TBEC
40.0000 mg | DELAYED_RELEASE_TABLET | Freq: Every day | ORAL | Status: DC
Start: 2017-08-12 — End: 2017-08-15
  Administered 2017-08-12 – 2017-08-15 (×3): 40 mg via ORAL
  Filled 2017-08-12 (×4): qty 1

## 2017-08-12 MED ORDER — LORAZEPAM 2 MG/ML PO CONC
0.5000 mg | Freq: Three times a day (TID) | ORAL | Status: DC | PRN
  Administered 2017-08-13 – 2017-08-15 (×4): 0.5 mg via ORAL
  Filled 2017-08-12 (×4): qty 1

## 2017-08-12 NOTE — Progress Notes (Addendum)
Pharmacy Antibiotic Note  [name] is a 46 y.o. female admitted on 08/11/2017 with sepsis.  Pharmacy has been consulted for Vancomycin and Cefepime dosing.  Hx Breast CA w/ mets, on oral chemo.  Plan: Will continue vancomycin 500mg  IV Q18hr for goal trough of 15-20. Will obtain trough prior to dose on 10/7. Will continue to monitor renal function with am labs on 10/6.   Will continue cefepime 2g IV Q12hr.   Height: 5\' 2"  (157.5 cm) Weight: 93 lb 11.1 oz (42.5 kg) IBW/kg (Calculated) : 50.1  Temp (24hrs), Avg:98.2 F (36.8 C), Min:98 F (36.7 C), Max:98.4 F (36.9 C)   Recent Labs Lab 08/11/17 1240 08/12/17 0431  WBC 6.1 13.0*  CREATININE 0.57 0.68  LATICACIDVEN 1.0  --     Estimated Creatinine Clearance: 57.7 mL/min (by C-G formula based on SCr of 0.68 mg/dL).    Allergies  Allergen Reactions  . Codeine Nausea And Vomiting  . Penicillins Rash    Has patient had a PCN reaction causing immediate rash, facial/tongue/throat swelling, SOB or lightheadedness with hypotension: Unknown Has patient had a PCN reaction causing severe rash involving mucus membranes or skin necrosis: No Has patient had a PCN reaction that required hospitalization: No Has patient had a PCN reaction occurring within the last 10 years: Unknown If all of the above answers are "NO", then may proceed with Cephalosporin use.     Antimicrobials this admission: Aztreonam x 1  10/4  Levaquin x 1 10/4 Vancomycin 10/4 >> Cefepime 10/4 >>   Dose adjustments this admission: N/A  Microbiology results: 10/4 BCx: no growth < 12 hours  10/4 UCx: no growth  10/4 MRSA PCR:  Negative    Thank you for allowing pharmacy to be a part of this patient's care.  Simpson,Michael L 08/12/2017 9:58 PM

## 2017-08-12 NOTE — Progress Notes (Signed)
Patient complained for sacral pain 8/10 unrelieved by non-pharmacological interventions (back rub, turn and repositioning). Patient was educated and encouraged to allow turing and repositioning as these interventions help relieve pressure and prevent pressure related bed sore. Patient agreed to turning and repositioning. Will continue to monitor and endorse.

## 2017-08-12 NOTE — Progress Notes (Signed)
[facility] NAME: [name]    MR#:  [id number]  DATE OF BIRTH:  30-Oct-1971  SUBJECTIVE:  Came in feeling weak abdominal pain and was hypertensive with hypothermia  REVIEW OF SYSTEMS:   Review of Systems  Constitutional: Positive for weight loss. Negative for chills and fever.  HENT: Negative for ear discharge, ear pain and nosebleeds.   Eyes: Negative for blurred vision, pain and discharge.  Respiratory: Negative for sputum production, shortness of breath, wheezing and stridor.   Cardiovascular: Negative for chest pain, palpitations, orthopnea and PND.  Gastrointestinal: Positive for abdominal pain and nausea. Negative for diarrhea and vomiting.  Genitourinary: Negative for frequency and urgency.  Musculoskeletal: Negative for back pain and joint pain.  Neurological: Positive for weakness. Negative for sensory change, speech change and focal weakness.  Psychiatric/Behavioral: Negative for depression and hallucinations. The patient is not nervous/anxious.    Tolerating Diet:poor Tolerating PT: pending  DRUG ALLERGIES:   Allergies  Allergen Reactions  . Codeine Nausea And Vomiting  . Penicillins Rash    Has patient had a PCN reaction causing immediate rash, facial/tongue/throat swelling, SOB or lightheadedness with hypotension: Unknown Has patient had a PCN reaction causing severe rash involving mucus membranes or skin necrosis: No Has patient had a PCN reaction that required hospitalization: No Has patient had a PCN reaction occurring within the last 10 years: Unknown If all of the above answers are "NO", then may proceed with Cephalosporin use.     VITALS:  Blood pressure 91/66, pulse 76, temperature 98 F (36.7 C), temperature source Axillary, resp. rate 17, height 5\' 2"  (1.575 m), weight 42.5 kg (93 lb 11.1 oz), SpO2 96 %.  PHYSICAL EXAMINATION:   Physical Exam  GENERAL:  46 y.o.-year-old patient lying in  the bed with no acute distress. Thin cachectic EYES: Pupils equal, round, reactive to light and accommodation. No scleral icterus. Extraocular muscles intact.  HEENT: Head atraumatic, normocephalic. Oropharynx and nasopharynx clear.  NECK:  Supple, no jugular venous distention. No thyroid enlargement, no tenderness.  LUNGS: Normal breath sounds bilaterally, no wheezing, rales, rhonchi. No use of accessory muscles of respiration.  CARDIOVASCULAR: S1, S2 normal. No murmurs, rubs, or gallops.  ABDOMEN: Soft, nontender, nondistended. Bowel sounds present. No organomegaly or mass.  EXTREMITIES: No cyanosis, clubbing or edema b/l.    NEUROLOGIC: Cranial nerves II through XII are intact. No focal Motor or sensory deficits b/l.   PSYCHIATRIC:  patient is alert and oriented x 3.  SKIN: No obvious rash, lesion, or ulcer. Pt is very skinny!!  LABORATORY PANEL:  CBC  Recent Labs Lab 08/12/17 0431  WBC 13.0*  HGB 8.7*  HCT 25.2*  PLT 164    Chemistries   Recent Labs Lab 08/11/17 1240 08/12/17 0431  NA 123* 124*  K 3.3* 3.5  CL 89* 95*  CO2 22 21*  GLUCOSE 66 69  BUN 14 11  CREATININE 0.57 0.68  CALCIUM 7.6* 6.4*  MG  --  1.5*  AST 131*  --   ALT 31  --   ALKPHOS 424*  --   BILITOT 2.6*  --    Cardiac Enzymes  Recent Labs Lab 08/11/17 1240  TROPONINI 0.03*   RADIOLOGY:  Ct Abdomen Pelvis Wo Contrast  Result Date: 08/12/2017 CLINICAL DATA:  Hypotension.  Septic shock. EXAM: CT ABDOMEN AND PELVIS WITHOUT CONTRAST TECHNIQUE: Multidetector CT imaging of the abdomen and pelvis was performed following the standard protocol without IV  contrast. COMPARISON:  06/21/2017 FINDINGS: Lower chest: Small bilateral pleural effusions. Dependent atelectasis. Esophagus is mildly distended. Hepatobiliary: Several ill-defined low-density lesions are scattered throughout the liver worrisome for metastatic disease. Gallbladder is decompressed. Pancreas: Unremarkable Spleen: Calcified granulomata.  Adrenals/Urinary Tract: Kidneys and adrenal glands are unremarkable. Bladder is decompressed by a Foley catheter Stomach/Bowel: Stomach is unremarkable. Enteric contrast is present throughout the colon. Colon is relatively decompressed. No gas and fluid filled loops of small bowel are noted. Vascular/Lymphatic: Extensive atherosclerotic calcifications of the aorta and iliac arteries. No evidence of aortic aneurysm. Reproductive: Uterus is absent.  Adnexa and not clearly visualized. Other: Large amount of ascites is present. The posterior peritoneum Ms. thickened in the dependent portion of the pelvis worrisome for peritoneal implants. There is no evidence of omental caking. Musculoskeletal: Scattered tiny sclerotic lesions are seen throughout the visualized spine and pelvis. No vertebral compression deformity IMPRESSION: Advanced metastatic disease is suggested throughout the liver. This should be confirmed with a contrast-enhanced study Prominent ascites. 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 Bilateral pleural effusions with dependent atelectasis. Electronically Signed   By: Marybelle Killings M.D.   On: 08/12/2017 11:29   Dg Chest Port 1 View  Result Date: 08/11/2017 CLINICAL DATA:  Generalized weakness EXAM: PORTABLE CHEST 1 VIEW COMPARISON:  06/21/2017 FINDINGS: Cardiac shadow is within normal limits. Left chest wall port is noted in satisfactory position. The lungs are well aerated bilaterally without focal infiltrate or sizable effusion. No bony abnormality is seen. IMPRESSION: No active disease. Electronically Signed   By: Inez Catalina M.D.   On: 08/11/2017 13:10   ASSESSMENT AND PLAN:   [name]  is a 46 y.o. 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 Worsening abd distension and pain- sent for Ascites tap last week- 5 ltr fluid removed, since  then- have more pain, nausea, not eating any, have chills  * Failure to thrive due to Metastatic breast cancer/severe protein calorie malnutrition   IV fluids, nausea and pain management   Oncology and Palliative care consult to help with management. Dietitian to see pt  * Hypokalemia   Oral and IV replace.  * Hyponatremia   Due to dehydration  * Pain and nausea with cancer   IV meds.   Palliative care.  * Hypothermia    On warming air blanket Pt on IV pressors and empiric abxs  D/w husband  Case discussed with Care Management/Social Worker. Management plans discussed with the patient, family and they are in agreement.  CODE STATUS: DNR  DVT Prophylaxis: heparin  TOTAL TIME TAKING CARE OF THIS PATIENT: *40* minutes.  >50% time spent on counselling and coordination of care  POSSIBLE D/C IN few DAYS, DEPENDING ON CLINICAL CONDITION.  Note: This dictation was prepared with Dragon dictation along with smaller phrase technology. Any transcriptional errors that result from this process are unintentional.  [name] M.D on 08/12/2017 at 3:43 PM  Between 7am to 6pm - Pager - [phone]  After 6pm go to [email or web address] - password [email or web address] [facility]  Office  [phone]  CC: Primary care physician; [facility] IncPatient ID: [name], female   DOB: [birth year], 46 y.o.   MRN: [id number]

## 2017-08-12 NOTE — Progress Notes (Signed)
Alert and oriented. Looks better today. States abdominal pain is improved. CT of abdomen and chest done. Remains on levophed gtt weaned to 8 mcg. 25% Albumin given. NSR on monitor. O2 sats good on RA. Appetite very poor.  Had N&V just prior to and after CT scan. Zofran x1 today.  Seen by Dr Jefferson Fuel, Dr Grayland Ormond, Dr Posey Pronto. Palliative. Nutrition and Care manager.

## 2017-08-12 NOTE — Progress Notes (Signed)
[facility] Progess Note    SYNOPSIS   Metastatic breast cancer with cachexia, now with hypotension, hypothermia, electrolyte abnormalities.    ASSESSMENT/PLAN   Hypotension, septic shock. Has received fluid resuscitation, presently on pressors, norepinephrine, cefepime and vancomycin. Patient is complaining of abdominal discomfort. Will obtain stool for C. difficile and CT scan of the abdomen  Elevated liver function tests. Predominantly cholestatic with elevated alkaline phosphatase and total bilirubin. Metastatic sites include uterus and ovarian, status post mastectomy and hysterectomy with metastasis to bones. Has had recurrent ascites, status post paracentesis.  Electrolyte abnormality. Patient is noted to be hypomagnesemic, hyponatremic, hypokalemic and hypocalcemic. Potassium is now 3.5, magnesium 1.5, uncorrected calcium is 6.4, albumin notch on this morning. We'll replace magnesium, obtain serum albumin and ionized calcium.  Leukocytosis.  Most likely reflects sepsis  Anemia. No evidence of active bleeding  Mild troponin elevation. Most likely reflects supply demand ischemia   Name: [name] MRN: [id number] DOB: [birth year]    ADMISSION DATE:  08/11/2017  SUBJECTIVE:  Patient states that she is feeling better today. Still requiring pressors, states her abdominal pain has improved. Describes it as infra umbilicus. Has had 2 episodes of loose stools  VITAL SIGNS: Temp:  [94.9 F (34.9 C)-98.8 F (37.1 C)] 98.4 F (36.9 C) (10/05 0718) Pulse Rate:  [82-113] 83 (10/05 0700) Resp:  [12-23] 12 (10/05 0700) BP: (64-119)/(54-92) 94/76 (10/05 0718) SpO2:  [89 %-100 %] 100 % (10/05 0700) Weight:  [36.3 kg (80 lb)-42.5 kg (93 lb 11.1 oz)] 42.5 kg (93 lb 11.1 oz) (10/04 1604)     PHYSICAL EXAMINATION: Physical Examination:   VS: BP 94/76   Pulse 83   Temp 98.4 F (36.9 C) (Oral)   Resp 12   Ht 5\' 2"  (1.575 m)   Wt 42.5 kg (93 lb 11.1  oz)   SpO2 100%   BMI 17.14 kg/m   General Appearance: No distress  Neuro:without focal findings, mental status normal. Pulmonary: normal breath sounds   Cardiovascular irregularly irregular rhythm control ventricular response  Abdomen: Positive bowel sounds appreciated, generalized soft exam, some pain noted suprapubic Skin:   warm, no rashes, no ecchymosis  Extremities: normal, no cyanosis, clubbing.    LABORATORY PANEL:   CBC  Recent Labs Lab 08/12/17 0431  WBC 13.0*  HGB 8.7*  HCT 25.2*  PLT 164    Chemistries   Recent Labs Lab 08/11/17 1240 08/12/17 0431  NA 123* 124*  K 3.3* 3.5  CL 89* 95*  CO2 22 21*  GLUCOSE 66 69  BUN 14 11  CREATININE 0.57 0.68  CALCIUM 7.6* 6.4*  MG  --  1.5*  AST 131*  --   ALT 31  --   ALKPHOS 424*  --   BILITOT 2.6*  --      Recent Labs Lab 08/11/17 1606  GLUCAP 65   No results for input(s): PHART, PCO2ART, PO2ART in the last 168 hours.  Recent Labs Lab 08/11/17 1240  AST 131*  ALT 31  ALKPHOS 424*  BILITOT 2.6*  ALBUMIN 2.7*    Cardiac Enzymes  Recent Labs Lab 08/11/17 1240  TROPONINI 0.03*    RADIOLOGY:  Dg Chest Port 1 View  Result Date: 08/11/2017 CLINICAL DATA:  Generalized weakness EXAM: PORTABLE CHEST 1 VIEW COMPARISON:  06/21/2017 FINDINGS: Cardiac shadow is within normal limits. Left chest wall port is noted in satisfactory position. The lungs are well aerated bilaterally without focal infiltrate or sizable effusion. No bony abnormality  is seen. IMPRESSION: No active disease. Electronically Signed   By: Inez Catalina M.D.   On: 08/11/2017 13:10    Hermelinda Dellen, DO 08/12/2017

## 2017-08-12 NOTE — Progress Notes (Signed)
Initial Nutrition Assessment  DOCUMENTATION CODES:   Severe malnutrition in context of chronic illness  INTERVENTION:   Ensure Enlive po TID, each supplement provides 350 kcal and 20 grams of protein  Vital Cuisine TID, each supplement provides 520kcal and 22g of protein.   NUTRITION DIAGNOSIS:   Malnutrition (severe) related to cancer and cancer related treatments as evidenced by mild depletion of muscle mass, severe depletion of body fat, 17 percent weight loss in 8 months and 4% wt loss in 2 weeks.  GOAL:   Other (Comment) (Patient comfort )  MONITOR:   PO intake, Supplement acceptance, Labs, Weight trends  REASON FOR ASSESSMENT:   Malnutrition Screening Tool    ASSESSMENT:   46 y.o. female  with past medical history of metastatic breast cancer to the uterus, ovaries, bones, liver and peritoneum who was admitted on 08/11/2017 with septic shock.  She was complaining of severe abdominal pain. Imaging reveals a spread of the cancer to the liver and pelvic area despite oral chemo .    Pt with severe malnutrition. Pt with poor appetite and oral intake pta. Per chart, pt has lost 19lbs(17%) in 8 months and 4lbs(4%) in two weeks; this is severe. Pt with poor prognosis and being followed by palliative. Per palliative note, pt requesting home hospice and to be kept comfortable. RD will order Ensure and Mighty Shake for pt's comfort; pt does drink Ensure at home.   Medications reviewed and include: heparin, protonix, senokot, cefepime, levophed, vancomycin, zofran, oxycodone   Labs reviewed: Na 124(L), Cl 95(L), Ca 6.4(L) adj. 8.0(L), Mg 1.5(L), alb 2.0(L) Wbc- 13.0(H), Hgb 8,7(L), Hct 25.2(L)  Nutrition-Focused physical exam completed. Findings are severe fat and muscle depletions over entire body, and no edema.   Diet Order:  Diet regular Room service appropriate? Yes; Fluid consistency: Thin  Skin:  Reviewed, no issues  Last BM:  08/11/2017 (type 7)  Height:   Ht Readings  from Last 1 Encounters:  08/11/17 5\' 2"  (1.575 m)    Weight:   Wt Readings from Last 1 Encounters:  08/11/17 93 lb 11.1 oz (42.5 kg)    Ideal Body Weight:  50 kg  BMI:  Body mass index is 17.14 kg/m.  Estimated Nutritional Needs:   Kcal:  1450-1700 calories (35-40 cal/kg)  Protein:  76-85 grams (1.8-2.0g/kg)  Fluid:  1.5-1.7L  EDUCATION NEEDS:   No education needs identified at this time  Koleen Distance MS, RD, [place] Pager #[phone] After Hours Pager: [phone]

## 2017-08-12 NOTE — Care Management (Signed)
Met with patient and her daughter Hanaa Payes (address [place] [address] phone [phone]) regarding hospice at home. Patient plans to ride in a private vehicle to her daughter's address at discharge. They have a bedside commode but requesting a hospital bed. I have provided a list of hospice agencies to both patient and Crystal for review. They hope that [facility] can provide services. I have notified [place] with [facility] of this request and request for hospital bed.

## 2017-08-12 NOTE — Consult Note (Signed)
[place]  [phone] [phone]  ID: [name] OB: 1971/02/18  MR#: [id number]  WNU#:[id number]  Patient Care Team: [facility] as PCP - General  CHIEF COMPLAINT: Progressive stage IV breast cancer, failure to thrive.  INTERVAL HISTORY: Patient is a 46 year old female with stage IV progressive breast cancer who recently underwent paracentesis removing 5 L of fluid. She continues to have a declining performance status with progressive weakness and fatigue. She has continued weight loss with minimal PO intake. She has no neurologic complaints. She denies any fevers. She has no chest pain or shortness of breath. She continues to have persistent nausea and vomiting as well as abdominal pain. She denies constipation or diarrhea. She has no urinary complaints. Patient feels generally terrible, but offers no further specific complaints.  REVIEW OF SYSTEMS:   Review of Systems  Constitutional: Positive for malaise/fatigue and weight loss. Negative for fever.  Respiratory: Negative.  Negative for cough and shortness of breath.   Cardiovascular: Negative.  Negative for chest pain and leg swelling.  Gastrointestinal: Positive for abdominal pain, nausea and vomiting.  Genitourinary: Negative.   Musculoskeletal: Negative.   Skin: Negative.  Negative for rash.  Neurological: Positive for weakness.  Psychiatric/Behavioral: Positive for depression. The patient is not nervous/anxious.     As per HPI. Otherwise, a complete review of systems is negative.  PAST MEDICAL HISTORY: Past Medical History:  Diagnosis Date  . Asthma 2005  . Breast cancer ([place])   . CAD (coronary artery disease)   . Cancer [facility]) 2012   breast cancer, right-treated with neoadjunvant chemotherapy followed by partial mastectomy with sn bx and radiation. Pt is currently taking Tamoxifen since January 2013   . Hypertension 2012  . Lump or mass in breast 2012    right  . Malignant neoplasm of upper-outer quadrant of female breast ([place]) 2012   right  . MI (myocardial infarction) ([place])   . Myocardial infarction ([place]) 10/2012  . Personal history of tobacco use, presenting hazards to health     PAST SURGICAL HISTORY: Past Surgical History:  Procedure Laterality Date  . BREAST LUMPECTOMY Right 2012  . CESAREAN SECTION  [id number]  . CORONARY ANGIOPLASTY WITH STENT PLACEMENT  20123   Myocardial infarction  . FINGER SURGERY Left 1986   reconstruction, left pinky  . LEFT HEART CATHETERIZATION WITH CORONARY ANGIOGRAM N/A 10/16/2012   Procedure: LEFT HEART CATHETERIZATION WITH CORONARY ANGIOGRAM;  Surgeon: Clent Demark, MD;  Location: [facility] CATH LAB;  Service: Cardiovascular;  Laterality: N/A;  . PORTACATH PLACEMENT    . TUBAL LIGATION  2004  . WRIST SURGERY Left 1987    FAMILY HISTORY: Family History  Problem Relation Age of Onset  . Cancer Maternal Grandmother 82       breast    ADVANCED DIRECTIVES (Y/N):  @ADVDIR @  HEALTH MAINTENANCE: Social History  Substance Use Topics  . Smoking status: Current Every Day Smoker    Packs/day: 0.50    Years: 15.00    Types: Cigarettes  . Smokeless tobacco: Never Used  . Alcohol use No     Colonoscopy:  PAP:  Bone density:  Lipid panel:  Allergies  Allergen Reactions  . Codeine Nausea And Vomiting  . Penicillins Rash    Has patient had a PCN reaction causing immediate rash, facial/tongue/throat swelling, SOB or lightheadedness with hypotension: Unknown Has patient had a PCN reaction causing severe rash involving mucus membranes or skin necrosis: No Has patient had  a PCN reaction that required hospitalization: No Has patient had a PCN reaction occurring within the last 10 years: Unknown If all of the above answers are "NO", then may proceed with Cephalosporin use.     Current Facility-Administered Medications  Medication Dose Route Frequency Provider Last Rate Last Dose  . ceFEPIme  (MAXIPIME) 2 g in dextrose 5 % 50 mL IVPB  2 g Intravenous Q12H Vaughan Basta, MD   Stopped at 08/12/17 1108  . docusate sodium (COLACE) capsule 100 mg  100 mg Oral BID PRN Vaughan Basta, MD      . feeding supplement (ENSURE ENLIVE) (ENSURE ENLIVE) liquid 237 mL  237 mL Oral TID BM Fritzi Mandes, MD      . heparin injection 5,000 Units  5,000 Units Subcutaneous Q8H Vaughan Basta, MD   5,000 Units at 08/12/17 1348  . hydrocerin (EUCERIN) cream   Topical BID Fritzi Mandes, MD      . LORazepam (ATIVAN) 2 MG/ML concentrated solution 0.5 mg  0.5 mg Oral Q8H PRN Dellinger, Bobby Rumpf, PA-C      . morphine 2 MG/ML injection 2 mg  2 mg Intravenous Q4H PRN Vaughan Basta, MD   2 mg at 08/11/17 1830  . norepinephrine (LEVOPHED) 4 mg in dextrose 5 % 250 mL (0.016 mg/mL) infusion  0-40 mcg/min Intravenous Continuous Awilda Bill, NP 37.5 mL/hr at 08/12/17 1456 10 mcg/min at 08/12/17 1456  . ondansetron (ZOFRAN) injection 4 mg  4 mg Intravenous Q6H PRN Vaughan Basta, MD   4 mg at 08/12/17 1141  . oxyCODONE-acetaminophen (PERCOCET/ROXICET) 5-325 MG per tablet 1 tablet  1 tablet Oral Q4H PRN Dellinger, Haynes Dage L, PA-C      . pantoprazole (PROTONIX) EC tablet 40 mg  40 mg Oral Daily Fritzi Mandes, MD   40 mg at 08/12/17 1038  . senna (SENOKOT) tablet 8.6 mg  1 tablet Oral QHS Dellinger, Marianne L, PA-C      . vancomycin (VANCOCIN) 500 mg in sodium chloride 0.9 % 100 mL IVPB  500 mg Intravenous Q18H Vaughan Basta, MD 100 mL/hr at 08/12/17 1348 500 mg at 08/12/17 1348   Facility-Administered Medications Ordered in Other Encounters  Medication Dose Route Frequency Provider Last Rate Last Dose  . sodium chloride flush (NS) 0.9 % injection 10 mL  10 mL Intravenous PRN Lloyd Huger, MD   10 mL at 11/05/16 1035    OBJECTIVE: Vitals:   08/12/17 1300 08/12/17 1400  BP: 93/66 91/66  Pulse:  76  Resp:  17  Temp:    SpO2:  96%     Body mass index is 17.14  kg/m.    ECOG FS:4 - Bedbound  General: Cachectic, ill-appearing, no acute distress. Eyes: Pink conjunctiva, anicteric sclera. Lungs: Clear to auscultation bilaterally. Heart: Regular rate and rhythm. No rubs, murmurs, or gallops. Abdomen: Soft, nontender, nondistended. No organomegaly noted, normoactive bowel sounds. Musculoskeletal: No edema, cyanosis, or clubbing. Neuro: Alert, answering all questions appropriately. Cranial nerves grossly intact. Skin: No rashes or petechiae noted. Psych: Normal affect.   LAB RESULTS:  Lab Results  Component Value Date   NA 124 (L) 08/12/2017   K 3.5 08/12/2017   CL 95 (L) 08/12/2017   CO2 21 (L) 08/12/2017   GLUCOSE 69 08/12/2017   BUN 11 08/12/2017   CREATININE 0.68 08/12/2017   CALCIUM 6.4 (LL) 08/12/2017   PROT 5.7 (L) 08/11/2017   ALBUMIN 2.0 (L) 08/12/2017   AST 131 (H) 08/11/2017   ALT 31 08/11/2017  ALKPHOS 424 (H) 08/11/2017   BILITOT 2.6 (H) 08/11/2017   GFRNONAA >60 08/12/2017   GFRAA >60 08/12/2017    Lab Results  Component Value Date   WBC 13.0 (H) 08/12/2017   NEUTROABS 5.1 08/11/2017   HGB 8.7 (L) 08/12/2017   HCT 25.2 (L) 08/12/2017   MCV 84.9 08/12/2017   PLT 164 08/12/2017     STUDIES: Ct Abdomen Pelvis Wo Contrast  Result Date: 08/12/2017 CLINICAL DATA:  Hypotension.  Septic shock. EXAM: CT ABDOMEN AND PELVIS WITHOUT CONTRAST TECHNIQUE: Multidetector CT imaging of the abdomen and pelvis was performed following the standard protocol without IV contrast. COMPARISON:  06/21/2017 FINDINGS: Lower chest: Small bilateral pleural effusions. Dependent atelectasis. Esophagus is mildly distended. Hepatobiliary: Several ill-defined low-density lesions are scattered throughout the liver worrisome for metastatic disease. Gallbladder is decompressed. Pancreas: Unremarkable Spleen: Calcified granulomata. Adrenals/Urinary Tract: Kidneys and adrenal glands are unremarkable. Bladder is decompressed by a Foley catheter  Stomach/Bowel: Stomach is unremarkable. Enteric contrast is present throughout the colon. Colon is relatively decompressed. No gas and fluid filled loops of small bowel are noted. Vascular/Lymphatic: Extensive atherosclerotic calcifications of the aorta and iliac arteries. No evidence of aortic aneurysm. Reproductive: Uterus is absent.  Adnexa and not clearly visualized. Other: Large amount of ascites is present. The posterior peritoneum Ms. thickened in the dependent portion of the pelvis worrisome for peritoneal implants. There is no evidence of omental caking. Musculoskeletal: Scattered tiny sclerotic lesions are seen throughout the visualized spine and pelvis. No vertebral compression deformity IMPRESSION: Advanced metastatic disease is suggested throughout the liver. This should be confirmed with a contrast-enhanced study Prominent ascites. 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 Bilateral pleural effusions with dependent atelectasis. Electronically Signed   By: Marybelle Killings M.D.   On: 08/12/2017 11:29   US Paracentesis  Result Date: 08/05/2017 INDICATION: History of breast cancer with metastatic disease to her ovaries and abdominal cavity. Ascites. Request is made for therapeutic paracentesis. EXAM: ULTRASOUND GUIDED THERAPEUTIC PARACENTESIS MEDICATIONS: 1% lidocaine COMPLICATIONS: None immediate. PROCEDURE: Informed written consent was obtained from the patient after a discussion of the risks, benefits and alternatives to treatment. A timeout was performed prior to the initiation of the procedure. Initial ultrasound scanning demonstrates a large amount of ascites within the left lower abdominal quadrant. The left lower abdomen was prepped and draped in the usual sterile fashion. 1% lidocaine was used for local anesthesia. Following this, a Safe-T-Centesis catheter was introduced. An ultrasound image was saved for documentation purposes. The paracentesis was  performed. The catheter was removed and a dressing was applied. The patient tolerated the procedure well without immediate post procedural complication. FINDINGS: A total of approximately 5 L of serous fluid was removed. IMPRESSION: Successful ultrasound-guided paracentesis yielding 5 liters of peritoneal fluid. Read by: Saverio Danker, PA-C Electronically Signed   By: Markus Daft M.D.   On: 08/05/2017 09:42   Dg Chest Port 1 View  Result Date: 08/11/2017 CLINICAL DATA:  Generalized weakness EXAM: PORTABLE CHEST 1 VIEW COMPARISON:  06/21/2017 FINDINGS: Cardiac shadow is within normal limits. Left chest wall port is noted in satisfactory position. The lungs are well aerated bilaterally without focal infiltrate or sizable effusion. No bony abnormality is seen. IMPRESSION: No active disease. Electronically Signed   By: Inez Catalina M.D.   On: 08/11/2017 13:10    ASSESSMENT: Progressive stage IV breast cancer, failure to thrive.  PLAN:    1. Progressive stage IV breast cancer: Patient has continued worsening of disease  and most recently was on oral chemotherapy using Ibrance. Given her declining performance status, agree with no further treatment. Patient intends to go home with hospice care. No further intervention is needed. No follow-up in the [facility] is necessary. 2. Failure to thrive: Home with hospice as above. 3. Hypotension: Patient currently being weaned off Levophed.  Appreciate consult, will follow. Call with questions.    Cancer Staging Lobular breast cancer [facility]) Staging form: Breast, AJCC 7th Edition - Clinical stage from 04/09/2015: Stage IV (T1c, N1, M1) - Signed by Lloyd Huger, MD on 04/09/2015   Lloyd Huger, MD   08/12/2017 3:38 PM

## 2017-08-12 NOTE — Progress Notes (Signed)
CRITICAL VALUE ALERT  Critical Value:  Calcium 6.4  Date & Time Notied:  08/12/2017 0540  Provider Notified: Burman Nieves NP  Orders Received/Actions taken: No new orders

## 2017-08-12 NOTE — Consult Note (Signed)
Consultation Note Date: 08/12/2017   Patient Name: [name]  DOB: 08/05/1971  MRN: [id number]  Age / Sex: 46 y.o., female  PCP: [facility] Referring Physician: Fritzi Mandes, MD  Reason for Consultation: Establishing goals of care  HPI/Patient Profile: 46 y.o. female  with past medical history of metastatic breast cancer to the uterus, ovaries, bones, liver and peritoneum who was admitted on 08/11/2017 with septic shock.  She was complaining of severe abdominal pain. Imaging reveals a spread of the cancer to the liver and pelvic area despite oral chemo .   Clinical Assessment and Goals of Care: * I have reviewed medical records including EPIC notes, labs and imaging, received report from the care team, assessed the patient and then met at the bedside along with the patient her two daughters and her husband  to discuss diagnosis prognosis, GOC, EOL wishes, disposition and options.  I introduced Palliative Medicine as specialized medical care for people living with serious illness. It focuses on providing relief from the symptoms and stress of a serious illness. The goal is to improve quality of life for both the patient and the family.  We discussed a brief life review of the patient. She used to work as a [employer] manager.  She is from [place].  Her mother has Alzheimers and is in a nursing home.  As far as functional and nutritional status she eats very little.  All solid PO intake is vomited back up.  She is able to stand and take a few steps but is very dizzy quickly.  She stays mostly at her daughter Crystal's house.   We discussed their current illness and what it means in the larger context of their on-going co-morbidities.  Natural disease trajectory and expectations at EOL were discussed. I attempted to elicit values and goals of care important to the patient.  It is most important to  spend time with her family - especially her new grand child.  She does not want to spend time in doctors offices and hospitals.  The difference between aggressive medical intervention and comfort care was considered in light of the patient's goals of care.  The patient wants to stop chemotherapy and be comfortable for the time she has left.  She asked me for a prognosis and with much hesitation I told her weeks to months.  Hospice and Palliative Care services outpatient were explained and offered.  The family would like to care for her at [facility] with home hospice services.  Questions and concerns were addressed.  The family was encouraged to call with questions or concerns.    Primary Decision Maker:  PATIENT and dtrs    SUMMARY OF RECOMMENDATIONS    Stop Chemo.  Consider Pleurx drain placement to drain rapidly recurring ascites.  Patient does not want to keep returning for paracentesis.  Discontinue interventions not related to comfort.  Increase comfort meds and measures  Hospice services in the home  Please discharge with hospice when medically optimized.  She would like to have as much time as possible.   Code Status/Advance Care Planning:  dnr   Symptom Management:   Changed percocet to PRN q 4  Added liquid ativan PRN    Senna 1 qhs - bowel regimen will be important for comfort.  Please include these medications on discharge.  Psycho-social/Spiritual:   Desire for further Chaplaincy support:  Prognosis:   Weeks to months given widely metastatic breast cancer - mets to uterus, ovaries, bone, liver, peritoneum  Discharge Planning: Home with Hospice      Primary Diagnoses: Present on Admission: . Sepsis ([place])   I have reviewed the medical record, interviewed the patient and family, and examined the patient. The following aspects are pertinent.  Past Medical History:  Diagnosis Date  . Asthma 2005  . Breast cancer ([place])   . CAD (coronary  artery disease)   . Cancer [facility]) 2012   breast cancer, right-treated with neoadjunvant chemotherapy followed by partial mastectomy with sn bx and radiation. Pt is currently taking Tamoxifen since January 2013   . Hypertension 2012  . Lump or mass in breast 2012   right  . Malignant neoplasm of upper-outer quadrant of female breast ([place]) 2012   right  . MI (myocardial infarction) ([place])   . Myocardial infarction ([place]) 10/2012  . Personal history of tobacco use, presenting hazards to health    Social History   Social History  . Marital status: Married    Spouse name: N/A  . Number of children: N/A  . Years of education: N/A   Social History Main Topics  . Smoking status: Current Every Day Smoker    Packs/day: 0.50    Years: 15.00    Types: Cigarettes  . Smokeless tobacco: Never Used  . Alcohol use No  . Drug use: No  . Sexual activity: Not Asked     Comment: Tubal Ligation 2004 per Sx Hx   Other Topics Concern  . None   Social History Narrative  . None   Family History  Problem Relation Age of Onset  . Cancer Maternal Grandmother 75       breast   Scheduled Meds: . heparin  5,000 Units Subcutaneous Q8H  . pantoprazole  40 mg Oral Daily   Continuous Infusions: . ceFEPime (MAXIPIME) IV 2 g (08/12/17 1038)  . norepinephrine (LEVOPHED) Adult infusion 11 mcg/min (08/12/17 0700)  . vancomycin (VANCOCIN) 500 mg/122m IVPB     PRN Meds:.docusate sodium, morphine injection, ondansetron (ZOFRAN) IV, oxyCODONE-acetaminophen Allergies  Allergen Reactions  . Codeine Nausea And Vomiting  . Penicillins Rash    Has patient had a PCN reaction causing immediate rash, facial/tongue/throat swelling, SOB or lightheadedness with hypotension: Unknown Has patient had a PCN reaction causing severe rash involving mucus membranes or skin necrosis: No Has patient had a PCN reaction that required hospitalization: No Has patient had a PCN reaction occurring within the last 10 years:  Unknown If all of the above answers are "NO", then may proceed with Cephalosporin use.    Review of Systems +abdominal pain, ongoing vomiting of solids, weakness and fatigue Denies SOB, CP  Physical Exam  Thin frail female, A&O, coherent, cooperative cv rrr resp no distress Abdomen distended and tight Ext no edema.  Vital Signs: BP (!) 87/70   Pulse 81   Temp 98.4 F (36.9 C) (Oral)   Resp 11   Ht 5' 2" (1.575 m)   Wt 42.5 kg (93 lb 11.1 oz)   SpO2 100%  BMI 17.14 kg/m  Pain Assessment: No/denies pain POSS *See Group Information*: S-Acceptable,Sleep, easy to arouse Pain Score: Asleep   SpO2: SpO2: 100 % O2 Device:SpO2: 100 % O2 Flow Rate: .O2 Flow Rate (L/min): 2 L/min  IO: Intake/output summary:  Intake/Output Summary (Last 24 hours) at 08/12/17 1228 Last data filed at 08/12/17 0800  Gross per 24 hour  Intake           636.81 ml  Output              250 ml  Net           386.81 ml    LBM: Last BM Date: 08/11/17 (small amount watery yesterday) Baseline Weight: Weight: 36.3 kg (80 lb) Most recent weight: Weight: 42.5 kg (93 lb 11.1 oz)     Palliative Assessment/Data:     Time In: 11:40 Time Out: 12:44 Time Total: 64 min Greater than 50%  of this time was spent counseling and coordinating care related to the above assessment and plan.  Signed by: Florentina Jenny, PA-C Palliative Medicine Pager: [phone]  Please contact Palliative Medicine Team phone at [phone] for questions and concerns.  For individual provider: See Shea Evans

## 2017-08-13 LAB — BASIC METABOLIC PANEL
ANION GAP: 8 (ref 5–15)
BUN: 7 mg/dL (ref 6–20)
CALCIUM: 6.9 mg/dL — AB (ref 8.9–10.3)
CO2: 22 mmol/L (ref 22–32)
Chloride: 96 mmol/L — ABNORMAL LOW (ref 101–111)
Creatinine, Ser: 0.59 mg/dL (ref 0.44–1.00)
GFR calc non Af Amer: >60 mL/min (ref >60)
Glucose, Bld: 151 mg/dL — ABNORMAL HIGH (ref 65–99)
POTASSIUM: 3.6 mmol/L (ref 3.5–5.1)
Sodium: 126 mmol/L — ABNORMAL LOW (ref 135–145)

## 2017-08-13 LAB — MAGNESIUM: Magnesium: 2.1 mg/dL (ref 1.7–2.4)

## 2017-08-13 LAB — HIV ANTIBODY (ROUTINE TESTING W REFLEX): HIV SCREEN 4TH GENERATION: NONREACTIVE

## 2017-08-13 MED ORDER — SODIUM CHLORIDE 0.9 % IV BOLUS (SEPSIS)
500.0000 mL | Freq: Once | INTRAVENOUS | Status: AC
  Administered 2017-08-13: 500 mL via INTRAVENOUS

## 2017-08-13 MED ORDER — NICOTINE 21 MG/24HR TD PT24
21.0000 mg | MEDICATED_PATCH | Freq: Every day | TRANSDERMAL | Status: DC
  Administered 2017-08-13 – 2017-08-15 (×3): 21 mg via TRANSDERMAL
  Filled 2017-08-13 (×3): qty 1

## 2017-08-13 MED ORDER — HYDROCORTISONE NA SUCCINATE PF 100 MG IJ SOLR
100.0000 mg | Freq: Three times a day (TID) | INTRAMUSCULAR | Status: DC
  Administered 2017-08-13 – 2017-08-15 (×7): 100 mg via INTRAVENOUS
  Filled 2017-08-13 (×9): qty 2

## 2017-08-13 NOTE — Progress Notes (Signed)
Patient notified RN that she wants to take foley catheter out,  Nurse Tech assisted patient on bedpan when urge to void and patient is able to void into bedpan even with catheter in place.  RN notified Dr Jefferson Fuel who gave order to remove foley.

## 2017-08-13 NOTE — Progress Notes (Signed)
Patient alert and oriented, SR on cardiac monitor, vital signs stable, levophed gtt currently off.  Will monitor for need to restart.  Foley removed per patient's request and MD's order.  Patient currently sitting in bed in no apparent distress. See CH[facility] for further details.

## 2017-08-13 NOTE — Progress Notes (Signed)
[facility] [place] NAME: [name]    MR#:  [id number]  DATE OF BIRTH:  1971/10/12  SUBJECTIVE:   Feels weak. Abdominal pain is well controlled. Still on IV levo fed.  REVIEW OF SYSTEMS:   Review of Systems  Constitutional: Positive for weight loss. Negative for chills and fever.  HENT: Negative for ear discharge, ear pain and nosebleeds.   Eyes: Negative for blurred vision, pain and discharge.  Respiratory: Negative for sputum production, shortness of breath, wheezing and stridor.   Cardiovascular: Negative for chest pain, palpitations, orthopnea and PND.  Gastrointestinal: Positive for abdominal pain and nausea. Negative for diarrhea and vomiting.  Genitourinary: Negative for frequency and urgency.  Musculoskeletal: Negative for back pain and joint pain.  Neurological: Positive for weakness. Negative for sensory change, speech change and focal weakness.  Psychiatric/Behavioral: Negative for depression and hallucinations. The patient is not nervous/anxious.    Tolerating Diet:poor Tolerating PT: pending  DRUG ALLERGIES:   Allergies  Allergen Reactions  . Codeine Nausea And Vomiting  . Penicillins Rash    Has patient had a PCN reaction causing immediate rash, facial/tongue/throat swelling, SOB or lightheadedness with hypotension: Unknown Has patient had a PCN reaction causing severe rash involving mucus membranes or skin necrosis: No Has patient had a PCN reaction that required hospitalization: No Has patient had a PCN reaction occurring within the last 10 years: Unknown If all of the above answers are "NO", then may proceed with Cephalosporin use.     VITALS:  Blood pressure 95/69, pulse 71, temperature 98.1 F (36.7 C), temperature source Oral, resp. rate 18, height 5\' 2"  (1.575 m), weight 42.5 kg (93 lb 11.1 oz), SpO2 98 %.  PHYSICAL EXAMINATION:   Physical Exam  GENERAL:  46 y.o.-year-old patient lying in the  bed with no acute distress. Thin cachectic EYES: Pupils equal, round, reactive to light and accommodation. No scleral icterus. Extraocular muscles intact.  HEENT: Head atraumatic, normocephalic. Oropharynx and nasopharynx clear.  NECK:  Supple, no jugular venous distention. No thyroid enlargement, no tenderness.  LUNGS: Normal breath sounds bilaterally, no wheezing, rales, rhonchi. No use of accessory muscles of respiration.  CARDIOVASCULAR: S1, S2 normal. No murmurs, rubs, or gallops.  ABDOMEN: Soft, nontender, nondistended. Bowel sounds present. No organomegaly or mass.  EXTREMITIES: No cyanosis, clubbing or edema b/l.    NEUROLOGIC: Cranial nerves II through XII are intact. No focal Motor or sensory deficits b/l.   PSYCHIATRIC:  patient is alert and oriented x 3.  SKIN: No obvious rash, lesion, or ulcer. Pt is very skinny!!  LABORATORY PANEL:  CBC  Recent Labs Lab 08/12/17 0431  WBC 13.0*  HGB 8.7*  HCT 25.2*  PLT 164    Chemistries   Recent Labs Lab 08/11/17 1240  08/13/17 0622  NA 123*  < > 126*  K 3.3*  < > 3.6  CL 89*  < > 96*  CO2 22  < > 22  GLUCOSE 66  < > 151*  BUN 14  < > 7  CREATININE 0.57  < > 0.59  CALCIUM 7.6*  < > 6.9*  MG  --   < > 2.1  AST 131*  --   --   ALT 31  --   --   ALKPHOS 424*  --   --   BILITOT 2.6*  --   --   < > = values in this interval not displayed. Cardiac Enzymes  Recent Labs  Lab 08/11/17 1240  TROPONINI 0.03*   RADIOLOGY:  Ct Abdomen Pelvis Wo Contrast  Result Date: 08/12/2017 CLINICAL DATA:  Hypotension.  Septic shock. EXAM: CT ABDOMEN AND PELVIS WITHOUT CONTRAST TECHNIQUE: Multidetector CT imaging of the abdomen and pelvis was performed following the standard protocol without IV contrast. COMPARISON:  06/21/2017 FINDINGS: Lower chest: Small bilateral pleural effusions. Dependent atelectasis. Esophagus is mildly distended. Hepatobiliary: Several ill-defined low-density lesions are scattered throughout the liver worrisome for  metastatic disease. Gallbladder is decompressed. Pancreas: Unremarkable Spleen: Calcified granulomata. Adrenals/Urinary Tract: Kidneys and adrenal glands are unremarkable. Bladder is decompressed by a Foley catheter Stomach/Bowel: Stomach is unremarkable. Enteric contrast is present throughout the colon. Colon is relatively decompressed. No gas and fluid filled loops of small bowel are noted. Vascular/Lymphatic: Extensive atherosclerotic calcifications of the aorta and iliac arteries. No evidence of aortic aneurysm. Reproductive: Uterus is absent.  Adnexa and not clearly visualized. Other: Large amount of ascites is present. The posterior peritoneum Ms. thickened in the dependent portion of the pelvis worrisome for peritoneal implants. There is no evidence of omental caking. Musculoskeletal: Scattered tiny sclerotic lesions are seen throughout the visualized spine and pelvis. No vertebral compression deformity IMPRESSION: Advanced metastatic disease is suggested throughout the liver. This should be confirmed with a contrast-enhanced study Prominent ascites. 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 Bilateral pleural effusions with dependent atelectasis. Electronically Signed   By: Marybelle Killings M.D.   On: 08/12/2017 11:29   ASSESSMENT AND PLAN:   [name]  is a 46 y.o. 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 Worsening abd distension and pain- sent for Ascites tap last week- 5 ltr fluid removed, since then- have more pain, nausea, not eating any, have chills  * Hypotension. With associated hypothermia patient is on IV pressors, antibiotics for possible septic shock. Cultures are no growth to date. Could be due to metastatic disease and significant weight loss. Discussed with Dr. Jefferson Fuel in ICU.  * Failure to thrive due to Metastatic breast  cancer/severe protein calorie malnutrition   IV fluids, nausea and pain management   Oncology and Palliative care consult to help with management.  * Hypokalemia   Oral and IV replace.  * Hyponatremia   Due to dehydration  * Pain and nausea with cancer   IV meds.   Palliative care.  * Malignant ascites. This will be a recurrent problem. Patient does not want a peritoneal catheter placed. We discussed regarding monitoring this and using pain medications. Cough persists any further discomfort. She would prefer staying home than having Hospital and clinic visits.  Discussed with case management. Hospice at discharge.  Case discussed with Care Management/Social Worker. Management plans discussed with the patient, family and they are in agreement.  CODE STATUS: DNR  Patient is critically ill. Poor prognosis.  DVT Prophylaxis: heparin  TOTAL TIME TAKING CARE OF THIS PATIENT: 30 minutes.   POSSIBLE D/C IN 1-2 DAYS, DEPENDING ON CLINICAL CONDITION.  Note: This dictation was prepared with Dragon dictation along with smaller phrase technology. Any transcriptional errors that result from this process are unintentional.  Hillary Bow R M.D on 08/13/2017 at 2:15 PM  Between 7am to 6pm - Pager - [phone]  After 6pm go to [email or web address] - password [email or web address] [facility]  Office  [phone]  CC: Primary care physician; [facility] ID:  Benedict Needy, female   DOB: [birth year], 46 y.o.   MRN: [id number]

## 2017-08-13 NOTE — Progress Notes (Signed)
RN notified Dr Jefferson Fuel of patient's requesting nicotine patch.  MD gave order for 21mg  nicotine patch daily

## 2017-08-13 NOTE — Progress Notes (Signed)
[facility] Progess Note    SYNOPSIS   Metastatic breast cancer with cachexia, now with hypotension, hypothermia, electrolyte abnormalities.    ASSESSMENT/PLAN   Hypotension, septic shock. Has received fluid resuscitation, presently on pressors, norepinephrine, cefepime and vancomycin. Patient is complaining of abdominal discomfort. CT scan of her abdomen revealed ascites with evidence of widely metastatic breast cancer. Patient is aware. We'll try additional fluid resuscitation today, goal mean arterial pressure 60 or greater.  Elevated liver function tests. Predominantly cholestatic with elevated alkaline phosphatase and total bilirubin. Metastatic sites include uterus and ovarian, status post mastectomy and hysterectomy with metastasis to bones. Has had recurrent ascites, status post paracentesis.  Electrolyte abnormality. Sodium has been slowly increasing now 126, potassium 3.6, received a dose of albumin and magnesium yesterday, will recheck magnesium this morning  Leukocytosis.  Most likely reflects sepsis. Presently on broad-spectrum coverage to include vancomycin and cefepime  Anemia. No evidence of active bleeding  Mild troponin elevation. Most likely reflects supply demand ischemia   Name: [name] MRN: [id number] DOB: 1971-03-06    ADMISSION DATE:  08/11/2017  SUBJECTIVE:  Patient states that she is feeling better today. Still requiring pressors, states her abdominal pain has improved. Describes it as infra umbilicus. Has had 2 episodes of loose stools  VITAL SIGNS: Temp:  [97.2 F (36.2 C)-98.2 F (36.8 C)] 97.8 F (36.6 C) (10/06 0800) Pulse Rate:  [64-81] 71 (10/06 0800) Resp:  [10-22] 17 (10/06 0800) BP: (83-102)/(55-77) 87/67 (10/06 0800) SpO2:  [96 %-100 %] 99 % (10/06 0800)     PHYSICAL EXAMINATION: Physical Examination:   VS: BP (!) 87/67   Pulse 71   Temp 97.8 F (36.6 C) (Oral)   Resp 17   Ht 5\' 2"  (1.575 m)   Wt  42.5 kg (93 lb 11.1 oz)   SpO2 99%   BMI 17.14 kg/m   General Appearance: No distress  Neuro:without focal findings, mental status normal. Pulmonary: normal breath sounds   Cardiovascular irregularly irregular rhythm control ventricular response  Abdomen: Positive bowel sounds appreciated, generalized soft exam, some pain noted suprapubic Skin:   warm, no rashes, no ecchymosis  Extremities: normal, no cyanosis, clubbing.    LABORATORY PANEL:   CBC  Recent Labs Lab 08/12/17 0431  WBC 13.0*  HGB 8.7*  HCT 25.2*  PLT 164    Chemistries   Recent Labs Lab 08/11/17 1240 08/12/17 0431 08/13/17 0622  NA 123* 124* 126*  K 3.3* 3.5 3.6  CL 89* 95* 96*  CO2 22 21* 22  GLUCOSE 66 69 151*  BUN 14 11 7   CREATININE 0.57 0.68 0.59  CALCIUM 7.6* 6.4* 6.9*  MG  --  1.5*  --   AST 131*  --   --   ALT 31  --   --   ALKPHOS 424*  --   --   BILITOT 2.6*  --   --      Recent Labs Lab 08/11/17 1606  GLUCAP 65   No results for input(s): PHART, PCO2ART, PO2ART in the last 168 hours.  Recent Labs Lab 08/11/17 1240 08/12/17 0431  AST 131*  --   ALT 31  --   ALKPHOS 424*  --   BILITOT 2.6*  --   ALBUMIN 2.7* 2.0*    Cardiac Enzymes  Recent Labs Lab 08/11/17 1240  TROPONINI 0.03*    RADIOLOGY:  Ct Abdomen Pelvis Wo Contrast  Result Date: 08/12/2017 CLINICAL DATA:  Hypotension.  Septic shock.  EXAM: CT ABDOMEN AND PELVIS WITHOUT CONTRAST TECHNIQUE: Multidetector CT imaging of the abdomen and pelvis was performed following the standard protocol without IV contrast. COMPARISON:  06/21/2017 FINDINGS: Lower chest: Small bilateral pleural effusions. Dependent atelectasis. Esophagus is mildly distended. Hepatobiliary: Several ill-defined low-density lesions are scattered throughout the liver worrisome for metastatic disease. Gallbladder is decompressed. Pancreas: Unremarkable Spleen: Calcified granulomata. Adrenals/Urinary Tract: Kidneys and adrenal glands are unremarkable.  Bladder is decompressed by a Foley catheter Stomach/Bowel: Stomach is unremarkable. Enteric contrast is present throughout the colon. Colon is relatively decompressed. No gas and fluid filled loops of small bowel are noted. Vascular/Lymphatic: Extensive atherosclerotic calcifications of the aorta and iliac arteries. No evidence of aortic aneurysm. Reproductive: Uterus is absent.  Adnexa and not clearly visualized. Other: Large amount of ascites is present. The posterior peritoneum Ms. thickened in the dependent portion of the pelvis worrisome for peritoneal implants. There is no evidence of omental caking. Musculoskeletal: Scattered tiny sclerotic lesions are seen throughout the visualized spine and pelvis. No vertebral compression deformity IMPRESSION: Advanced metastatic disease is suggested throughout the liver. This should be confirmed with a contrast-enhanced study Prominent ascites. 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 Bilateral pleural effusions with dependent atelectasis. Electronically Signed   By: Marybelle Killings M.D.   On: 08/12/2017 11:29   Dg Chest Port 1 View  Result Date: 08/11/2017 CLINICAL DATA:  Generalized weakness EXAM: PORTABLE CHEST 1 VIEW COMPARISON:  06/21/2017 FINDINGS: Cardiac shadow is within normal limits. Left chest wall port is noted in satisfactory position. The lungs are well aerated bilaterally without focal infiltrate or sizable effusion. No bony abnormality is seen. IMPRESSION: No active disease. Electronically Signed   By: Inez Catalina M.D.   On: 08/11/2017 13:10    Hermelinda Dellen, DO 08/13/2017

## 2017-08-14 LAB — CBC
HCT: 26.6 % — ABNORMAL LOW (ref 35.0–47.0)
Hemoglobin: 9.3 g/dL — ABNORMAL LOW (ref 12.0–16.0)
MCH: 29.3 pg (ref 26.0–34.0)
MCHC: 34.8 g/dL (ref 32.0–36.0)
MCV: 84.3 fL (ref 80.0–100.0)
PLATELETS: 156 10*3/uL (ref 150–440)
RBC: 3.16 MIL/uL — AB (ref 3.80–5.20)
RDW: 17.6 % — ABNORMAL HIGH (ref 11.5–14.5)
WBC: 10.7 10*3/uL (ref 3.6–11.0)

## 2017-08-14 LAB — VANCOMYCIN, TROUGH: Vancomycin Tr: 9 ug/mL — ABNORMAL LOW (ref 15–20)

## 2017-08-14 LAB — BASIC METABOLIC PANEL
Anion gap: 11 (ref 5–15)
BUN: 9 mg/dL (ref 6–20)
CHLORIDE: 97 mmol/L — AB (ref 101–111)
CO2: 22 mmol/L (ref 22–32)
Calcium: 6.9 mg/dL — ABNORMAL LOW (ref 8.9–10.3)
Creatinine, Ser: 0.63 mg/dL (ref 0.44–1.00)
GFR calc Af Amer: >60 mL/min (ref >60)
GLUCOSE: 122 mg/dL — AB (ref 65–99)
POTASSIUM: 4.2 mmol/L (ref 3.5–5.1)
SODIUM: 130 mmol/L — AB (ref 135–145)

## 2017-08-14 LAB — PHOSPHORUS: Phosphorus: 2.2 mg/dL — ABNORMAL LOW (ref 2.5–4.6)

## 2017-08-14 LAB — MAGNESIUM: Magnesium: 2.2 mg/dL (ref 1.7–2.4)

## 2017-08-14 MED ORDER — MIDODRINE HCL 5 MG PO TABS
5.0000 mg | ORAL_TABLET | Freq: Three times a day (TID) | ORAL | Status: DC
  Administered 2017-08-15 (×2): 5 mg via ORAL
  Filled 2017-08-14 (×5): qty 1

## 2017-08-14 MED ORDER — VANCOMYCIN HCL 500 MG IV SOLR
500.0000 mg | Freq: Two times a day (BID) | INTRAVENOUS | Status: DC
  Administered 2017-08-14 – 2017-08-15 (×2): 500 mg via INTRAVENOUS
  Filled 2017-08-14 (×3): qty 500

## 2017-08-14 MED ORDER — VANCOMYCIN HCL IN DEXTROSE 750-5 MG/150ML-% IV SOLN
750.0000 mg | INTRAVENOUS | Status: DC
  Filled 2017-08-14 (×2): qty 150

## 2017-08-14 MED ORDER — SODIUM CHLORIDE 0.9 % IV BOLUS (SEPSIS)
500.0000 mL | Freq: Once | INTRAVENOUS | Status: AC
  Administered 2017-08-14: 500 mL via INTRAVENOUS

## 2017-08-14 NOTE — Progress Notes (Signed)
Per Dr Jefferson Fuel give 500 cc bolus, titrate levophed down, we will transfer to floor today.  MAP of 60 is acceptable

## 2017-08-14 NOTE — Progress Notes (Signed)
Report given to Dedra on 1C, pt will transfer on 1C bed, no O2 needed, chart and belongings to accompany

## 2017-08-14 NOTE — Progress Notes (Signed)
Discontinue cardiac monitor and continuous pulse ox per Dr Darvin Neighbours.

## 2017-08-14 NOTE — Progress Notes (Signed)
[facility] at [place] NAME: [name]    MR#:  [id number]  DATE OF BIRTH:  [birth year]  SUBJECTIVE:   Slept well. Still weak Wants to go home On levophed  REVIEW OF SYSTEMS:   Review of Systems  Constitutional: Positive for weight loss. Negative for chills and fever.  HENT: Negative for ear discharge, ear pain and nosebleeds.   Eyes: Negative for blurred vision, pain and discharge.  Respiratory: Negative for sputum production, shortness of breath, wheezing and stridor.   Cardiovascular: Negative for chest pain, palpitations, orthopnea and PND.  Gastrointestinal: Positive for abdominal pain and nausea. Negative for diarrhea and vomiting.  Genitourinary: Negative for frequency and urgency.  Musculoskeletal: Negative for back pain and joint pain.  Neurological: Positive for weakness. Negative for sensory change, speech change and focal weakness.  Psychiatric/Behavioral: Negative for depression and hallucinations. The patient is not nervous/anxious.    Tolerating Diet:poor Tolerating PT: pending  DRUG ALLERGIES:   Allergies  Allergen Reactions  . Codeine Nausea And Vomiting  . Penicillins Rash    Has patient had a PCN reaction causing immediate rash, facial/tongue/throat swelling, SOB or lightheadedness with hypotension: Unknown Has patient had a PCN reaction causing severe rash involving mucus membranes or skin necrosis: No Has patient had a PCN reaction that required hospitalization: No Has patient had a PCN reaction occurring within the last 10 years: Unknown If all of the above answers are "NO", then may proceed with Cephalosporin use.     VITALS:  Blood pressure (!) 81/66, pulse 71, temperature (!) 97.5 F (36.4 C), resp. rate 13, height 5\' 2"  (1.575 m), weight 42.5 kg (93 lb 11.1 oz), SpO2 98 %.  PHYSICAL EXAMINATION:   Physical Exam  GENERAL:  46 y.o.-year-old patient lying in the bed with no acute distress. Thin  cachectic EYES: Pupils equal, round, reactive to light and accommodation. No scleral icterus. Extraocular muscles intact.  HEENT: Head atraumatic, normocephalic. Oropharynx and nasopharynx clear.  NECK:  Supple, no jugular venous distention. No thyroid enlargement, no tenderness.  LUNGS: Normal breath sounds bilaterally, no wheezing, rales, rhonchi. No use of accessory muscles of respiration.  CARDIOVASCULAR: S1, S2 normal. No murmurs, rubs, or gallops.  ABDOMEN: Soft, nontender, nondistended. Bowel sounds present. No organomegaly or mass.  EXTREMITIES: No cyanosis, clubbing or edema b/l.    NEUROLOGIC: Cranial nerves II through XII are intact. No focal Motor or sensory deficits b/l.   PSYCHIATRIC:  patient is alert and oriented x 3.  SKIN: No obvious rash, lesion, or ulcer. Pt is very skinny!!  LABORATORY PANEL:  CBC  Recent Labs Lab 08/14/17 0433  WBC 10.7  HGB 9.3*  HCT 26.6*  PLT 156    Chemistries   Recent Labs Lab 08/11/17 1240  08/14/17 0433  NA 123*  < > 130*  K 3.3*  < > 4.2  CL 89*  < > 97*  CO2 22  < > 22  GLUCOSE 66  < > 122*  BUN 14  < > 9  CREATININE 0.57  < > 0.63  CALCIUM 7.6*  < > 6.9*  MG  --   < > 2.2  AST 131*  --   --   ALT 31  --   --   ALKPHOS 424*  --   --   BILITOT 2.6*  --   --   < > = values in this interval not displayed. Cardiac Enzymes  Recent Labs Lab 08/11/17 1240  TROPONINI 0.03*   RADIOLOGY:  No results found. ASSESSMENT AND PLAN:   [name]  is a 45 y.o. 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 Worsening abd distension and pain- sent for Ascites tap last week- 5 ltr fluid removed, since then- have more pain, nausea, not eating any, have chills  * Hypotension. With associated hypothermia patient is on IV pressors, antibiotics for possible septic shock. Cultures are no growth to date. Could be due to  metastatic disease and significant weight loss. Discussed with Dr. Jefferson Fuel in ICU. Continues to be on levophed today  * Failure to thrive due to Metastatic breast cancer/severe protein calorie malnutrition   IV fluids, nausea and pain management   Oncology and Palliative care consult to help with management.  * Hypokalemia  replaced  * Hyponatremia   Due to dehydration  * Pain and nausea with cancer   IV meds.   Palliative care.  * Malignant ascites. This will be a recurrent problem. Patient does not want a peritoneal catheter placed. We discussed regarding monitoring this and using pain medications. Cough persists any further discomfort. She would prefer staying home than having Hospital and clinic visits.  Likely d/c home with hospice tomorrow.  Case discussed with Care Management/Social Worker. Management plans discussed with the patient, family and they are in agreement.  CODE STATUS: DNR  Patient is critically ill. Poor prognosis.  DVT Prophylaxis: heparin  TOTAL TIME TAKING CARE OF THIS PATIENT: 30 minutes.   POSSIBLE D/C IN 1-2 DAYS, DEPENDING ON CLINICAL CONDITION.  Note: This dictation was prepared with Dragon dictation along with smaller phrase technology. Any transcriptional errors that result from this process are unintentional.  [name] R M.D on 08/14/2017 at 11:21 AM  Between 7am to 6pm - Pager - [phone]  After 6pm go to [email or web address] - password [email or web address] [facility]  Office  [phone]  CC: Primary care physician; [facility] IncPatient ID: [name], female   DOB: Feb 23, 1971, 46 y.o.   MRN: [id number]

## 2017-08-14 NOTE — Care Management Note (Signed)
Case Management Note  Patient Details  Name: [name] MRN: [id number] Date of Birth: 06/20/1971  Subjective/Objective:      Daughter [name] chose [facility] to be [name] services provider. Referral was re-faxed to [facility] of A/C on 08/13/17. Request for a hospital bed to be set up in the daughters home at [address] [place], [place] [address] [phone].  Lorelle Formosa at [facility] of A/C is aware of potential discharge to daughter [name] home on Monday 08/15/17.                 Action/Plan:   Expected Discharge Date:                  Expected Discharge Plan:     In-House Referral:     Discharge planning Services  CM Consult  Post Acute Care Choice:  Hospice, Durable Medical Equipment Choice offered to:  Patient, Adult Children  DME Arranged:  Hospital bed DME Agency:     HH Arranged:    [place] Agency:  [facility]  Status of Service:  In process, will continue to follow  If discussed at Long Length of Stay Meetings, dates discussed:    Additional Comments:  Christie Viscomi A, RN 08/14/2017, 4:30 PM

## 2017-08-14 NOTE — Progress Notes (Signed)
Pharmacy Antibiotic Note  [name] is a 46 y.o. female admitted on 08/11/2017 with sepsis.  Pharmacy has been consulted for Vancomycin and Cefepime dosing.  Hx Breast CA w/ mets, on oral chemo.  Plan: Will continue vancomycin 500mg  IV Q18hr for goal trough of 15-20. Will obtain trough prior to dose on 10/7. Will continue to monitor renal function with am labs on 10/6.   10/7 1834 vancomycin level 9 mcg/mL. Recalculated Ke 0.058 hr-1, increase to vancomycin 500 mg IV Q12H and recheck before fourth dose.  Will continue cefepime 2g IV Q12hr.   Height: 5\' 2"  (157.5 cm) Weight: 93 lb 11.1 oz (42.5 kg) IBW/kg (Calculated) : 50.1  Temp (24hrs), Avg:97.5 F (36.4 C), Min:97.4 F (36.3 C), Max:97.8 F (36.6 C)   Recent Labs Lab 08/11/17 1240 08/12/17 0431 08/13/17 0622 08/14/17 0433 08/14/17 1834  WBC 6.1 13.0*  --  10.7  --   CREATININE 0.57 0.68 0.59 0.63  --   LATICACIDVEN 1.0  --   --   --   --   VANCOTROUGH  --   --   --   --  9*    Estimated Creatinine Clearance: 57.7 mL/min (by C-G formula based on SCr of 0.63 mg/dL).    Allergies  Allergen Reactions  . Codeine Nausea And Vomiting  . Penicillins Rash    Has patient had a PCN reaction causing immediate rash, facial/tongue/throat swelling, SOB or lightheadedness with hypotension: Unknown Has patient had a PCN reaction causing severe rash involving mucus membranes or skin necrosis: No Has patient had a PCN reaction that required hospitalization: No Has patient had a PCN reaction occurring within the last 10 years: Unknown If all of the above answers are "NO", then may proceed with Cephalosporin use.     Antimicrobials this admission: Aztreonam x 1  10/4  Levaquin x 1 10/4 Vancomycin 10/4 >> Cefepime 10/4 >>   Dose adjustments this admission: N/A  Microbiology results: 10/4 BCx: no growth < 12 hours  10/4 UCx: no growth  10/4 MRSA PCR:  Negative    Thank you for allowing pharmacy to be a part of this  patient's care.  Laural Benes 08/14/2017 7:44 PM

## 2017-08-14 NOTE — Progress Notes (Signed)
Dr Jefferson Fuel advised that pt vomited her meds back up this AM, including the midodrine he started.  Pt also vomited after just a few bites of lunch.  Zofran given.  Dr Jefferson Fuel stated to DC heparin SQ for patient's comfort.

## 2017-08-14 NOTE — Progress Notes (Signed)
[facility] Progess Note    SYNOPSIS   Metastatic breast cancer with cachexia, now with hypotension, hypothermia, electrolyte abnormalities.    ASSESSMENT/PLAN   Hypotension, septic shock. Has received fluid resuscitation, presently OFF levophed. Continue cefepime and vancomycin. Patient is complaining of abdominal discomfort. CT scan of her abdomen revealed ascites with evidence of widely metastatic breast cancer. Patient is aware. We'll try additional fluid resuscitation today, goal mean arterial pressure 60 or greater.  Elevated liver function tests. Predominantly cholestatic with elevated alkaline phosphatase and total bilirubin. Metastatic sites include uterus and ovarian, status post mastectomy and hysterectomy with metastasis to bones. Has had recurrent ascites, status post paracentesis.  Electrolyte abnormality. Sodium has been slowly increasing now 126, potassium 3.6, received a dose of albumin and magnesium yesterday, will recheck magnesium this morning  Leukocytosis.  Most likely reflects sepsis. Presently on broad-spectrum coverage to include vancomycin and cefepime  Anemia. No evidence of active bleeding  Mild troponin elevation. Most likely reflects supply demand ischemia  Stable for floor transfer  Name: [name] MRN: [id number] DOB: 11-Dec-1970    ADMISSION DATE:  08/11/2017  SUBJECTIVE:  No acute issues overnight. Patient was very anxious and tearful about prognosis  VITAL SIGNS: Temp:  [97.6 F (36.4 C)-98.1 F (36.7 C)] 97.6 F (36.4 C) (10/07 0200) Pulse Rate:  [65-78] 71 (10/07 0600) Resp:  [10-19] 15 (10/07 0600) BP: (74-110)/(58-88) 110/88 (10/07 0600) SpO2:  [95 %-100 %] 98 % (10/07 0600)   Physical Examination:   VS: BP 110/88   Pulse 71   Temp 97.6 F (36.4 C) (Oral)   Resp 15   Ht 5\' 2"  (1.575 m)   Wt 93 lb 11.1 oz (42.5 kg)   SpO2 98%   BMI 17.14 kg/m   General Appearance: No distress  Neuro:without  focal findings, mental status normal. Pulmonary: normal breath sounds   Cardiovascular irregularly irregular rhythm control ventricular response  Abdomen: Positive bowel sounds appreciated, generalized soft exam, some pain noted suprapubic Skin:   warm, no rashes, no ecchymosis  Extremities: normal, no cyanosis, clubbing.    LABORATORY PANEL:   CBC  Recent Labs Lab 08/14/17 0433  WBC 10.7  HGB 9.3*  HCT 26.6*  PLT 156    Chemistries   Recent Labs Lab 08/11/17 1240  08/14/17 0433  NA 123*  < > 130*  K 3.3*  < > 4.2  CL 89*  < > 97*  CO2 22  < > 22  GLUCOSE 66  < > 122*  BUN 14  < > 9  CREATININE 0.57  < > 0.63  CALCIUM 7.6*  < > 6.9*  MG  --   < > 2.2  PHOS  --   --  2.2*  AST 131*  --   --   ALT 31  --   --   ALKPHOS 424*  --   --   BILITOT 2.6*  --   --   < > = values in this interval not displayed.   Recent Labs Lab 08/11/17 1606  GLUCAP 65   No results for input(s): PHART, PCO2ART, PO2ART in the last 168 hours.  Recent Labs Lab 08/11/17 1240 08/12/17 0431  AST 131*  --   ALT 31  --   ALKPHOS 424*  --   BILITOT 2.6*  --   ALBUMIN 2.7* 2.0*    Cardiac Enzymes  Recent Labs Lab 08/11/17 1240  TROPONINI 0.03*    RADIOLOGY:  Ct Abdomen  Pelvis Wo Contrast  Result Date: 08/12/2017 CLINICAL DATA:  Hypotension.  Septic shock. EXAM: CT ABDOMEN AND PELVIS WITHOUT CONTRAST TECHNIQUE: Multidetector CT imaging of the abdomen and pelvis was performed following the standard protocol without IV contrast. COMPARISON:  06/21/2017 FINDINGS: Lower chest: Small bilateral pleural effusions. Dependent atelectasis. Esophagus is mildly distended. Hepatobiliary: Several ill-defined low-density lesions are scattered throughout the liver worrisome for metastatic disease. Gallbladder is decompressed. Pancreas: Unremarkable Spleen: Calcified granulomata. Adrenals/Urinary Tract: Kidneys and adrenal glands are unremarkable. Bladder is decompressed by a Foley catheter  Stomach/Bowel: Stomach is unremarkable. Enteric contrast is present throughout the colon. Colon is relatively decompressed. No gas and fluid filled loops of small bowel are noted. Vascular/Lymphatic: Extensive atherosclerotic calcifications of the aorta and iliac arteries. No evidence of aortic aneurysm. Reproductive: Uterus is absent.  Adnexa and not clearly visualized. Other: Large amount of ascites is present. The posterior peritoneum Ms. thickened in the dependent portion of the pelvis worrisome for peritoneal implants. There is no evidence of omental caking. Musculoskeletal: Scattered tiny sclerotic lesions are seen throughout the visualized spine and pelvis. No vertebral compression deformity IMPRESSION: Advanced metastatic disease is suggested throughout the liver. This should be confirmed with a contrast-enhanced study Prominent ascites. 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 Bilateral pleural effusions with dependent atelectasis. Electronically Signed   By: Marybelle Killings M.D.   On: 08/12/2017 11:29    Magdalene S. [facility] ANP-BC Pulmonary and Critical Care Medicine [facility] Pager [phone] or [phone] 08/14/2017

## 2017-08-15 ENCOUNTER — Telehealth: Payer: Self-pay | Admitting: *Deleted

## 2017-08-15 MED ORDER — LORAZEPAM 0.5 MG PO TABS: 0.5000 mg | ORAL_TABLET | Freq: Four times a day (QID) | ORAL | 0 refills | Status: AC | PRN

## 2017-08-15 MED ORDER — MORPHINE SULFATE (CONCENTRATE) 10 MG /0.5 ML PO SOLN: 10.0000 mg | ORAL | 0 refills | Status: AC | PRN

## 2017-08-15 MED ORDER — HEPARIN SOD (PORK) LOCK FLUSH 100 UNIT/ML IV SOLN
500.0000 [IU] | Freq: Once | INTRAVENOUS | Status: AC
  Administered 2017-08-15: 12:00:00 500 [IU] via INTRAVENOUS
  Filled 2017-08-15: qty 5

## 2017-08-15 MED ORDER — ONDANSETRON 8 MG PO TBDP: 8.0000 mg | ORAL_TABLET | Freq: Three times a day (TID) | ORAL | 0 refills | Status: AC | PRN

## 2017-08-15 MED ORDER — MIDODRINE HCL 5 MG PO TABS: 5.0000 mg | ORAL_TABLET | Freq: Three times a day (TID) | ORAL | 0 refills | Status: AC

## 2017-08-15 MED ORDER — SENNA 8.6 MG PO TABS
2.0000 | ORAL_TABLET | Freq: Every day | ORAL | 0 refills | Status: AC
Start: 2017-08-15 — End: ?

## 2017-08-15 NOTE — Discharge Instructions (Signed)
Diet and activity as tolerated.

## 2017-08-15 NOTE — Progress Notes (Signed)
Visit made to new referral received this weekend for [facility] services at home. Patient seen sitting up in bed, daughters at bedside. Plan is for discharge home via car today. Hospice bed has been ordered. Patient was alert, some what drowsy from a dose of PRN ativan given during visit by staff RN Dedra. Questions answered, hospice information and contact numbers given to patient's daughter Crystal. Updated notes faxed to referral. Hospital updated. Thank you. Flo Shanks RN, BSN, [facility] [place], hospital liaison [phone] c

## 2017-08-15 NOTE — Telephone Encounter (Signed)
Yes

## 2017-08-15 NOTE — Telephone Encounter (Signed)
Hospice informed  

## 2017-08-15 NOTE — Care Management (Signed)
This RNCM spoke with Santiago Glad with [facility]  after her daughter Donella Stade contacted me about follow up with hospice. Santiago Glad updated on patient request for hospital bed.

## 2017-08-15 NOTE — Telephone Encounter (Signed)
t discharging home and wants hospice services, Will you sign orders?

## 2017-08-15 NOTE — Progress Notes (Signed)
Patient discharged home with hospice. Prescriptions given to daughters and all discharge instructions given. All questions answered. DNR paperwork given.

## 2017-08-15 NOTE — Care Management (Addendum)
[name] with [facility] aware of patient discharge. Hospital bed ordered.through [facility] and will call daughter Crystal when ready to deliver. I met with Crystal and she states that patient can rest on lounge chair at their home- they will not need to wait on hospital bed delivery. I have notified RN. No other RNCM needs.

## 2017-08-15 NOTE — Progress Notes (Signed)
Advance care planning  Net. Patient with her daughter at bedside. We discussed regarding patient's breast cancer with wide metastasis and malignant ascites. Reoccurrence of the ascites in spite of paracentesis. At this time patient is wanting to go home with hospice services. We discussed regarding her CODE STATUS being DO NOT RESUSCITATE. Advised that recurrent paracentesis would mean trips back and forth to the hospital which patient does not want. She agrees that the ascites get worse and she gets uncomfortable she will use the Roxanol for pain management.  Cancer not amenable to treatment.  Time spent 20 minutes

## 2017-08-15 NOTE — Discharge Summary (Signed)
[place] [facility] [place] NAME: [name]    MR#:  [id number]  DATE OF BIRTH:  1971-07-17  DATE OF ADMISSION:  08/11/2017 ADMITTING PHYSICIAN: Vaughan Basta, MD  DATE OF DISCHARGE: 08/15/2017  1:20 PM  PRIMARY CARE PHYSICIAN: [facility]   ADMISSION DIAGNOSIS:  Hyponatremia [E87.1] Failure to thrive in adult [R62.7] Septic shock (HCC) [A41.9, R65.21] Sepsis ([place]) [A41.9]  DISCHARGE DIAGNOSIS:  Principal Problem:   Failure to thrive in adult Active Problems:   Hypothermia   Hyponatremia   Hypokalemia   Sepsis ([place])   Metastatic breast cancer ([place])   Palliative care encounter   Protein-calorie malnutrition, severe   SECONDARY DIAGNOSIS:   Past Medical History:  Diagnosis Date  . Asthma 2005  . Breast cancer ([place])   . CAD (coronary artery disease)   . Cancer [facility]) 2012   breast cancer, right-treated with neoadjunvant chemotherapy followed by partial mastectomy with sn bx and radiation. Pt is currently taking Tamoxifen since January 2013   . Hypertension 2012  . Lump or mass in breast 2012   right  . Malignant neoplasm of upper-outer quadrant of female breast ([place]) 2012   right  . MI (myocardial infarction) ([place])   . Myocardial infarction ([place]) 10/2012  . Personal history of tobacco use, presenting hazards to health      ADMITTING HISTORY  HISTORY OF PRESENT ILLNESS: [name]  is a 46 y.o. 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 Worsening abd distension and pain- sent for Ascites tap last week- 5 ltr fluid removed, since then- have more pain, nausea, not eating any, have chills- finally brought to ER. Noted to be dehydrated, hypokalemia, hyponatremia, Hypothermia.  ER called for admitting her for these.  HOSPITAL COURSE:   BobbieBondis a 46 y.o.femalewith a known  history of Breast cancer, spread to Uterus and OVeries, s/p mastectomy, Hysterectomy, Mets to bones as per daughter- on oral chemo agent. Worsening abd distension and pain- sent for Ascites tap last week- 5 ltr fluid removed, since then- have more pain, nausea, not eating any, have chills  * Hypotension. With associated hypothermia patient was on IV pressors, antibiotics for possible septic shock. Cultures are no growth to date. Could be due to metastatic disease and significant weight loss. Levophed stopped. Patient started on Midodrin. Antibiotics have been stopped.  * Failure to thrive due toMetastatic breast cancer/severe protein calorie malnutrition IV fluids, nausea and pain management Oncology and Palliative care consult to help with management.  * Hypokalemia replaced  * Hyponatremia Due to dehydration  * Pain and nausea with cancer On Roxanol at discharge Hospice has been set up  * Malignant ascites. This will be a recurrent problem. Patient does not want a peritoneal catheter placed. We discussed regarding monitoring this and using pain medications. . She would prefer staying home than having Hospital and clinic visits. Advised increasing pain medications ascites related discomfort becomes a problem.  Patient has very poor prognosis. Is being discharged home with hospice. Prescriptions given for Roxanol and Ativan.  CONSULTS OBTAINED:  Treatment Team:  Lloyd Huger, MD  DRUG ALLERGIES:   Allergies  Allergen Reactions  . Codeine Nausea And Vomiting  . Penicillins Rash    Has patient had a PCN reaction causing immediate rash, facial/tongue/throat swelling, SOB or lightheadedness with hypotension: Unknown Has patient had a  PCN reaction causing severe rash involving mucus membranes or skin necrosis: No Has patient had a PCN reaction that required hospitalization: No Has patient had a PCN reaction occurring within the last 10 years: Unknown If all of  the above answers are "NO", then may proceed with Cephalosporin use.     DISCHARGE MEDICATIONS:   Discharge Medication List as of 08/15/2017 12:21 PM    START taking these medications   Details  LORazepam (ATIVAN) 0.5 MG tablet Take 1 tablet (0.5 mg total) by mouth every 6 (six) hours as needed for anxiety., Starting Mon 08/15/2017, Until Tue 08/15/2018, Print    midodrine (PROAMATINE) 5 MG tablet Take 1 tablet (5 mg total) by mouth 3 (three) times daily with meals., Starting Mon 08/15/2017, Print    Morphine Sulfate (MORPHINE CONCENTRATE) 10 mg / 0.5 ml concentrated solution Take 0.5 mLs (10 mg total) by mouth every 3 (three) hours as needed for severe pain., Starting Mon 08/15/2017, Print    senna (SENOKOT) 8.6 MG TABS tablet Take 2 tablets (17.2 mg total) by mouth daily., Starting Mon 08/15/2017, Print      CONTINUE these medications which have CHANGED   Details  ondansetron (ZOFRAN ODT) 8 MG disintegrating tablet Take 1 tablet (8 mg total) by mouth every 8 (eight) hours as needed for nausea or vomiting., Starting Mon 08/15/2017, Print      CONTINUE these medications which have NOT CHANGED   Details  albuterol (PROVENTIL HFA;VENTOLIN HFA) 108 (90 Base) MCG/ACT inhaler Inhale 2 puffs into the lungs every 6 (six) hours as needed for wheezing or shortness of breath., Starting Thu 06/03/2016, Normal    letrozole (FEMARA) 2.5 MG tablet Take 1 tablet (2.5 mg total) by mouth daily., Starting Fri 07/29/2017, Normal    prochlorperazine (COMPAZINE) 10 MG tablet Take 1 tablet (10 mg total) by mouth every 6 (six) hours as needed for nausea or vomiting., Starting Fri 04/16/2016, Normal      STOP taking these medications     aspirin EC 81 MG EC tablet      dexamethasone (DECADRON) 4 MG tablet      nitroGLYCERIN (NITROSTAT) 0.4 MG SL tablet      oxyCODONE-acetaminophen (PERCOCET/ROXICET) 5-325 MG tablet      palbociclib (IBRANCE) 100 MG capsule      potassium chloride SA (K-DUR,KLOR-CON) 20 MEQ  tablet         Today   VITAL SIGNS:  Blood pressure 96/70, pulse 82, temperature 98 F (36.7 C), resp. rate 18, height 5\' 2"  (1.575 m), weight 42.5 kg (93 lb 11.1 oz), SpO2 98 %.  I/O:   Intake/Output Summary (Last 24 hours) at 08/15/17 1349 Last data filed at 08/15/17 1110  Gross per 24 hour  Intake              250 ml  Output               50 ml  Net              200 ml    PHYSICAL EXAMINATION:  Physical Exam  GENERAL:  46 y.o.-year-old patient lying in the bed with no acute distress.  LUNGS: Normal breath sounds bilaterally, no wheezing, rales,rhonchi or crepitation. No use of accessory muscles of respiration.  CARDIOVASCULAR: S1, S2 normal. No murmurs, rubs, or gallops.  ABDOMEN: Soft, non-tender, distended. Bowel sounds present. No organomegaly or mass.  NEUROLOGIC: Moves all 4 extremities. PSYCHIATRIC: The patient is alert SKIN: No obvious rash, lesion, or ulcer.  DATA REVIEW:   CBC  Recent Labs Lab 08/14/17 0433  WBC 10.7  HGB 9.3*  HCT 26.6*  PLT 156    Chemistries   Recent Labs Lab 08/11/17 1240  08/14/17 0433  NA 123*  < > 130*  K 3.3*  < > 4.2  CL 89*  < > 97*  CO2 22  < > 22  GLUCOSE 66  < > 122*  BUN 14  < > 9  CREATININE 0.57  < > 0.63  CALCIUM 7.6*  < > 6.9*  MG  --   < > 2.2  AST 131*  --   --   ALT 31  --   --   ALKPHOS 424*  --   --   BILITOT 2.6*  --   --   < > = values in this interval not displayed.  Cardiac Enzymes  Recent Labs Lab 08/11/17 1240  TROPONINI 0.03*    Microbiology Results  Results for orders placed or performed during the hospital encounter of 08/11/17  Urine culture     Status: None   Collection Time: 08/11/17 12:25 PM  Result Value Ref Range Status   Specimen Description URINE, RANDOM  Final   Special Requests NONE  Final   Culture   Final    NO GROWTH Performed at [facility], [place] [address], [place], [place] [address]    Report Status 08/12/2017 FINAL  Final  Blood Culture (routine x  2)     Status: None (Preliminary result)   Collection Time: 08/11/17 12:40 PM  Result Value Ref Range Status   Specimen Description BLOOD LEFT ANTECUBITAL  Final   Special Requests   Final    BOTTLES DRAWN AEROBIC AND ANAEROBIC Blood Culture adequate volume   Culture NO GROWTH 4 DAYS  Final   Report Status PENDING  Incomplete  Blood Culture (routine x 2)     Status: None (Preliminary result)   Collection Time: 08/11/17 12:40 PM  Result Value Ref Range Status   Specimen Description BLOOD RIGHT PORT  Final   Special Requests   Final    BOTTLES DRAWN AEROBIC AND ANAEROBIC Blood Culture adequate volume   Culture NO GROWTH 4 DAYS  Final   Report Status PENDING  Incomplete  MRSA PCR Screening     Status: None   Collection Time: 08/11/17  5:19 PM  Result Value Ref Range Status   MRSA by PCR NEGATIVE NEGATIVE Final    Comment:        The GeneXpert MRSA Assay (FDA approved for NASAL specimens only), is one component of a comprehensive MRSA colonization surveillance program. It is not intended to diagnose MRSA infection nor to guide or monitor treatment for MRSA infections.     RADIOLOGY:  No results found.  Follow up with PCP in 1 week.  Management plans discussed with the patient, family and they are in agreement.  CODE STATUS:     Code Status Orders        Start     Ordered   08/11/17 1617  Do not attempt resuscitation (DNR)  Continuous    Question Answer Comment  In the event of cardiac or respiratory ARREST Do not call a "code blue"   In the event of cardiac or respiratory ARREST Do not perform Intubation, CPR, defibrillation or ACLS   In the event of cardiac or respiratory ARREST Use medication by any route, position, wound care, and other measures to relive pain and  suffering. May use oxygen, suction and manual treatment of airway obstruction as needed for comfort.      08/11/17 1617    Code Status History    Date Active Date Inactive Code Status Order ID  Comments User Context   01/23/2015  6:29 PM 01/27/2015  4:05 PM Full Code [id number]  Doree Albee, MD ED    Advance Directive Documentation     Most Recent Value  Type of Advance Directive  Out of facility DNR (pink MOST or yellow form)  Pre-existing out of facility DNR order (yellow form or pink MOST form)  Yellow form placed in chart (order not valid for inpatient use)  "MOST" Form in Place?  -      TOTAL TIME TAKING CARE OF THIS PATIENT ON DAY OF DISCHARGE: more than 30 minutes.   Hillary Bow R M.D on 08/15/2017 at 1:49 PM  Between 7am to 6pm - Pager - [phone]  After 6pm go to [email or web address] - password [email or web address] [facility]  Office  [phone]  CC: Primary care physician; [facility]  Note: This dictation was prepared with Dragon dictation along with smaller phrase technology. Any transcriptional errors that result from this process are unintentional.

## 2017-08-16 LAB — CULTURE, BLOOD (ROUTINE X 2)
Culture: NO GROWTH
Culture: NO GROWTH
SPECIAL REQUESTS: ADEQUATE
Special Requests: ADEQUATE

## 2017-08-19 ENCOUNTER — Other Ambulatory Visit: Payer: Self-pay | Admitting: Nurse Practitioner

## 2017-08-22 LAB — BLOOD GAS, VENOUS
ACID-BASE DEFICIT: 1 mmol/L (ref 0.0–2.0)
BICARBONATE: 25.3 mmol/L (ref 20.0–28.0)
FIO2: 0.21
PATIENT TEMPERATURE: 37
pCO2, Ven: 48 mmHg (ref 44.0–60.0)
pH, Ven: 7.33 (ref 7.250–7.430)

## 2017-08-26 ENCOUNTER — Ambulatory Visit: Payer: Medicaid Other

## 2017-08-26 ENCOUNTER — Other Ambulatory Visit: Payer: Medicaid Other

## 2017-08-26 ENCOUNTER — Ambulatory Visit: Payer: Medicaid Other | Admitting: Oncology

## 2017-09-08 DEATH — deceased

## 2018-05-21 IMAGING — NM NM BONE WHOLE BODY
2 series · 2 of 2 positions shown · non-contrast
Comparison: None.

CLINICAL DATA: Breast cancer, restaging

EXAM:
NUCLEAR MEDICINE WHOLE BODY BONE SCAN
TECHNIQUE: Whole body anterior and posterior images were obtained approximately
3 hours after intravenous injection of radiopharmaceutical.
RADIOPHARMACEUTICALS:  26.3 mCi Oechnetium-EEm MDP IV

[Series 1: whole body · 2.66mm/px · 1 of 1 slices shown (1 of 2)]
[im 1/1]
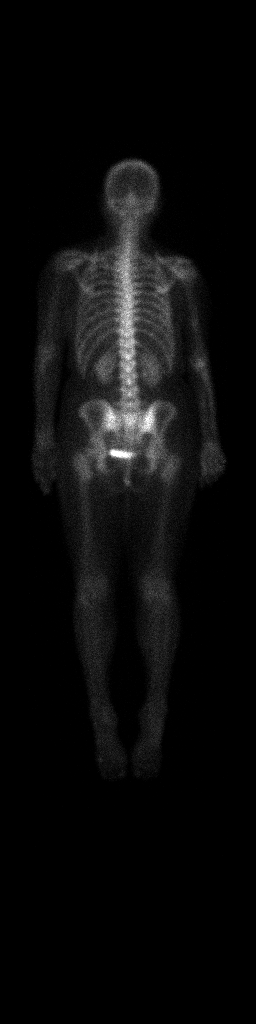

[Series 1: whole body · 2.66mm/px · 1 of 1 slices shown (2 of 2)]
[im 1/1]
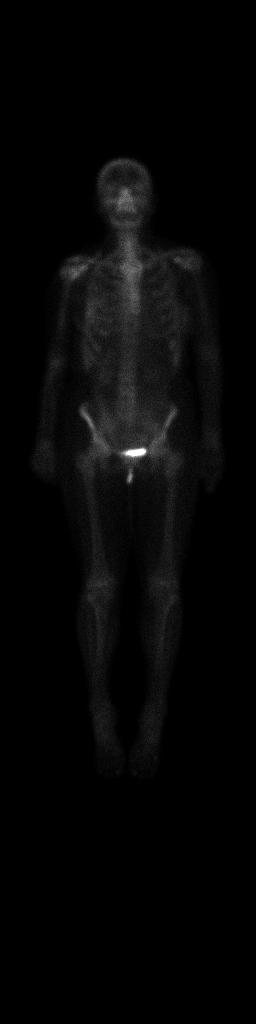

[2 of 2 positions shown; findings below may reference images not displayed]

FINDINGS: No areas of suspicious bony uptake to suggest metastatic disease.
Soft tissue activity is normal.
IMPRESSION: No evidence of osseous metastatic disease.
